# Patient Record
Sex: Male | Born: 1960 | Race: White | Hispanic: No | Marital: Married | Smoking: Never smoker
Health system: Southern US, Community
[De-identification: ages and names within clinical notes are randomized; demographics above are authoritative.]

## PROBLEM LIST (undated history)

## (undated) DIAGNOSIS — N1330 Unspecified hydronephrosis: Secondary | ICD-10-CM

## (undated) DIAGNOSIS — R002 Palpitations: Secondary | ICD-10-CM

## (undated) DIAGNOSIS — B379 Candidiasis, unspecified: Secondary | ICD-10-CM

## (undated) DIAGNOSIS — K219 Gastro-esophageal reflux disease without esophagitis: Secondary | ICD-10-CM

## (undated) DIAGNOSIS — R0600 Dyspnea, unspecified: Secondary | ICD-10-CM

## (undated) DIAGNOSIS — E349 Endocrine disorder, unspecified: Secondary | ICD-10-CM

## (undated) DIAGNOSIS — T7840XA Allergy, unspecified, initial encounter: Secondary | ICD-10-CM

## (undated) DIAGNOSIS — I10 Essential (primary) hypertension: Secondary | ICD-10-CM

## (undated) DIAGNOSIS — N200 Calculus of kidney: Secondary | ICD-10-CM

## (undated) DIAGNOSIS — E669 Obesity, unspecified: Secondary | ICD-10-CM

## (undated) DIAGNOSIS — G4730 Sleep apnea, unspecified: Secondary | ICD-10-CM

## (undated) DIAGNOSIS — I517 Cardiomegaly: Secondary | ICD-10-CM

## (undated) DIAGNOSIS — N529 Male erectile dysfunction, unspecified: Secondary | ICD-10-CM

## (undated) DIAGNOSIS — E785 Hyperlipidemia, unspecified: Secondary | ICD-10-CM

## (undated) DIAGNOSIS — N400 Enlarged prostate without lower urinary tract symptoms: Secondary | ICD-10-CM

## (undated) DIAGNOSIS — A6000 Herpesviral infection of urogenital system, unspecified: Secondary | ICD-10-CM

## (undated) DIAGNOSIS — Z9989 Dependence on other enabling machines and devices: Secondary | ICD-10-CM

## (undated) HISTORY — DX: Allergy, unspecified, initial encounter: T78.40XA

## (undated) HISTORY — DX: Essential (primary) hypertension: I10

## (undated) HISTORY — DX: Obesity, unspecified: E66.9

## (undated) HISTORY — DX: Sleep apnea, unspecified: G47.30

## (undated) HISTORY — DX: Male erectile dysfunction, unspecified: N52.9

## (undated) HISTORY — DX: Cardiomegaly: I51.7

## (undated) HISTORY — DX: Hyperlipidemia, unspecified: E78.5

## (undated) HISTORY — DX: Dyspnea, unspecified: R06.00

## (undated) HISTORY — DX: Benign prostatic hyperplasia without lower urinary tract symptoms: N40.0

## (undated) HISTORY — DX: Calculus of kidney: N20.0

## (undated) HISTORY — DX: Unspecified hydronephrosis: N13.30

## (undated) HISTORY — DX: Candidiasis, unspecified: B37.9

## (undated) HISTORY — DX: Dependence on other enabling machines and devices: Z99.89

## (undated) HISTORY — PX: CIRCUMCISION: SUR203

## (undated) HISTORY — PX: NASAL SEPTUM SURGERY: SHX37

## (undated) HISTORY — DX: Herpesviral infection of urogenital system, unspecified: A60.00

## (undated) HISTORY — DX: Palpitations: R00.2

## (undated) HISTORY — DX: Gastro-esophageal reflux disease without esophagitis: K21.9

## (undated) HISTORY — DX: Endocrine disorder, unspecified: E34.9

---

## 1999-02-01 ENCOUNTER — Ambulatory Visit (HOSPITAL_BASED_OUTPATIENT_CLINIC_OR_DEPARTMENT_OTHER): Admission: RE | Admit: 1999-02-01 | Discharge: 1999-02-01 | Payer: Self-pay | Admitting: Otolaryngology

## 2004-05-10 ENCOUNTER — Ambulatory Visit: Payer: Self-pay | Admitting: Internal Medicine

## 2004-06-18 ENCOUNTER — Ambulatory Visit: Payer: Self-pay | Admitting: Internal Medicine

## 2004-07-16 ENCOUNTER — Ambulatory Visit: Payer: Self-pay | Admitting: Internal Medicine

## 2004-11-04 ENCOUNTER — Ambulatory Visit: Payer: Self-pay | Admitting: Internal Medicine

## 2005-01-28 ENCOUNTER — Ambulatory Visit: Payer: Self-pay | Admitting: Internal Medicine

## 2005-02-04 ENCOUNTER — Ambulatory Visit: Payer: Self-pay | Admitting: Internal Medicine

## 2005-07-03 ENCOUNTER — Ambulatory Visit: Payer: Self-pay | Admitting: Internal Medicine

## 2005-08-25 ENCOUNTER — Ambulatory Visit: Payer: Self-pay | Admitting: Internal Medicine

## 2005-09-09 ENCOUNTER — Ambulatory Visit: Payer: Self-pay | Admitting: Internal Medicine

## 2005-09-16 ENCOUNTER — Ambulatory Visit: Payer: Self-pay | Admitting: Internal Medicine

## 2005-10-01 ENCOUNTER — Ambulatory Visit: Payer: Self-pay | Admitting: Gastroenterology

## 2005-10-13 ENCOUNTER — Ambulatory Visit: Payer: Self-pay | Admitting: Gastroenterology

## 2005-11-21 ENCOUNTER — Encounter: Admission: RE | Admit: 2005-11-21 | Discharge: 2005-12-22 | Payer: Self-pay | Admitting: Internal Medicine

## 2006-02-11 ENCOUNTER — Ambulatory Visit: Payer: Self-pay | Admitting: Internal Medicine

## 2006-02-11 LAB — CONVERTED CEMR LAB
Chol/HDL Ratio, serum: 7.5
Cholesterol: 217 mg/dL (ref 0–200)
HDL: 29.1 mg/dL — ABNORMAL LOW (ref >39.0)
LDL DIRECT: 147.3 mg/dL
Triglyceride fasting, serum: 165 mg/dL — ABNORMAL HIGH (ref 0–149)
VLDL: 33 mg/dL (ref 0–40)

## 2006-02-18 ENCOUNTER — Ambulatory Visit: Payer: Self-pay | Admitting: Internal Medicine

## 2006-04-08 ENCOUNTER — Ambulatory Visit: Payer: Self-pay | Admitting: Internal Medicine

## 2006-04-15 ENCOUNTER — Ambulatory Visit: Payer: Self-pay | Admitting: Internal Medicine

## 2006-04-15 LAB — CONVERTED CEMR LAB
ALT: 52 units/L — ABNORMAL HIGH (ref 0–40)
AST: 31 units/L (ref 0–37)
Albumin: 4.1 g/dL (ref 3.5–5.2)
Alkaline Phosphatase: 65 units/L (ref 39–117)
Bilirubin, Direct: 0.2 mg/dL (ref 0.0–0.3)
Chol/HDL Ratio, serum: 8
Cholesterol: 256 mg/dL (ref 0–200)
HDL: 32.2 mg/dL — ABNORMAL LOW (ref >39.0)
LDL DIRECT: 218.4 mg/dL
Total Bilirubin: 0.8 mg/dL (ref 0.3–1.2)
Total Protein: 7.5 g/dL (ref 6.0–8.3)
Triglyceride fasting, serum: 126 mg/dL (ref 0–149)
VLDL: 25 mg/dL (ref 0–40)

## 2006-04-23 ENCOUNTER — Ambulatory Visit: Payer: Self-pay | Admitting: Internal Medicine

## 2006-04-28 LAB — HM COLONOSCOPY

## 2006-09-17 ENCOUNTER — Ambulatory Visit: Payer: Self-pay | Admitting: Internal Medicine

## 2006-09-17 LAB — CONVERTED CEMR LAB
ALT: 47 units/L — ABNORMAL HIGH (ref 0–40)
AST: 28 units/L (ref 0–37)
Albumin: 4 g/dL (ref 3.5–5.2)
Alkaline Phosphatase: 94 units/L (ref 39–117)
Bilirubin, Direct: 0.2 mg/dL (ref 0.0–0.3)
Cholesterol: 234 mg/dL (ref 0–200)
Direct LDL: 151 mg/dL
HDL: 30.4 mg/dL — ABNORMAL LOW (ref >39.0)
Total Bilirubin: 0.7 mg/dL (ref 0.3–1.2)
Total CHOL/HDL Ratio: 7.7
Total Protein: 7.4 g/dL (ref 6.0–8.3)
Triglycerides: 293 mg/dL (ref 0–149)
VLDL: 59 mg/dL — ABNORMAL HIGH (ref 0–40)

## 2006-09-23 DIAGNOSIS — N400 Enlarged prostate without lower urinary tract symptoms: Secondary | ICD-10-CM | POA: Insufficient documentation

## 2006-09-23 DIAGNOSIS — E785 Hyperlipidemia, unspecified: Secondary | ICD-10-CM | POA: Insufficient documentation

## 2006-10-19 ENCOUNTER — Ambulatory Visit: Payer: Self-pay | Admitting: Internal Medicine

## 2007-03-10 ENCOUNTER — Ambulatory Visit: Payer: Self-pay | Admitting: Internal Medicine

## 2007-03-10 LAB — CONVERTED CEMR LAB
ALT: 46 units/L (ref 0–53)
AST: 32 units/L (ref 0–37)
Albumin: 4.2 g/dL (ref 3.5–5.2)
Alkaline Phosphatase: 83 units/L (ref 39–117)
Bilirubin, Direct: 0.2 mg/dL (ref 0.0–0.3)
Cholesterol: 242 mg/dL (ref 0–200)
Direct LDL: 184.4 mg/dL
HDL: 29.5 mg/dL — ABNORMAL LOW (ref >39.0)
Total Bilirubin: 1.1 mg/dL (ref 0.3–1.2)
Total CHOL/HDL Ratio: 8.2
Total Protein: 7.7 g/dL (ref 6.0–8.3)
Triglycerides: 184 mg/dL — ABNORMAL HIGH (ref 0–149)
VLDL: 37 mg/dL (ref 0–40)

## 2007-03-17 ENCOUNTER — Ambulatory Visit: Payer: Self-pay | Admitting: Internal Medicine

## 2007-03-17 LAB — CONVERTED CEMR LAB
Cholesterol, target level: 200 mg/dL
HDL goal, serum: 40 mg/dL
LDL Goal: 130 mg/dL

## 2007-04-14 ENCOUNTER — Ambulatory Visit: Payer: Self-pay | Admitting: Internal Medicine

## 2007-04-14 DIAGNOSIS — J069 Acute upper respiratory infection, unspecified: Secondary | ICD-10-CM | POA: Insufficient documentation

## 2007-06-18 ENCOUNTER — Ambulatory Visit: Payer: Self-pay | Admitting: Internal Medicine

## 2007-06-18 LAB — CONVERTED CEMR LAB
ALT: 47 units/L (ref 0–53)
AST: 28 units/L (ref 0–37)
Albumin: 4.1 g/dL (ref 3.5–5.2)
Alkaline Phosphatase: 86 units/L (ref 39–117)
Bilirubin, Direct: 0.2 mg/dL (ref 0.0–0.3)
Cholesterol: 133 mg/dL (ref 0–200)
HDL: 27.1 mg/dL — ABNORMAL LOW (ref >39.0)
LDL Cholesterol: 84 mg/dL (ref 0–99)
Total Bilirubin: 0.9 mg/dL (ref 0.3–1.2)
Total CHOL/HDL Ratio: 4.9
Total Protein: 7.3 g/dL (ref 6.0–8.3)
Triglycerides: 111 mg/dL (ref 0–149)
VLDL: 22 mg/dL (ref 0–40)

## 2007-06-25 ENCOUNTER — Ambulatory Visit: Payer: Self-pay | Admitting: Internal Medicine

## 2007-12-31 ENCOUNTER — Ambulatory Visit: Payer: Self-pay | Admitting: Internal Medicine

## 2007-12-31 LAB — CONVERTED CEMR LAB
ALT: 30 units/L (ref 0–53)
AST: 23 units/L (ref 0–37)
Albumin: 4.2 g/dL (ref 3.5–5.2)
Alkaline Phosphatase: 81 units/L (ref 39–117)
BUN: 14 mg/dL (ref 6–23)
Basophils Absolute: 0.1 10*3/uL (ref 0.0–0.1)
Basophils Relative: 0.9 % (ref 0.0–3.0)
Bilirubin Urine: NEGATIVE
Bilirubin, Direct: 0.1 mg/dL (ref 0.0–0.3)
Blood in Urine, dipstick: NEGATIVE
CO2: 30 meq/L (ref 19–32)
Calcium: 9.3 mg/dL (ref 8.4–10.5)
Chloride: 111 meq/L (ref 96–112)
Cholesterol: 123 mg/dL (ref 0–200)
Creatinine, Ser: 1.2 mg/dL (ref 0.4–1.5)
Eosinophils Absolute: 0.1 10*3/uL (ref 0.0–0.7)
Eosinophils Relative: 2.3 % (ref 0.0–5.0)
GFR calc Af Amer: 83 mL/min
GFR calc non Af Amer: 69 mL/min
Glucose, Bld: 90 mg/dL (ref 70–99)
Glucose, Urine, Semiquant: NEGATIVE
HCT: 43.9 % (ref 39.0–52.0)
HDL: 25.6 mg/dL — ABNORMAL LOW (ref >39.0)
Hemoglobin: 15.5 g/dL (ref 13.0–17.0)
Ketones, urine, test strip: NEGATIVE
LDL Cholesterol: 61 mg/dL (ref 0–99)
Lymphocytes Relative: 24.8 % (ref 12.0–46.0)
MCHC: 35.2 g/dL (ref 30.0–36.0)
MCV: 86.7 fL (ref 78.0–100.0)
Monocytes Absolute: 0.5 10*3/uL (ref 0.1–1.0)
Monocytes Relative: 7.9 % (ref 3.0–12.0)
Neutro Abs: 4 10*3/uL (ref 1.4–7.7)
Neutrophils Relative %: 64.1 % (ref 43.0–77.0)
Nitrite: NEGATIVE
PSA: 0.27 ng/mL (ref 0.10–4.00)
Platelets: 215 10*3/uL (ref 150–400)
Potassium: 4.3 meq/L (ref 3.5–5.1)
Protein, U semiquant: NEGATIVE
RBC: 5.06 M/uL (ref 4.22–5.81)
RDW: 12.3 % (ref 11.5–14.6)
Sodium: 145 meq/L (ref 135–145)
Specific Gravity, Urine: 1.02
TSH: 2.2 microintl units/mL (ref 0.35–5.50)
Total Bilirubin: 0.9 mg/dL (ref 0.3–1.2)
Total CHOL/HDL Ratio: 4.8
Total Protein: 7.6 g/dL (ref 6.0–8.3)
Triglycerides: 180 mg/dL — ABNORMAL HIGH (ref 0–149)
Urobilinogen, UA: 1
VLDL: 36 mg/dL (ref 0–40)
WBC Urine, dipstick: NEGATIVE
WBC: 6.2 10*3/uL (ref 4.5–10.5)
pH: 7

## 2008-01-10 ENCOUNTER — Ambulatory Visit: Payer: Self-pay | Admitting: Internal Medicine

## 2008-01-10 DIAGNOSIS — F528 Other sexual dysfunction not due to a substance or known physiological condition: Secondary | ICD-10-CM | POA: Insufficient documentation

## 2008-01-28 ENCOUNTER — Ambulatory Visit: Payer: Self-pay | Admitting: Internal Medicine

## 2008-01-28 DIAGNOSIS — E291 Testicular hypofunction: Secondary | ICD-10-CM | POA: Insufficient documentation

## 2008-02-01 ENCOUNTER — Ambulatory Visit: Payer: Self-pay | Admitting: Internal Medicine

## 2008-02-15 ENCOUNTER — Ambulatory Visit: Payer: Self-pay | Admitting: Internal Medicine

## 2008-02-29 ENCOUNTER — Ambulatory Visit: Payer: Self-pay | Admitting: Internal Medicine

## 2008-06-19 ENCOUNTER — Ambulatory Visit: Payer: Self-pay | Admitting: Internal Medicine

## 2008-06-19 LAB — CONVERTED CEMR LAB
ALT: 48 units/L (ref 0–53)
AST: 30 units/L (ref 0–37)
Albumin: 4.1 g/dL (ref 3.5–5.2)
Alkaline Phosphatase: 74 units/L (ref 39–117)
Bilirubin, Direct: 0.1 mg/dL (ref 0.0–0.3)
Cholesterol: 153 mg/dL (ref 0–200)
HDL: 27.3 mg/dL — ABNORMAL LOW (ref >39.0)
LDL Cholesterol: 93 mg/dL (ref 0–99)
Testosterone: 425.26 ng/dL (ref 350.00–890)
Total Bilirubin: 0.9 mg/dL (ref 0.3–1.2)
Total CHOL/HDL Ratio: 5.6
Total Protein: 7 g/dL (ref 6.0–8.3)
Triglycerides: 163 mg/dL — ABNORMAL HIGH (ref 0–149)
VLDL: 33 mg/dL (ref 0–40)

## 2008-06-27 ENCOUNTER — Ambulatory Visit: Payer: Self-pay | Admitting: Internal Medicine

## 2008-06-27 DIAGNOSIS — J309 Allergic rhinitis, unspecified: Secondary | ICD-10-CM | POA: Insufficient documentation

## 2008-08-29 ENCOUNTER — Ambulatory Visit: Payer: Self-pay | Admitting: Internal Medicine

## 2008-08-29 DIAGNOSIS — G4730 Sleep apnea, unspecified: Secondary | ICD-10-CM

## 2008-08-29 DIAGNOSIS — G4710 Hypersomnia, unspecified: Secondary | ICD-10-CM | POA: Insufficient documentation

## 2008-10-02 ENCOUNTER — Encounter: Payer: Self-pay | Admitting: Internal Medicine

## 2008-11-08 ENCOUNTER — Ambulatory Visit (HOSPITAL_BASED_OUTPATIENT_CLINIC_OR_DEPARTMENT_OTHER): Admission: RE | Admit: 2008-11-08 | Discharge: 2008-11-08 | Payer: Self-pay | Admitting: Internal Medicine

## 2008-11-08 ENCOUNTER — Encounter: Payer: Self-pay | Admitting: Internal Medicine

## 2008-11-19 ENCOUNTER — Ambulatory Visit: Payer: Self-pay | Admitting: Pulmonary Disease

## 2008-11-27 ENCOUNTER — Ambulatory Visit: Payer: Self-pay | Admitting: Internal Medicine

## 2008-11-27 DIAGNOSIS — A6000 Herpesviral infection of urogenital system, unspecified: Secondary | ICD-10-CM | POA: Insufficient documentation

## 2008-12-06 ENCOUNTER — Ambulatory Visit: Payer: Self-pay | Admitting: Internal Medicine

## 2008-12-06 LAB — CONVERTED CEMR LAB: Herpes Simplex Vrs I&II-IgM Ab (EIA): 1.66 — ABNORMAL HIGH

## 2008-12-12 ENCOUNTER — Telehealth: Payer: Self-pay | Admitting: Internal Medicine

## 2009-03-15 ENCOUNTER — Ambulatory Visit: Payer: Self-pay | Admitting: Internal Medicine

## 2009-05-25 ENCOUNTER — Ambulatory Visit: Payer: Self-pay | Admitting: Internal Medicine

## 2009-05-25 DIAGNOSIS — J0100 Acute maxillary sinusitis, unspecified: Secondary | ICD-10-CM | POA: Insufficient documentation

## 2009-07-06 ENCOUNTER — Ambulatory Visit: Payer: Self-pay | Admitting: Internal Medicine

## 2009-07-06 DIAGNOSIS — N410 Acute prostatitis: Secondary | ICD-10-CM | POA: Insufficient documentation

## 2009-10-12 ENCOUNTER — Ambulatory Visit: Payer: Self-pay | Admitting: Internal Medicine

## 2009-10-12 DIAGNOSIS — B3749 Other urogenital candidiasis: Secondary | ICD-10-CM | POA: Insufficient documentation

## 2009-11-01 ENCOUNTER — Encounter: Payer: Self-pay | Admitting: Internal Medicine

## 2009-11-28 ENCOUNTER — Telehealth: Payer: Self-pay | Admitting: Internal Medicine

## 2009-11-28 ENCOUNTER — Ambulatory Visit: Payer: Self-pay | Admitting: Internal Medicine

## 2010-01-03 ENCOUNTER — Ambulatory Visit: Payer: Self-pay | Admitting: Internal Medicine

## 2010-01-03 LAB — CONVERTED CEMR LAB
ALT: 39 units/L (ref 0–53)
AST: 27 units/L (ref 0–37)
Albumin: 4.2 g/dL (ref 3.5–5.2)
Alkaline Phosphatase: 72 units/L (ref 39–117)
BUN: 13 mg/dL (ref 6–23)
Basophils Absolute: 0 10*3/uL (ref 0.0–0.1)
Basophils Relative: 0.4 % (ref 0.0–3.0)
Bilirubin Urine: NEGATIVE
Bilirubin, Direct: 0.2 mg/dL (ref 0.0–0.3)
Blood in Urine, dipstick: NEGATIVE
CO2: 28 meq/L (ref 19–32)
Calcium: 9.2 mg/dL (ref 8.4–10.5)
Chloride: 104 meq/L (ref 96–112)
Cholesterol: 170 mg/dL (ref 0–200)
Creatinine, Ser: 1.1 mg/dL (ref 0.4–1.5)
Eosinophils Absolute: 0.2 10*3/uL (ref 0.0–0.7)
Eosinophils Relative: 2.7 % (ref 0.0–5.0)
GFR calc non Af Amer: 79.38 mL/min (ref >60)
Glucose, Bld: 83 mg/dL (ref 70–99)
Glucose, Urine, Semiquant: NEGATIVE
HCT: 48.9 % (ref 39.0–52.0)
HDL: 32 mg/dL — ABNORMAL LOW (ref >39.00)
Hemoglobin: 16.5 g/dL (ref 13.0–17.0)
Ketones, urine, test strip: NEGATIVE
LDL Cholesterol: 101 mg/dL — ABNORMAL HIGH (ref 0–99)
Lymphocytes Relative: 21.4 % (ref 12.0–46.0)
Lymphs Abs: 1.7 10*3/uL (ref 0.7–4.0)
MCHC: 33.7 g/dL (ref 30.0–36.0)
MCV: 90.1 fL (ref 78.0–100.0)
Monocytes Absolute: 0.6 10*3/uL (ref 0.1–1.0)
Monocytes Relative: 7.5 % (ref 3.0–12.0)
Neutro Abs: 5.5 10*3/uL (ref 1.4–7.7)
Neutrophils Relative %: 68 % (ref 43.0–77.0)
Nitrite: NEGATIVE
PSA: 0.35 ng/mL (ref 0.10–4.00)
Platelets: 217 10*3/uL (ref 150.0–400.0)
Potassium: 4.7 meq/L (ref 3.5–5.1)
Protein, U semiquant: NEGATIVE
RBC: 5.43 M/uL (ref 4.22–5.81)
RDW: 14.1 % (ref 11.5–14.6)
Sodium: 142 meq/L (ref 135–145)
Specific Gravity, Urine: 1.025
TSH: 2.75 microintl units/mL (ref 0.35–5.50)
Total Bilirubin: 0.9 mg/dL (ref 0.3–1.2)
Total CHOL/HDL Ratio: 5
Total Protein: 7.2 g/dL (ref 6.0–8.3)
Triglycerides: 185 mg/dL — ABNORMAL HIGH (ref 0.0–149.0)
Urobilinogen, UA: 0.2
VLDL: 37 mg/dL (ref 0.0–40.0)
WBC Urine, dipstick: NEGATIVE
WBC: 8 10*3/uL (ref 4.5–10.5)
pH: 6

## 2010-01-11 ENCOUNTER — Ambulatory Visit: Payer: Self-pay | Admitting: Internal Medicine

## 2010-01-11 DIAGNOSIS — R0989 Other specified symptoms and signs involving the circulatory and respiratory systems: Secondary | ICD-10-CM | POA: Insufficient documentation

## 2010-01-11 DIAGNOSIS — R0609 Other forms of dyspnea: Secondary | ICD-10-CM | POA: Insufficient documentation

## 2010-01-11 LAB — HM COLONOSCOPY

## 2010-01-11 IMAGING — CR DG CHEST 2V
2 series · 2 of 2 positions shown · non-contrast
Comparison: None.

CLINICAL DATA: Dyspnea on exertion.  Nonsmoker

CHEST - 2 VIEW

[view not recorded (1 of 2)]
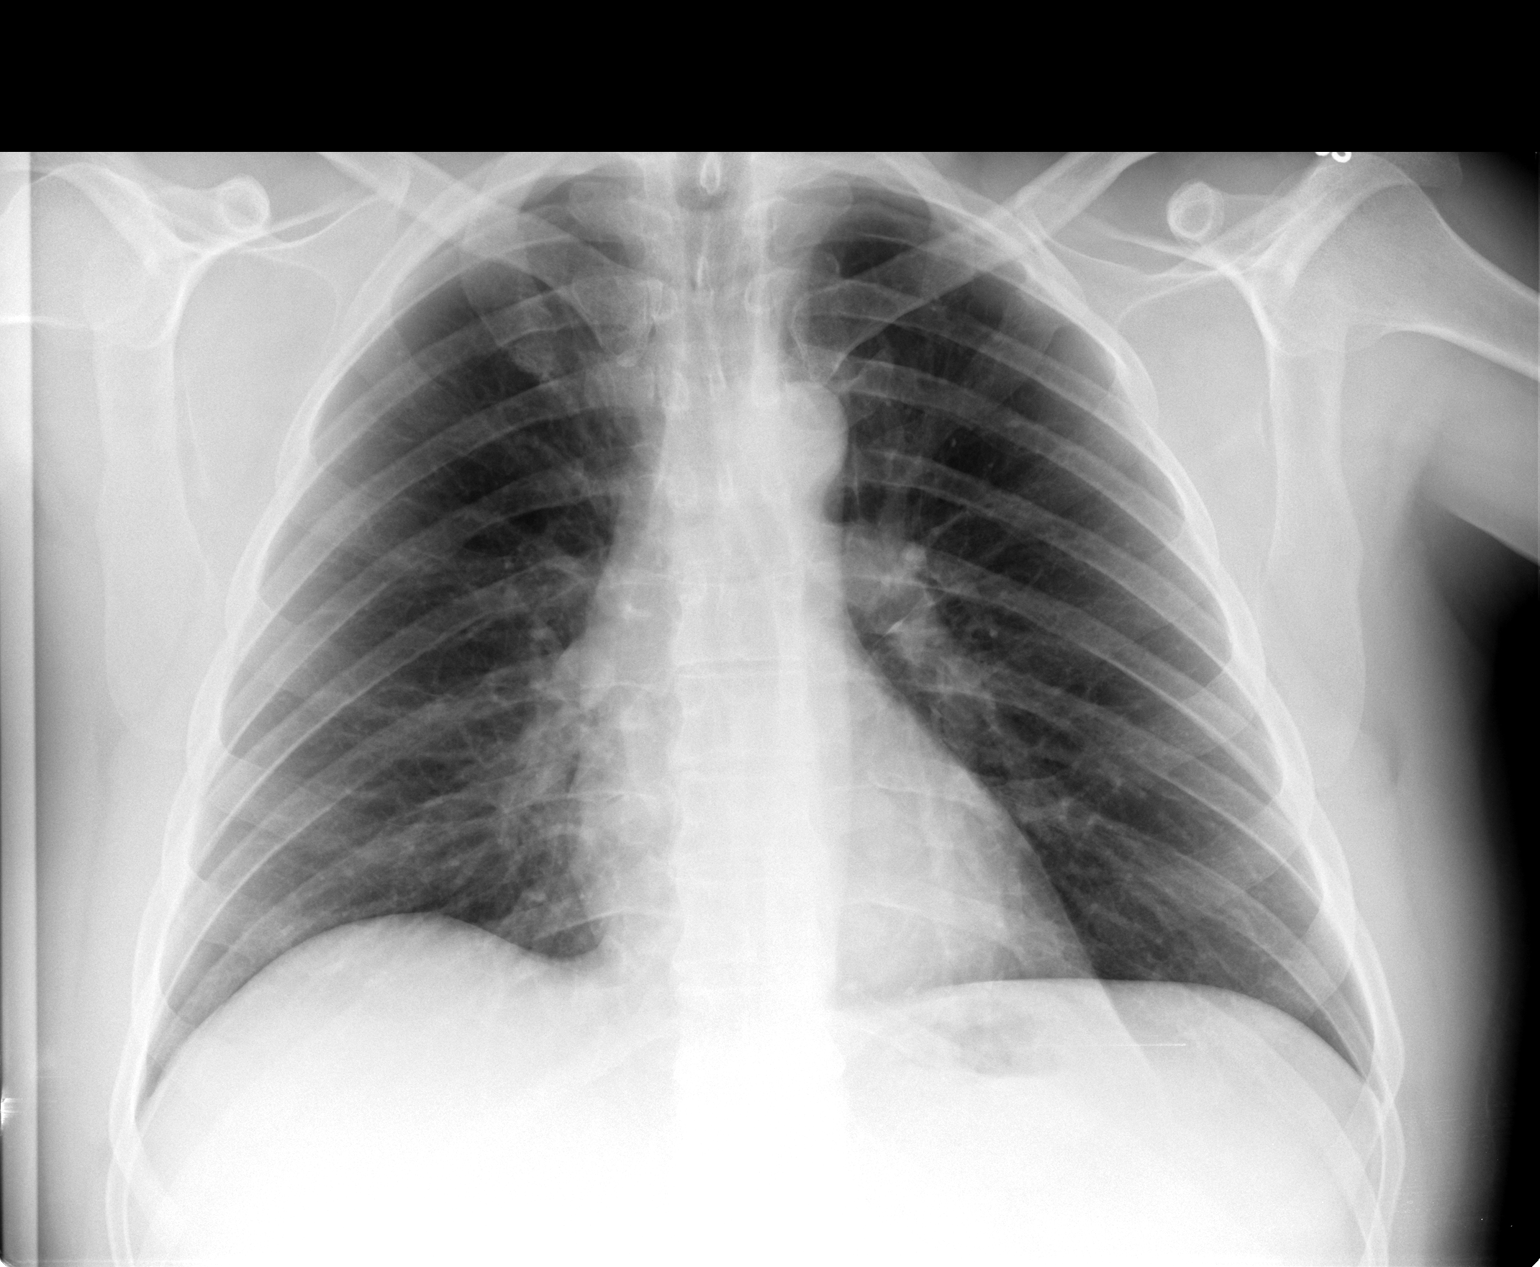

[view not recorded (2 of 2)]
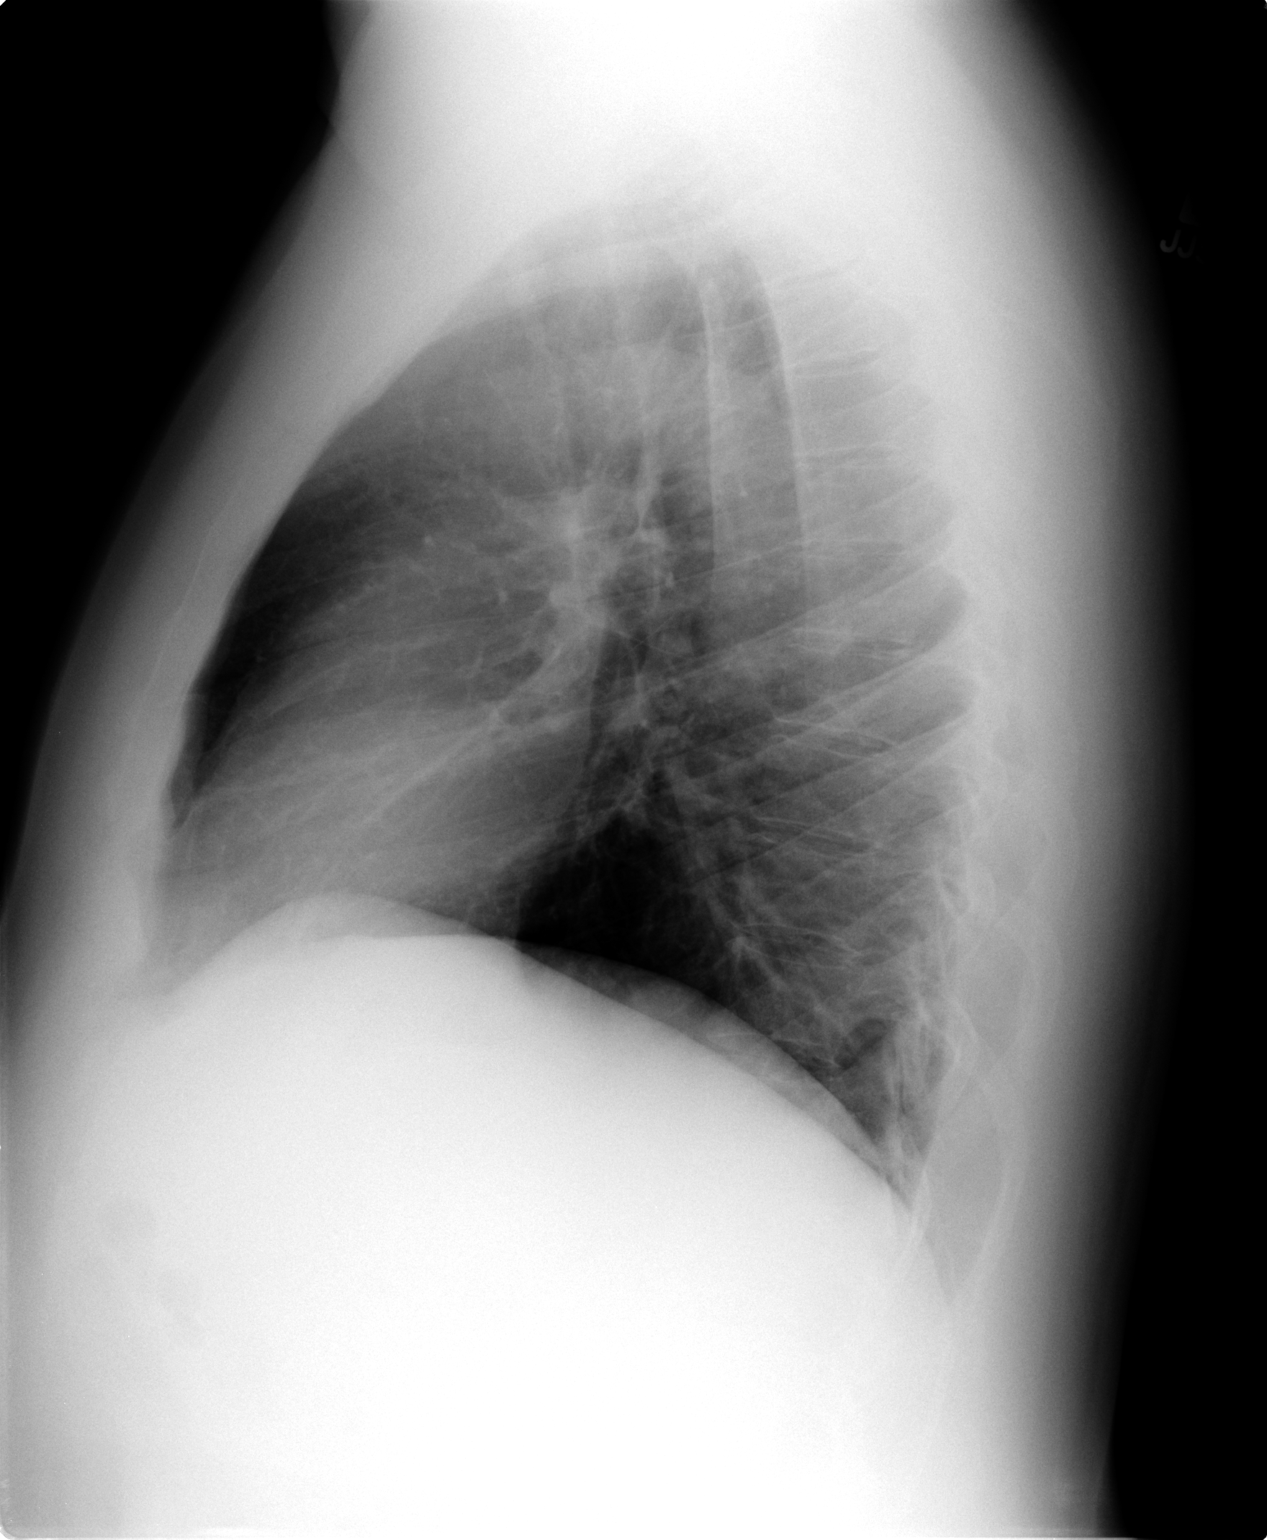

[2 of 2 positions shown; findings below may reference images not displayed]

FINDINGS: Heart and mediastinal contours are within normal limits.
The lung fields appear clear with no focal infiltrate or signs of
congestive failure.  No pleural fluid or significant peribronchial
cuffing is noted.

Bony structures appear intact.
IMPRESSION: No acute or focal cardiopulmonary abnormality noted.

## 2010-02-05 ENCOUNTER — Ambulatory Visit: Payer: Self-pay | Admitting: Cardiovascular Disease

## 2010-02-05 ENCOUNTER — Ambulatory Visit: Payer: Self-pay

## 2010-02-18 ENCOUNTER — Encounter: Payer: Self-pay | Admitting: Internal Medicine

## 2010-02-18 ENCOUNTER — Ambulatory Visit: Payer: Self-pay

## 2010-02-18 ENCOUNTER — Ambulatory Visit: Payer: Self-pay | Admitting: Cardiology

## 2010-02-18 ENCOUNTER — Ambulatory Visit (HOSPITAL_COMMUNITY): Admission: RE | Admit: 2010-02-18 | Discharge: 2010-02-18 | Payer: Self-pay | Admitting: Internal Medicine

## 2010-02-25 ENCOUNTER — Telehealth: Payer: Self-pay | Admitting: Internal Medicine

## 2010-03-13 ENCOUNTER — Ambulatory Visit: Payer: Self-pay | Admitting: Internal Medicine

## 2010-03-13 DIAGNOSIS — I517 Cardiomegaly: Secondary | ICD-10-CM | POA: Insufficient documentation

## 2010-03-13 DIAGNOSIS — G4733 Obstructive sleep apnea (adult) (pediatric): Secondary | ICD-10-CM | POA: Insufficient documentation

## 2010-03-27 ENCOUNTER — Encounter: Payer: Self-pay | Admitting: Internal Medicine

## 2010-04-10 ENCOUNTER — Ambulatory Visit: Payer: Self-pay | Admitting: Internal Medicine

## 2010-04-23 DIAGNOSIS — I10 Essential (primary) hypertension: Secondary | ICD-10-CM | POA: Insufficient documentation

## 2010-05-26 LAB — CONVERTED CEMR LAB: Testosterone: 271.76 ng/dL — ABNORMAL LOW (ref 350–890)

## 2010-05-30 NOTE — Progress Notes (Signed)
Summary: Prostate probs  Phone Note Call from Patient   Caller: Patient Call For: Stacie Glaze MD Summary of Call: Pt. wants to be seen for a prostate infecion.......same symptoms as usual. [id number] Initial call taken by: Lynann Beaver CMA,  November 28, 2009 8:19 AM  Follow-up for Phone Call        today at 130 Follow-up by: Stacie Glaze MD,  November 28, 2009 9:49 AM  Additional Follow-up for Phone Call Additional follow up Details #1::        pt informed Additional Follow-up by: Willy Eddy, LPN,  November 28, 2009 10:16 AM

## 2010-05-30 NOTE — Assessment & Plan Note (Signed)
Summary: personal/mhf   Vital Signs:  Patient profile:   51 year old male Height:      71 inches Weight:      206 pounds BMI:     28.84 Temp:     98.3 degrees F oral Pulse rate:   76 / minute Resp:     14 per minute BP sitting:   130 / 80  (left arm)  Vitals Entered By: Willy Eddy, LPN (November 27, 2008 11:03 AM)  CC:  c/o genital herpes flair up- not had in 10 + years.  History of Present Illness:  50 year old patient who has a history of recurrent genital herpes but has been quiescent for the past 12 years.  For the past week or two, he has had a couple of very mild outbreaks that have resolved spontaneously and quickly.  He has used acyclovir cream in the past.  Denies any fever or constitutional symptoms.  He has a history of ED and testosterone deficiency, which have been stable  Allergies: 1)  ! Pcn 2)  ! Crestor  Physical Exam  General:  Well-developed,well-nourished,in no acute distress; alert,appropriate and cooperative throughout examination Genitalia:  two small, shallow ulcers with some mild surrounding erythema about the penile shaft   Impression & Recommendations:  Problem # 1:  GENITAL HERPES (ICD-054.10) will give a prescription for acyclovir to fill if these outbreaks become more severe, frequent or longer lasting  Problem # 2:  TESTOSTERONE DEFICIENCY (ICD-257.2)  Complete Medication List: 1)  Finasteride 5 Mg Tabs (Finasteride) .... One by mouth daily 2)  Prilosec 20 Mg Cpdr (Omeprazole) .... One by mouth daily 3)  Vytorin 10-20 Mg Tabs (Ezetimibe-simvastatin) .... Take 1 tablet by mouth each day 4)  Testosterone Enanthate 200 Mg/ml Oil (Testosterone enanthate) .... 1/2 cc im every two week 5)  Bd Luer-lok Syringe 20g X 1" 3 Ml Misc (Syringe/needle (disp)) .... 1/2 ml of testosterone every 2 weeks 6)  Astepro 0.15 % Soln (Azelastine hcl) .... Two spray q nare q hs 7)  Nasonex 50 Mcg/act Susp (Mometasone furoate) .... Two spray q nare q hs 8)   Acyclovir 400 Mg Tabs (Acyclovir) .... One tablet 4 times daily for 3 days  Patient Instructions: 1)  use acyclovir as directed if needed Prescriptions: ACYCLOVIR 400 MG TABS (ACYCLOVIR) one tablet 4 times daily for 3 days  #24 x 2   Entered and Authorized by:   Gordy Savers  MD   Signed by:   Gordy Savers  MD on 11/27/2008   Method used:   Print then Give to Patient   RxID:   [id number]

## 2010-05-30 NOTE — Assessment & Plan Note (Signed)
Summary: testosterone inj/bonnye/mhf  Medications Added BD LUER-LOK SYRINGE 20G X 1" 3 ML MISC (SYRINGE/NEEDLE (DISP)) 1/2 ml of testosterone every 2 weeks       Nurse Visit    Prior Medications: FINASTERIDE 5 MG TABS (FINASTERIDE) one by mouth daily PRILOSEC 20 MG  CPDR (OMEPRAZOLE) one by mouth daily VYTORIN 10-20 MG TABS (EZETIMIBE-SIMVASTATIN) Take 1 tablet by mouth each day TESTOSTERONE ENANTHATE 200 MG/ML OIL (TESTOSTERONE ENANTHATE) 1/2 cc IM every two week Current Allergies: ! PCN ! CRESTOR    Medication Administration  Injection # 1:    Medication: tesosterone    Diagnosis: TESTOSTERONE DEFICIENCY (ICD-257.2)    Route: IM    Site: LUOQ gluteus    Exp Date: 01/26/2009    Lot #: [id number]    Mfr: paddock    Comments: pt brought own medication    Patient tolerated injection without complications    Given by: Willy Eddy, LPN (February 29, 2008 12:16 PM)  Orders Added: 1)  Admin of patients own med IM/SQ [[id number]]    Prescriptions: BD LUER-LOK SYRINGE 20G X 1" 3 ML MISC (SYRINGE/NEEDLE (DISP)) 1/2 ml of testosterone every 2 weeks  #20 x 1   Entered by:   Willy Eddy, LPN   Authorized by:   Stacie Glaze MD   Signed by:   Willy Eddy, LPN on 81/19/1478   Method used:   [facility] to        [facility] [phone]* (retail)       [address] [place], [place]  [address]       Ph: [phone] or [phone]       Fax: [phone]   RxID:   [id number]  ]

## 2010-05-30 NOTE — Assessment & Plan Note (Signed)
Summary: possible prostatitis/bmw   Vital Signs:  Patient profile:   50 year old male Height:      71 inches Weight:      206 pounds BMI:     28.84 Temp:     97.6 degrees F oral Pulse rate:   70 / minute BP sitting:   110 / 80  (left arm) Cuff size:   regular  Vitals Entered By: Kathrynn Speed CMA (November 28, 2009 1:27 PM) CC: possible prostatis, x 4 days, src Is Patient Diabetic? No   Primary Care Provider:  Stacie Glaze MD  CC:  possible prostatis, x 4 days, and src.  History of Present Illness: the pt has had recurent symptoms of prostatitis today he awoke with buring pain and back pain last flair was march he has not had fever or night sweats and has rectal pain last antibiotics were cipro and were efective had candidal rasj and the diflucan caused adominal cramps   Preventive Screening-Counseling & Management  Alcohol-Tobacco     Smoking Status: never  Problems Prior to Update: 1)  Candidiasis, Urogenital  (ICD-112.2) 2)  Prostatitis, Acute  (ICD-601.0) 3)  Acute Maxillary Sinusitis  (ICD-461.0) 4)  Genital Herpes  (ICD-054.10) 5)  Hypersomnia, Associated With Sleep Apnea  (ICD-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 One By Mouth Daily 2)  Prilosec 20 Mg  Cpdr (Omeprazole) .... One By Mouth Daily 3)  Vytorin 10-20 Mg Tabs (Ezetimibe-Simvastatin) .... Take 1 Tablet By Mouth Each Day 4)  Testosterone Enanthate 200 Mg/ml Oil (Testosterone Enanthate) .... 1/2 Cc Im Every Two Week 5)  Bd Luer-Lok Syringe 20g X 1" 3 Ml Misc (Syringe/needle (Disp)) .... 1/2 Ml of Testosterone Every 2 Weeks 6)  Valacyclovir  Hcl 500 Mg Tabs (Valacyclovir Hcl) .... One By Mouth Daily 7)  Clotrimazole-Betamethasone 1-0.05 % Crea (Clotrimazole-Betamethasone) .... Apply To Site Two Times A Day 8)  Fluconazole 100 Mg Tabs (Fluconazole) .... One By Mouth Daily For 10  Current Medications (verified): 1)  Finasteride 5 Mg Tabs (Finasteride) .... One By Mouth Daily 2)  Prilosec 20 Mg  Cpdr (Omeprazole) .... One By Mouth Daily 3)  Vytorin 10-20 Mg Tabs (Ezetimibe-Simvastatin) .... Take 1 Tablet By Mouth Each Day 4)  Testosterone Enanthate 200 Mg/ml Oil (Testosterone Enanthate) .... 1/2 Cc Im Every Two Week 5)  Bd Luer-Lok Syringe 20g X 1" 3 Ml Misc (Syringe/needle (Disp)) .... 1/2 Ml of Testosterone Every 2 Weeks 6)  Valacyclovir Hcl 500 Mg Tabs (Valacyclovir Hcl) .... One By Mouth Daily 7)  Ciprofloxacin Hcl 500 Mg Tabs (Ciprofloxacin Hcl) .... One By Mouth Two Times A Day  Allergies (verified): 1)  ! Pcn 2)  ! Crestor  Past History:  Family History: Last updated: 06/25/2007 Family History of Alcoholism/Addiction Family History Hypertension  Social History: Last updated: 03/17/2007 Married Never Smoked Alcohol use-no Drug use-no  Risk Factors: Smoking Status: never (11/28/2009)  Past medical, surgical, family and social histories (including risk factors) reviewed, and no changes noted (except as noted below).  Past Medical History: Reviewed history from 06/27/2008 and no changes required. Hyperlipidemia Benign prostatic hypertrophy Allergic rhinitis  Past Surgical History: Reviewed history  from 09/23/2006 and no changes required. Vasectomy  Family History: Reviewed history from 06/25/2007 and no changes required. Family History of Alcoholism/Addiction Family History Hypertension  Social History: Reviewed history from 03/17/2007 and no changes required. Married Never Smoked Alcohol use-no Drug use-no  Review of Systems  The patient denies anorexia, fever, weight loss, weight gain, vision  loss, decreased hearing, hoarseness, chest pain, syncope, dyspnea on exertion, peripheral edema, prolonged cough, headaches, hemoptysis, abdominal pain, melena, hematochezia, severe indigestion/heartburn, hematuria, incontinence, genital sores, muscle weakness, suspicious skin lesions, transient blindness, difficulty walking, depression, unusual weight change, abnormal bleeding, enlarged lymph nodes, angioedema, breast masses, and testicular masses.         dysuria  Physical Exam  General:  Well-developed,well-nourished,in no acute distress; alert,appropriate and cooperative throughout examination Head:  normocephalic and atraumatic.   Eyes:  pupils equal and pupils round.   Ears:  R ear normal and L ear normal.   Nose:  mucosal erythema, mucosal edema, and airflow obstruction.   Neck:  supple and full ROM.   Lungs:  normal respiratory effort and no wheezes.  increased uper bronchial sounds Heart:  normal rate and no murmur.   Abdomen:  soft, non-tender, and normal bowel sounds.   Rectal:  normal sphincter tone and internal hemorrhoid(s).   Prostate:  tender and boggy.     Impression & Recommendations:  Problem # 1:  PROSTATITIS, ACUTE (ICD-601.0) Assessment Deteriorated acute an chronic prostatitis counsilling about condition mild BPH and the frequesncy has been 2 time a year consider referral to urology ifworsens put for now will hold the vytorin until finishing the cipro I have spent greater that 30 min face to face evaluating this patient  Problem # 2:  BENIGN PROSTATIC HYPERTROPHY (ICD-600.00) Assessment: Improved  protate has reduced in size and requency of prostate pain has reduced His updated medication list for this problem includes:    Avodart 0.5 Mg Caps (Dutasteride) ..... One by mouth daily  PSA: 0.27 (12/31/2007)     Complete Medication List: 1)  Avodart 0.5 Mg Caps (Dutasteride) .... One by mouth daily 2)  Prilosec 20 Mg Cpdr (Omeprazole) .... One by mouth  daily 3)  Vytorin 10-20 Mg Tabs (Ezetimibe-simvastatin) .... Take 1 tablet by mouth each day 4)  Testosterone Enanthate 200 Mg/ml Oil (Testosterone enanthate) .... 1/2 cc im every two week 5)  Bd Luer-lok Syringe 20g X 1" 3 Ml Misc (Syringe/needle (disp)) .... 1/2 ml of testosterone every 2 weeks 6)  Valacyclovir Hcl 500 Mg Tabs (Valacyclovir hcl) .... One by mouth daily 7)  Ciprofloxacin Hcl 500 Mg Tabs (Ciprofloxacin hcl) .... One by mouth two times a day  Patient Instructions: 1)  note the change to avodart 2)  Take your antibiotic as prescribed until ALL of it is gone, but stop if you develop a rash or swelling and contact our office as soon as possible. Prescriptions: AVODART 0.5 MG CAPS (DUTASTERIDE) one by mouth daily  #30 x 11   Entered and Authorized by:   Stacie Glaze MD   Signed by:   Stacie Glaze MD on 11/28/2009   Method used:   [facility] to        [facility] [phone]* (retail)       [address] [place], [place]  [address]       Ph: [phone] or [phone]       Fax: [phone]   RxID:   [id number] CIPROFLOXACIN HCL  500 MG TABS (CIPROFLOXACIN HCL) one by mouth two times a day  #42 x 0   Entered and Authorized by:   Stacie Glaze MD   Signed by:   Stacie Glaze MD on 11/28/2009   Method used:   [facility]ronically to        [facility] [phone]* (retail)       [address] [place], [place]  [address]       Ph: [phone] or [phone]       Fax: [phone]   RxID:   [id number]

## 2010-05-30 NOTE — Assessment & Plan Note (Signed)
Summary: 2 month rov/njr   Vital Signs:  Patient profile:   50 year old male Height:      71 inches Weight:      206 pounds BMI:     28.84 Temp:     98.2 degrees F oral Pulse rate:   76 / minute Resp:     14 per minute BP sitting:   120 / 80  (left arm)  Vitals Entered By: Willy Eddy, LPN (Aug 30, 6438 8:25 AM)  CC:  roa.  History of Present Illness: allergy follow up with the astepro and the nasonex the sleep and snoring are better ran out of the nasonex last weeks does not have a air purifier in the bed room no hx of a formal sleep study ( wife note that allergies are worse)   Problems Prior to Update: 1)  Allergic Rhinitis  (ICD-477.9) 2)  Testosterone Deficiency  (ICD-257.2) 3)  Erectile Dysfunction  (ICD-302.72) 4)  Preventive Health Care  (ICD-V70.0) 5)  Family History of Alcoholism/addiction  (ICD-V61.41) 6)  Uri  (ICD-465.9) 7)  Benign Prostatic Hypertrophy  (ICD-600.00) 8)  Hyperlipidemia  (ICD-272.4)  Medications Prior to Update: 1)  Finasteride 5 Mg Tabs (Finasteride) .... One By Mouth Daily 2)  Prilosec 20 Mg  Cpdr (Omeprazole) .... One By Mouth Daily 3)  Vytorin 10-20 Mg Tabs (Ezetimibe-Simvastatin) .... Take 1 Tablet By Mouth Each Day 4)  Testosterone Enanthate 200 Mg/ml Oil (Testosterone Enanthate) .... 1/2 Cc Im Every Two Week 5)  Bd Luer-Lok Syringe 20g X 1" 3 Ml Misc (Syringe/needle (Disp)) .... 1/2 Ml of Testosterone Every 2 Weeks 6)  Astepro 0.15 % Soln (Azelastine Hcl) .... Two Spray Q Nare Q Hs 7)  Nasonex 50 Mcg/act Susp (Mometasone Furoate) .... Two Spray Q Nare Q Hs  Current Medications (verified): 1)  Finasteride 5 Mg Tabs (Finasteride) .... One By Mouth Daily 2)  Prilosec 20 Mg  Cpdr (Omeprazole) .... One By Mouth Daily 3)  Vytorin 10-20 Mg Tabs (Ezetimibe-Simvastatin) .... Take 1 Tablet By Mouth Each Day 4)  Testosterone Enanthate 200 Mg/ml Oil (Testosterone Enanthate) .... 1/2 Cc Im Every Two Week 5)  Bd Luer-Lok Syringe 20g X 1"  3 Ml Misc (Syringe/needle (Disp)) .... 1/2 Ml of Testosterone Every 2 Weeks 6)  Astepro 0.15 % Soln (Azelastine Hcl) .... Two Spray Q Nare Q Hs 7)  Nasonex 50 Mcg/act Susp (Mometasone Furoate) .... Two Spray Q Nare Q Hs  Allergies (verified): 1)  ! Pcn 2)  ! Crestor  Past History:  Family History:    Family History of Alcoholism/Addiction    Family History Hypertension     (06/25/2007)  Social History:    Married    Never Smoked    Alcohol use-no    Drug use-no     (03/17/2007)  Risk Factors:    Alcohol Use: N/A    >5 drinks/d w/in last 3 months: N/A    Caffeine Use: N/A    Diet: N/A    Exercise: N/A  Risk Factors:    Smoking Status: never (03/17/2007)    Packs/Day: N/A    Cigars/wk: N/A    Pipe Use/wk: N/A    Cans of tobacco/wk: N/A    Passive Smoke Exposure: N/A  Past medical, surgical, family and social histories (including risk factors) reviewed, and no changes noted (except as noted below).  Past Medical History:    Reviewed history from 06/27/2008 and no changes required:    Hyperlipidemia  Benign prostatic hypertrophy    Allergic rhinitis  Past Surgical History:    Reviewed history from 09/23/2006 and no changes required:    Vasectomy  Family History:    Reviewed history from 06/25/2007 and no changes required:       Family History of Alcoholism/Addiction       Family History Hypertension  Social History:    Reviewed history from 03/17/2007 and no changes required:       Married       Never Smoked       Alcohol use-no       Drug use-no  Review of Systems       The patient complains of hoarseness.  The patient denies anorexia, fever, weight loss, weight gain, vision loss, decreased hearing, chest pain, syncope, dyspnea on exertion, peripheral edema, prolonged cough, headaches, hemoptysis, abdominal pain, melena, hematochezia, severe indigestion/heartburn, hematuria, incontinence, genital sores, muscle weakness, suspicious skin lesions, transient  blindness, difficulty walking, depression, unusual weight change, abnormal bleeding, enlarged lymph nodes, angioedema, breast masses, and testicular masses.    Physical Exam  General:  alert, well-developed, well-nourished, and well-hydrated.   Head:  normocephalic and atraumatic.   Eyes:  vision grossly intact, pupils equal, pupils round, and pupils reactive to light.  Sclera and conjunctiva clear. Nose:  mucosal erythema, mucosal edema, and airflow obstruction.   Mouth:  Mild oropharyngeal injection, no exudates, petechiae or other lesions.   Neck:  No deformities, masses, or tenderness noted. Lungs:  Normal respiratory effort, chest expands symmetrically. Lungs are clear to auscultation, no crackles or wheezes. Heart:  normal rate, regular rhythm, no murmur, and no gallop.   Abdomen:  Bowel sounds positive,abdomen soft and non-tender without masses, organomegaly or hernias noted. Prostate:  no nodules, no asymmetry, and 2+ enlarged.     Impression & Recommendations:  Problem # 1:  ALLERGIC RHINITIS (ICD-477.9)  His updated medication list for this problem includes:    Astepro 0.15 % Soln (Azelastine hcl) .Marland Kitchen..Marland Kitchen Two spray q nare q hs    Nasonex 50 Mcg/act Susp (Mometasone furoate) .Marland Kitchen..Marland Kitchen Two spray q nare q hs  Discussed use of allergy medications and environmental measures.  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   Problem # 3:  HYPERLIPIDEMIA (ICD-272.4)  His updated medication list for this problem includes:    Vytorin 10-20 Mg Tabs (Ezetimibe-simvastatin) .Marland Kitchen... Take 1 tablet by mouth each day  Labs Reviewed: SGOT: 30 (06/19/2008)   SGPT: 48 (06/19/2008)  Lipid Goals: Chol Goal: 200 (03/17/2007)   HDL Goal: 40 (03/17/2007)   LDL Goal: 130 (03/17/2007)   TG Goal: 150 (03/17/2007)  Prior 10  Yr Risk Heart Disease: 4 % (06/25/2007)   HDL:27.3 (06/19/2008), 25.6 (12/31/2007)  LDL:93 (06/19/2008), 61 (12/31/2007)  Chol:153 (06/19/2008), 123 (12/31/2007)  Trig:163 (06/19/2008), 180 (12/31/2007)  Complete Medication List: 1)  Finasteride 5 Mg Tabs (Finasteride) .... One by mouth daily 2)  Prilosec 20 Mg Cpdr (Omeprazole) .... One by mouth daily 3)  Vytorin 10-20 Mg Tabs (Ezetimibe-simvastatin) .... Take 1 tablet by mouth each day 4)  Testosterone Enanthate 200 Mg/ml Oil (Testosterone enanthate) .... 1/2 cc im every two week 5)  Bd Luer-lok Syringe 20g X 1" 3 Ml Misc (Syringe/needle (disp)) .... 1/2 ml of testosterone every 2 weeks 6)  Astepro 0.15 % Soln (Azelastine hcl) .... Two  spray q nare q hs 7)  Nasonex 50 Mcg/act Susp (Mometasone furoate) .... Two spray q nare q hs  Other Orders: Sleep Disorder Referral (Sleep Disorder)  Patient Instructions: 1)  Please schedule a follow-up appointment in 6 weeks. 2)  dash diet 3)  [email or web address] Prescriptions: VYTORIN 10-20 MG TABS (EZETIMIBE-SIMVASTATIN) Take 1 tablet by mouth each day  #30 x 11   Entered and Authorized by:   Stacie Glaze MD   Signed by:   Stacie Glaze MD on 08/29/2008   Method used:   [facility] to        [facility] [phone]* (retail)       [address] [place], [place]  [address]       Ph: [phone] or [phone]       Fax: [phone]   RxID:   [id number]

## 2010-05-30 NOTE — Assessment & Plan Note (Signed)
Summary: cpx/njr rsc per bmp/njr   Vital Signs:  Patient Profile:   50 Years Old Male Weight:      201 pounds Temp:     98.5 degrees F oral Pulse rate:   76 / minute Resp:     14 per minute BP sitting:   120 / 80  (left arm)  Vitals Entered By: Willy Eddy, LPN (January 10, 2008 11:01 AM)                 Chief Complaint:  cpx-dt in 2004-colon 2007.  History of Present Illness: CPX    Current Allergies: ! PCN ! CRESTOR  Past Medical History:    Reviewed history from 09/23/2006 and no changes required:       Hyperlipidemia       Benign prostatic hypertrophy   Family History:    Reviewed history from 06/25/2007 and no changes required:       Family History of Alcoholism/Addiction       Family History Hypertension  Social History:    Reviewed history from 03/17/2007 and no changes required:       Married       Never Smoked       Alcohol use-no       Drug use-no   Risk Factors: Tobacco use:  never Drug use:  no Alcohol use:  no   Review of Systems  The patient denies anorexia, fever, weight loss, weight gain, vision loss, decreased hearing, hoarseness, chest pain, syncope, dyspnea on exertion, peripheral edema, prolonged cough, headaches, hemoptysis, abdominal pain, melena, hematochezia, severe indigestion/heartburn, hematuria, incontinence, genital sores, muscle weakness, suspicious skin lesions, transient blindness, difficulty walking, depression, unusual weight change, abnormal bleeding, enlarged lymph nodes, angioedema, breast masses, and testicular masses.     Physical Exam  General:     alert, well-developed, well-nourished, and well-hydrated.   Eyes:     vision grossly intact, pupils equal, pupils round, and pupils reactive to light.  Sclera and conjunctiva clear. Ears:     R ear normal and L ear normal.   Nose:     No discharge, erythema, edema Mouth:     Mild oropharyngeal injection, no exudates, petechiae or other lesions.   Neck:     No deformities, masses, or tenderness noted. Lungs:     Normal respiratory effort, chest expands symmetrically. Lungs are clear to auscultation, no crackles or wheezes. Heart:     normal rate, regular rhythm, no murmur, and no gallop.   Abdomen:     Bowel sounds positive,abdomen soft and non-tender without masses, organomegaly or hernias noted. Rectal:     No external abnormalities noted. Normal sphincter tone. No rectal masses or tenderness. Genitalia:     Testes bilaterally descended without nodularity, tenderness or masses. No scrotal masses or lesions. No penis lesions or urethral discharge. Prostate:     no nodules, no asymmetry, and 2+ enlarged.   Msk:     No deformity or scoliosis noted of thoracic or lumbar spine.   Pulses:     R and L carotid,radial,femoral,dorsalis pedis and posterior tibial pulses are full and equal bilaterally Extremities:     No clubbing, cyanosis, edema, or deformity noted with normal full range of motion of all joints.   Neurologic:     No cranial nerve deficits noted. Station and gait are normal. Plantar reflexes are down-going bilaterally. DTRs are symmetrical throughout. Sensory, motor and coordinative functions appear intact. Skin:     Intact without suspicious  lesions or rashes Cervical Nodes:     No lymphadenopathy noted Axillary Nodes:     No palpable lymphadenopathy Inguinal Nodes:     No significant adenopathy Psych:     Cognition and judgment appear intact. Alert and cooperative with normal attention span and concentration. No apparent delusions, illusions, hallucinations    Impression & Recommendations:  Problem # 1:  BENIGN PROSTATIC HYPERTROPHY (ICD-600.00)  Problem # 2:  HYPERLIPIDEMIA (ICD-272.4)  His updated medication list for this problem includes:    Vytorin 10-20 Mg Tabs (Ezetimibe-simvastatin) .Marland Kitchen... Take 1 tablet by mouth each day  Labs Reviewed: Chol: 123 (12/31/2007)   HDL: 25.6 (12/31/2007)   LDL: 61 (12/31/2007)    TG: 180 (12/31/2007) SGOT: 23 (12/31/2007)   SGPT: 30 (12/31/2007)  Lipid Goals: Chol Goal: 200 (03/17/2007)   HDL Goal: 40 (03/17/2007)   LDL Goal: 130 (03/17/2007)   TG Goal: 150 (03/17/2007)  Prior 10 Yr Risk Heart Disease: 4 % (06/25/2007)   Problem # 3:  HYPERLIPIDEMIA (ICD-272.4)  His updated medication list for this problem includes:    Vytorin 10-20 Mg Tabs (Ezetimibe-simvastatin) .Marland Kitchen... Take 1 tablet by mouth each day  Labs Reviewed: Chol: 123 (12/31/2007)   HDL: 25.6 (12/31/2007)   LDL: 61 (12/31/2007)   TG: 180 (12/31/2007) SGOT: 23 (12/31/2007)   SGPT: 30 (12/31/2007)  Lipid Goals: Chol Goal: 200 (03/17/2007)   HDL Goal: 40 (03/17/2007)   LDL Goal: 130 (03/17/2007)   TG Goal: 150 (03/17/2007)  Prior 10 Yr Risk Heart Disease: 4 % (06/25/2007)   Problem # 4:  PREVENTIVE HEALTH CARE (ICD-V70.0)  Reviewed preventive care protocols, scheduled due services, and updated immunizations.   Complete Medication List: 1)  Finasteride 5 Mg Tabs (Finasteride) .... One by mouth daily 2)  Prilosec 20 Mg Cpdr (Omeprazole) .... One by mouth daily 3)  Vytorin 10-20 Mg Tabs (Ezetimibe-simvastatin) .... Take 1 tablet by mouth each day   Patient Instructions: 1)  Please schedule a follow-up appointment in 6 months.   Prescriptions: VYTORIN 10-20 MG TABS (EZETIMIBE-SIMVASTATIN) Take 1 tablet by mouth each day  #30 x 10   Entered and Authorized by:   Stacie Glaze MD   Signed by:   Stacie Glaze MD on 01/10/2008   Method used:   Print then Give to Patient   RxID:   [id number] FINASTERIDE 5 MG TABS (FINASTERIDE) one by mouth daily  #30 x 11   Entered and Authorized by:   Stacie Glaze MD   Signed by:   Stacie Glaze MD on 01/10/2008   Method used:   Print then Give to Patient   RxID:   [id number]  ]  Appended Document: Orders Update     Clinical Lists Changes  Problems: Added new problem of ERECTILE DYSFUNCTION (ICD-302.72) Orders: Added new Service  order of Venipuncture (84696) - Signed Added new Test order of TLB-Testosterone, Total (84403-TESTO) - Signed

## 2010-05-30 NOTE — Progress Notes (Signed)
Summary: test results  Phone Note Call from Patient   Summary of Call: [phone]  Please call regarding test results. Initial call taken by: Lynann Beaver CMA,  December 12, 2008 8:15 AM  Follow-up for Phone Call        positive-pt informed- Follow-up by: Willy Eddy, LPN,  December 12, 2008 8:41 AM

## 2010-05-30 NOTE — Assessment & Plan Note (Signed)
Summary: CONGESTION,COUGH,BODYACHES // RS   Vital Signs:  Patient profile:   50 year old male Height:      71 inches Weight:      208 pounds BMI:     29.11 Temp:     98.6 degrees F oral Pulse rate:   76 / minute Resp:     14 per minute BP sitting:   130 / 80  (left arm)  Vitals Entered By: Willy Eddy, LPN (May 25, 2009 3:46 PM) CC: rectal bleedinc/o cough and congestion for 1 week-afebrile, URI symptoms   CC:  rectal bleedinc/o cough and congestion for 1 week-afebrile and URI symptoms.  History of Present Illness:  URI Symptoms      This is a 50 year old man who presents with URI symptoms.  In adition to the acute respiratory complaints he has noted hemorrhoidal bleeding with a hx of hemorrhoids.  The patient reports purulent nasal discharge and productive cough, but denies nasal congestion, clear nasal discharge, sore throat, dry cough, earache, and sick contacts.  The patient denies fever, low-grade fever (<100.5 degrees), fever of 100.5-103 degrees, fever of 103.1-104 degrees, fever to >104 degrees, stiff neck, dyspnea, wheezing, rash, vomiting, diarrhea, use of an antipyretic, and response to antipyretic.  The patient also reports headache.  The patient denies itchy watery eyes, itchy throat, sneezing, seasonal symptoms, response to antihistamine, muscle aches, and severe fatigue.  The patient denies the following risk factors for Strep sinusitis: unilateral facial pain, unilateral nasal discharge, poor response to decongestant, double sickening, tooth pain, Strep exposure, tender adenopathy, and absence of cough.    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) 5)  Allergic Rhinitis  (ICD-477.9) 6)  Testosterone Deficiency   (ICD-257.2) 7)  Erectile Dysfunction  (ICD-302.72) 8)  Preventive Health Care  (ICD-V70.0) 9)  Family History of Alcoholism/addiction  (ICD-V61.41) 10)  Uri  (ICD-465.9) 11)  Benign Prostatic Hypertrophy  (ICD-600.00) 12)  Hyperlipidemia  (ICD-272.4)  Current Problems (verified): 1)  Genital Herpes  (ICD-054.10) 2)  Hypersomnia, 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 One By Mouth Daily 2)  Prilosec 20 Mg  Cpdr (Omeprazole) .... One By Mouth Daily 3)  Vytorin 10-20 Mg Tabs (Ezetimibe-Simvastatin) .... Take 1 Tablet By Mouth Each Day 4)  Testosterone Enanthate 200 Mg/ml Oil (Testosterone Enanthate) .... 1/2 Cc Im Every Two Week 5)  Bd Luer-Lok Syringe 20g X 1" 3 Ml Misc (Syringe/needle (Disp)) .... 1/2 Ml of Testosterone Every 2 Weeks 6)  Astepro 0.15 % Soln (Azelastine Hcl) .... Two Spray Q Nare Q Hs 7)  Nasonex 50 Mcg/act Susp (Mometasone Furoate) .... Two Spray Q Nare Q Hs 8)  Valacyclovir Hcl 500 Mg Tabs (Valacyclovir Hcl) .... One By Mouth Daily  Current Medications (verified): 1)  Finasteride 5 Mg Tabs (Finasteride) .... One By Mouth Daily 2)  Prilosec 20 Mg  Cpdr (Omeprazole) .... One By Mouth Daily 3)  Vytorin 10-20 Mg Tabs (Ezetimibe-Simvastatin) .... Take 1 Tablet By Mouth Each Day 4)  Testosterone Enanthate 200 Mg/ml Oil (Testosterone Enanthate) .... 1/2 Cc Im Every Two Week 5)  Bd Luer-Lok Syringe 20g X 1" 3 Ml Misc (Syringe/needle (Disp)) .... 1/2 Ml of Testosterone Every 2 Weeks 6)  Valacyclovir Hcl 500 Mg Tabs (Valacyclovir Hcl) .... One By Mouth Daily 7)  Smz-Tmp Ds 800-160 Mg Tabs  (Sulfamethoxazole-Trimethoprim) .... One By Mouth Two Times A Day 8)  Phenylephrine-Bromphen-Dm 01-30-19 Mg/56ml Liqd (Phenylephrine-Bromphen-Dm) .... or Bromfed Dm Two Tsp By Mouth Q 6 Hours Prn  Allergies (verified): 1)  ! Pcn 2)  ! Crestor  Past History:  Family History: Last updated: 06/25/2007 Family History of Alcoholism/Addiction Family History Hypertension  Social History: Last updated: 03/17/2007 Married Never Smoked Alcohol use-no Drug use-no  Risk Factors: Smoking Status: never (05/25/2009)  Past medical, surgical, family and social histories (including risk factors) reviewed, and no changes noted (except as noted below).  Past Medical History: Reviewed history from 06/27/2008 and no changes required. Hyperlipidemia Benign prostatic hypertrophy Allergic rhinitis  Past Surgical History: Reviewed history from 09/23/2006 and no changes required. Vasectomy  Family History: Reviewed history from 06/25/2007 and no changes required. Family History of Alcoholism/Addiction Family History Hypertension  Social History: Reviewed history from 03/17/2007 and no changes required. Married Never Smoked Alcohol use-no Drug use-no  Review of Systems       The patient complains of hoarseness, prolonged cough, and headaches.  The patient denies anorexia, fever, weight loss, weight gain, vision loss, decreased hearing, chest pain, syncope, dyspnea on exertion, hemoptysis, abdominal pain, melena, hematochezia, severe indigestion/heartburn, hematuria, incontinence, genital sores, muscle weakness, suspicious skin lesions, transient blindness, difficulty walking, depression, unusual weight change, abnormal bleeding, enlarged lymph nodes, angioedema, breast masses, and testicular masses.    Physical Exam  General:  Well-developed,well-nourished,in no acute distress; alert,appropriate and cooperative throughout examination Head:  normocephalic and atraumatic.   Ears:  R ear  normal and L ear normal.   Nose:  mucosal erythema, mucosal edema, and airflow obstruction.   Mouth:  posterior lymphoid hypertrophy and postnasal drip.   Lungs:  normal respiratory effort and no wheezes.  increased uper bronchial sounds Heart:  normal rate and no murmur.   Abdomen:  Bowel sounds positive,abdomen soft and non-tender without masses, organomegaly or hernias noted. Rectal:  external hemorrhoid(s).     Impression & Recommendations:  Problem # 1:  ACUTE BRONCHITIS (ICD-466.0) Assessment New  Take antibiotics and other medications as directed. Encouraged to push clear liquids, get enough rest, and take acetaminophen as needed. To be seen in 5-7 days if no improvement, sooner if worse.  His updated medication list for this problem includes:    Smz-tmp Ds 800-160 Mg Tabs (Sulfamethoxazole-trimethoprim) ..... One by mouth two times a day    Phenylephrine-bromphen-dm 01-30-19 Mg/53ml Liqd (Phenylephrine-bromphen-dm) ..... Or bromfed dm two tsp by mouth q 6 hours prn  Problem # 2:  ACUTE MAXILLARY SINUSITIS (ICD-461.0) Assessment: New  The following medications were removed from the medication list:    Astepro 0.15 % Soln (Azelastine hcl) .Marland Kitchen..Marland Kitchen Two spray q nare q hs    Nasonex 50 Mcg/act Susp (Mometasone furoate) .Marland Kitchen..Marland Kitchen Two spray q nare q hs His updated medication list for this problem includes:    Smz-tmp Ds 800-160 Mg Tabs (Sulfamethoxazole-trimethoprim) ..... One by mouth two times a day    Phenylephrine-bromphen-dm 01-30-19 Mg/81ml Liqd (Phenylephrine-bromphen-dm) ..... Or bromfed dm two tsp by mouth q 6 hours prn  Instructed on treatment. Call if symptoms persist or worsen.   Complete Medication List: 1)  Finasteride 5 Mg Tabs (Finasteride) .... One by mouth daily 2)  Prilosec 20 Mg Cpdr (Omeprazole) .... One by mouth daily 3)  Vytorin 10-20 Mg Tabs (Ezetimibe-simvastatin) .... Take 1 tablet by mouth each day 4)  Testosterone Enanthate 200 Mg/ml Oil (Testosterone enanthate)  .... 1/2 cc im every two week 5)  Bd Luer-lok Syringe 20g X 1" 3 Ml Misc (Syringe/needle (disp)) .... 1/2 ml of testosterone every 2 weeks 6)  Valacyclovir Hcl 500 Mg Tabs (Valacyclovir hcl) .... One by mouth daily 7)  Smz-tmp Ds 800-160 Mg Tabs (Sulfamethoxazole-trimethoprim) .... One by mouth two times a day 8)  Phenylephrine-bromphen-dm 01-30-19 Mg/72ml Liqd (Phenylephrine-bromphen-dm) .... Or bromfed dm two tsp by mouth q 6 hours prn  Patient Instructions: 1)  Please schedule a follow-up appointment as needed. 2)  Take your antibiotic as prescribed until ALL of it is gone, but stop if you develop a rash or swelling and contact our office as soon as possible. 3)  anusol for henmorrhiods Prescriptions: PHENYLEPHRINE-BROMPHEN-DM 01-30-19 MG/5ML LIQD (PHENYLEPHRINE-BROMPHEN-DM) or bromfed DM two tsp by mouth q 6 hours prn  #6oz x 1   Entered and Authorized by:   Stacie Glaze MD   Signed by:   Stacie Glaze MD on 05/25/2009   Method used:   Electronically to        [facility] [phone]* (retail)       [address] [place], [place]  [address]       Ph: [phone] or [phone]       Fax: [phone]   RxID:   [id number] SMZ-TMP DS 800-160 MG TABS (SULFAMETHOXAZOLE-TRIMETHOPRIM) one by mouth two times a day  #20 x 0   Entered and Authorized by:   Stacie Glaze MD   Signed by:   Stacie Glaze MD on 05/25/2009   Method used:   [facility] to        [facility] [phone]* (retail)       [address] [place], [place]  [address]       Ph: [phone] or [phone]       Fax: [phone]   RxID:   [id number]

## 2010-05-30 NOTE — Assessment & Plan Note (Signed)
Summary: rash---ok per bonnye//ccm   Vital Signs:  Patient profile:   50 year old male Height:      71 inches Weight:      206 pounds BMI:     28.84 Temp:     98.2 degrees F oral Pulse rate:   72 / minute Resp:     14 per minute BP sitting:   120 / 70  (left arm)  Vitals Entered By: Willy Eddy, LPN (October 12, 2009 9:42 AM) CC: rectal bleedinc/o groin rash with itching   CC:  rectal bleedinc/o groin rash with itching.  History of Present Illness: RED PURITIC RASH WITH RAISED SAT. LESIONS NO dm MODERATED OBESITY hot weather initial self treatment with hydrocotizone possible exposure ot vaginal candidiasis  Preventive Screening-Counseling & Management  Alcohol-Tobacco     Smoking Status: never  Problems Prior to Update: 1)  Prostatitis, Acute  (ICD-601.0) 2)  Acute Maxillary Sinusitis  (ICD-461.0) 3)  Genital Herpes  (ICD-054.10) 4)  Hypersomnia, Associated With Sleep Apnea  (ICD-780.53) 5)  Allergic Rhinitis  (ICD-477.9) 6)  Testosterone Deficiency  (ICD-257.2) 7)  Erectile Dysfunction  (ICD-302.72) 8)  Preventive Health Care  (ICD-V70.0) 9)  Family History of Alcoholism/addiction  (ICD-V61.41) 10)  Uri  (ICD-465.9) 11)  Benign Prostatic Hypertrophy  (ICD-600.00) 12)  Hyperlipidemia  (ICD-272.4)  Current Problems (verified): 1)  Prostatitis, Acute  (ICD-601.0) 2)  Acute Maxillary Sinusitis  (ICD-461.0) 3)  Genital Herpes  (ICD-054.10) 4)  Hypersomnia, Associated With Sleep Apnea  (ICD-780.53) 5)  Allergic Rhinitis  (ICD-477.9) 6)  Testosterone Deficiency  (ICD-257.2) 7)  Erectile Dysfunction  (ICD-302.72) 8)  Preventive Health Care  (ICD-V70.0) 9)  Family History of Alcoholism/addiction  (ICD-V61.41) 10)  Uri  (ICD-465.9) 11)  Benign Prostatic Hypertrophy  (ICD-600.00) 12)  Hyperlipidemia  (ICD-272.4)  Medications Prior to Update: 1)  Finasteride 5 Mg Tabs (Finasteride) .... One By Mouth Daily 2)  Prilosec 20 Mg  Cpdr (Omeprazole) .... One By Mouth  Daily 3)  Vytorin 10-20 Mg Tabs (Ezetimibe-Simvastatin) .... Take 1 Tablet By Mouth Each Day 4)  Testosterone Enanthate 200 Mg/ml Oil (Testosterone Enanthate) .... 1/2 Cc Im Every Two Week 5)  Bd Luer-Lok Syringe 20g X 1" 3 Ml Misc (Syringe/needle (Disp)) .... 1/2 Ml of Testosterone Every 2 Weeks 6)  Valacyclovir Hcl 500 Mg Tabs (Valacyclovir Hcl) .... One By Mouth Daily  Current Medications (verified): 1)  Finasteride 5 Mg Tabs (Finasteride) .... One By Mouth Daily 2)  Prilosec 20 Mg  Cpdr (Omeprazole) .... One By Mouth Daily 3)  Vytorin 10-20 Mg Tabs (Ezetimibe-Simvastatin) .... Take 1 Tablet By Mouth Each Day 4)  Testosterone Enanthate 200 Mg/ml Oil (Testosterone Enanthate) .... 1/2 Cc Im Every Two Week 5)  Bd Luer-Lok Syringe 20g X 1" 3 Ml Misc (Syringe/needle (Disp)) .... 1/2 Ml of Testosterone Every 2 Weeks 6)  Valacyclovir Hcl 500 Mg Tabs (Valacyclovir Hcl) .... One By Mouth Daily 7)  Clotrimazole-Betamethasone 1-0.05 % Crea (Clotrimazole-Betamethasone) .... Apply To Site Two Times A Day 8)  Fluconazole 100 Mg Tabs (Fluconazole) .... One By Mouth Daily For 10  Allergies (verified): 1)  ! Pcn 2)  ! Crestor  Past History:  Family History: Last updated: 06/25/2007 Family History of Alcoholism/Addiction Family History Hypertension  Social History: Last updated: 03/17/2007 Married Never Smoked Alcohol use-no Drug use-no  Risk Factors: Smoking Status: never (10/12/2009)  Past medical, surgical, family and social histories (including risk factors) reviewed, and no changes noted (except as noted below).  Past  Medical History: Reviewed history from 06/27/2008 and no changes required. Hyperlipidemia Benign prostatic hypertrophy Allergic rhinitis  Past Surgical History: Reviewed history from 09/23/2006 and no changes required. Vasectomy  Family History: Reviewed history from 06/25/2007 and no changes required. Family History of Alcoholism/Addiction Family History  Hypertension  Social History: Reviewed history from 03/17/2007 and no changes required. Married Never Smoked Alcohol use-no Drug use-no  Review of Systems  The patient denies anorexia, fever, weight loss, weight gain, vision loss, decreased hearing, hoarseness, chest pain, syncope, dyspnea on exertion, peripheral edema, prolonged cough, headaches, hemoptysis, abdominal pain, melena, hematochezia, severe indigestion/heartburn, hematuria, incontinence, genital sores, muscle weakness, suspicious skin lesions, transient blindness, difficulty walking, depression, unusual weight change, abnormal bleeding, enlarged lymph nodes, angioedema, and breast masses.    Physical Exam  General:  Well-developed,well-nourished,in no acute distress; alert,appropriate and cooperative throughout examination Head:  normocephalic and atraumatic.   Eyes:  pupils equal and pupils round.   Ears:  R ear normal and L ear normal.   Nose:  mucosal erythema, mucosal edema, and airflow obstruction.   Mouth:  posterior lymphoid hypertrophy and postnasal drip.   Neck:  supple and full ROM.   Lungs:  normal respiratory effort and no wheezes.  increased uper bronchial sounds Heart:  normal rate and no murmur.   Abdomen:  Bowel sounds positive,abdomen soft and non-tender without masses, organomegaly or hernias noted. Msk:  No deformity or scoliosis noted of thoracic or lumbar spine.   Extremities:  No clubbing, cyanosis, edema, or deformity noted with normal full range of motion of all joints.   Neurologic:  No cranial nerve deficits noted. Station and gait are normal. Plantar reflexes are down-going bilaterally. DTRs are symmetrical throughout. Sensory, motor and coordinative functions appear intact.   Impression & Recommendations:  Problem # 1:  CANDIDIASIS, UROGENITAL (ICD-112.2) cream and pills due to persistnatc and the fact THATt he has used topical steroids SEE RX AND COUNSILLING I have spent greater that 30 min  face to face evaluating this patient  Problem # 2:  BENIGN PROSTATIC HYPERTROPHY (ICD-600.00)  NO RISK OF INCONTINANCE NOTED His updated medication list for this problem includes:    Finasteride 5 Mg Tabs (Finasteride) ..... One by mouth daily  PSA: 0.27 (12/31/2007)     Complete Medication List: 1)  Finasteride 5 Mg Tabs (Finasteride) .... One by mouth daily 2)  Prilosec 20 Mg Cpdr (Omeprazole) .... One by mouth daily 3)  Vytorin 10-20 Mg Tabs (Ezetimibe-simvastatin) .... Take 1 tablet by mouth each day 4)  Testosterone Enanthate 200 Mg/ml Oil (Testosterone enanthate) .... 1/2 cc im every two week 5)  Bd Luer-lok Syringe 20g X 1" 3 Ml Misc (Syringe/needle (disp)) .... 1/2 ml of testosterone every 2 weeks 6)  Valacyclovir Hcl 500 Mg Tabs (Valacyclovir hcl) .... One by mouth daily 7)  Clotrimazole-betamethasone 1-0.05 % Crea (Clotrimazole-betamethasone) .... Apply to site two times a day 8)  Fluconazole 100 Mg Tabs (Fluconazole) .... One by mouth daily for 10 Prescriptions: FLUCONAZOLE 100 MG TABS (FLUCONAZOLE) one by mouth daily for 10  #10 x 0   Entered and Authorized by:   Stacie Glaze MD   Signed by:   Stacie Glaze MD on 10/12/2009   Method used:   [facility]ron[facility] to        [facility] [phone]* (retail)       [address] [place], [place]  [address]  Ph: [phone] or [phone]       Fax: [phone]   RxID:   [id number] CLOTRIMAZOLE-BETAMETHASONE 1-0.05 % CREA (CLOTRIMAZOLE-BETAMETHASONE) apply to site two times a day  #60 x 0   Entered and Authorized by:   Stacie Glaze MD   Signed by:   Stacie Glaze MD on 10/12/2009   Method used:   Electronically to        [facility] [phone]* (retail)       [address] [place], [place]  [address]       Ph: [phone] or [phone]       Fax: [phone]   RxID:   [id number]

## 2010-05-30 NOTE — Assessment & Plan Note (Signed)
Summary: cpx/njr/pt rescd from bump//ccm   Vital Signs:  Patient profile:   50 year old male Height:      71 inches Weight:      206 pounds BMI:     28.84 Temp:     98.2 degrees F oral Pulse rate:   72 / minute Pulse rhythm:   regular Resp:     14 per minute BP sitting:   116 / 76  (left arm)  Vitals Entered By: Willy Eddy, LPN (January 11, 2010 9:37 AM) CC: cpx, Lipid Management Is Patient Diabetic? No   Primary Care Provider:  Stacie Glaze MD  CC:  cpx and Lipid Management.  History of Present Illness: The pt was asked about all immunizations, health maint. services that are appropriate to their age and was given guidance on diet exercize  and weight management  pt has concerns about fatigue and lack of indurance last 6-9 months get winded recongnized weight issue awakens "fatigued" on weekends sleeps to 9-10 oclock  no day time somnolence noted has felt bad enough to "cry" work satisfaction "good" never smoked and sleep study was normal  Lipid Management History:      Positive NCEP/ATP III risk factors include male age 61 years old or older and HDL cholesterol less than 40.  Negative NCEP/ATP III risk factors include non-diabetic, non-tobacco-user status, non-hypertensive, no ASHD (atherosclerotic heart disease), no prior stroke/TIA, no peripheral vascular disease, and no history of aortic aneurysm.     Preventive Screening-Counseling & Management  Alcohol-Tobacco     Smoking Status: never     Tobacco Counseling: not indicated; no tobacco use  Problems Prior to Update: 1)  Candidiasis, Urogenital  (ICD-112.2) 2)  Prostatitis, Acute  (ICD-601.0) 3)  Acute Maxillary Sinusitis  (ICD-461.0) 4)  Genital Herpes  (ICD-054.10) 5)  Hypersomnia, Associated With Sleep Apnea  (ICD-780.53) 6)  Allergic Rhinitis  (ICD-477.9) 7)  Testosterone Deficiency  (ICD-257.2) 8)  Erectile Dysfunction  (ICD-302.72) 9)  Preventive Health Care  (ICD-V70.0) 10)  Family  History of Alcoholism/addiction  (ICD-V61.41) 11)  Uri  (ICD-465.9) 12)  Benign Prostatic Hypertrophy  (ICD-600.00) 13)  Hyperlipidemia  (ICD-272.4)  Current Problems (verified): 1)  Candidiasis, Urogenital  (ICD-112.2) 2)  Prostatitis, Acute  (ICD-601.0) 3)  Acute Maxillary Sinusitis  (ICD-461.0) 4)  Genital Herpes  (ICD-054.10) 5)  Hypersomnia, Associated With Sleep Apnea  (ICD-780.53) 6)  Allergic Rhinitis  (ICD-477.9) 7)  Testosterone Deficiency  (ICD-257.2) 8)  Erectile Dysfunction  (ICD-302.72) 9)  Preventive Health Care  (ICD-V70.0) 10)  Family History of Alcoholism/addiction  (ICD-V61.41) 11)  Uri  (ICD-465.9) 12)  Benign Prostatic Hypertrophy  (ICD-600.00) 13)  Hyperlipidemia  (ICD-272.4)  Medications Prior to Update: 1)  Avodart 0.5 Mg Caps (Dutasteride) .... One By Mouth Daily 2)  Prilosec 20 Mg  Cpdr (Omeprazole) .... One By Mouth Daily 3)  Vytorin 10-20 Mg Tabs (Ezetimibe-Simvastatin) .... Take 1 Tablet By Mouth Each Day 4)  Testosterone Enanthate 200 Mg/ml Oil (Testosterone Enanthate) .... 1/2 Cc Im Every Two Week 5)  Bd Luer-Lok Syringe 20g X 1" 3 Ml Misc (Syringe/needle (Disp)) .... 1/2 Ml of Testosterone Every 2 Weeks 6)  Valacyclovir Hcl 500 Mg Tabs (Valacyclovir Hcl) .... One By Mouth Daily 7)  Ciprofloxacin Hcl 500 Mg Tabs (Ciprofloxacin Hcl) .... One By Mouth Two Times A Day  Current Medications (verified): 1)  Avodart 0.5 Mg Caps (Dutasteride) .... One By Mouth Daily 2)  Prilosec 20 Mg  Cpdr (Omeprazole) .... One By  Mouth Daily 3)  Vytorin 10-20 Mg Tabs (Ezetimibe-Simvastatin) .... Take 1 Tablet By Mouth Each Day 4)  Testosterone Enanthate 200 Mg/ml Oil (Testosterone Enanthate) .... 1/2 Cc Im Every Two Week 5)  Bd Luer-Lok Syringe 20g X 1" 3 Ml Misc (Syringe/needle (Disp)) .... 1/2 Ml of Testosterone Every 2 Weeks 6)  Valacyclovir Hcl 500 Mg Tabs (Valacyclovir Hcl) .... One By Mouth Daily  Allergies (verified): 1)  ! Pcn 2)  ! Crestor  Past  History:  Family History: Last updated: 06/25/2007 Family History of Alcoholism/Addiction Family History Hypertension  Social History: Last updated: 03/17/2007 Married Never Smoked Alcohol use-no Drug use-no  Risk Factors: Smoking Status: never (01/11/2010)  Past medical, surgical, family and social histories (including risk factors) reviewed, and no changes noted (except as noted below).  Past Medical History: Reviewed history from 06/27/2008 and no changes required. Hyperlipidemia Benign prostatic hypertrophy Allergic rhinitis  Past Surgical History: Reviewed history from 09/23/2006 and no changes required. Vasectomy  Family History: Reviewed history from 06/25/2007 and no changes required. Family History of Alcoholism/Addiction Family History Hypertension  Social History: Reviewed history from 03/17/2007 and no changes required. Married Never Smoked Alcohol use-no Drug use-no  Review of Systems  The patient denies anorexia, fever, weight loss, weight gain, vision loss, decreased hearing, hoarseness, chest pain, syncope, dyspnea on exertion, peripheral edema, prolonged cough, headaches, hemoptysis, abdominal pain, melena, hematochezia, severe indigestion/heartburn, hematuria, incontinence, genital sores, muscle weakness, suspicious skin lesions, transient blindness, difficulty walking, depression, unusual weight change, abnormal bleeding, enlarged lymph nodes, angioedema, and breast masses.    Physical Exam  General:  alert and underweight appearing.   Head:  normocephalic and atraumatic.   Eyes:  pupils equal and pupils round.   Ears:  R ear normal and L ear normal.   Nose:  mucosal erythema, mucosal edema, and airflow obstruction.   Mouth:  posterior lymphoid hypertrophy and postnasal drip.   Neck:  supple and full ROM.   Lungs:  normal respiratory effort and no wheezes.  increased uper bronchial sounds Heart:  normal rate and no murmur.   Abdomen:  soft,  non-tender, and normal bowel sounds.   Rectal:  normal sphincter tone and internal hemorrhoid(s).   Prostate:  tender and boggy.   Msk:  No deformity or scoliosis noted of thoracic or lumbar spine.   Extremities:  No clubbing, cyanosis, edema, or deformity noted with normal full range of motion of all joints.   Neurologic:  No cranial nerve deficits noted. Station and gait are normal. Plantar reflexes are down-going bilaterally. DTRs are symmetrical throughout. Sensory, motor and coordinative functions appear intact.   Impression & Recommendations:  Problem # 1:  PREVENTIVE HEALTH CARE (ICD-V70.0)  Colonoscopy: Diverticulosis (10/13/2005) Td Booster: Historical (12/30/2002)   Flu Vax: Fluvax 3+ (03/15/2009)   Chol: 170 (01/03/2010)   HDL: 32.00 (01/03/2010)   LDL: 101 (01/03/2010)   TG: 185.0 (01/03/2010) TSH: 2.75 (01/03/2010)   PSA: 0.35 (01/03/2010) Next Colonoscopy due:: 10/2012 (01/11/2010)  Discussed using sunscreen, use of alcohol, drug use, self testicular exam, routine dental care, routine eye care, routine physical exam, seat belts, multiple vitamins, osteoporosis prevention, adequate calcium intake in diet, and recommendations for immunizations.  Discussed exercise and checking cholesterol.  Discussed gun safety, safe sex, and contraception. Also recommend checking PSA.  Problem # 2:  DYSPNEA ON EXERTION (ICD-786.09) Assessment: New  progressive dyspnea with exercize over 6 months with family hx of CAD and hx of hyperlipidemia and centrapedal obesity referral forstress cardiolyte CXR recommend  weigth loss progressive conditoning ( walking) Recommended fluid and salt restriction.   Orders: Cardiology Referral (Cardiology) T-2 View CXR (71020TC)  Problem # 3:  HYPERLIPIDEMIA (ICD-272.4)  dysmetabolic criterior met His updated medication list for this problem includes:    Vytorin 10-20 Mg Tabs (Ezetimibe-simvastatin) .Marland Kitchen... Take 1 tablet by mouth each day  Labs  Reviewed: SGOT: 27 (01/03/2010)   SGPT: 39 (01/03/2010)  Lipid Goals: Chol Goal: 200 (03/17/2007)   HDL Goal: 40 (03/17/2007)   LDL Goal: 130 (03/17/2007)   TG Goal: 150 (03/17/2007)  10 Yr Risk Heart Disease: 9 % Prior 10 Yr Risk Heart Disease: 4 % (06/25/2007)   HDL:32.00 (01/03/2010), 27.3 (06/19/2008)  LDL:101 (01/03/2010), 93 (16/01/9603)  Chol:170 (01/03/2010), 153 (06/19/2008)  Trig:185.0 (01/03/2010), 163 (06/19/2008)  Orders: Cardiology Referral (Cardiology)  Complete Medication List: 1)  Avodart 0.5 Mg Caps (Dutasteride) .... One by mouth daily 2)  Prilosec 20 Mg Cpdr (Omeprazole) .... One by mouth daily 3)  Vytorin 10-20 Mg Tabs (Ezetimibe-simvastatin) .... Take 1 tablet by mouth each day 4)  Testosterone Enanthate 200 Mg/ml Oil (Testosterone enanthate) .... 1/2 cc im every two week 5)  Bd Luer-lok Syringe 20g X 1" 3 Ml Misc (Syringe/needle (disp)) .... 1/2 ml of testosterone every 2 weeks 6)  Valacyclovir Hcl 500 Mg Tabs (Valacyclovir hcl) .... One by mouth daily  Lipid Assessment/Plan:      Based on NCEP/ATP III, the patient's risk factor category is "2 or more risk factors and a calculated 10 year CAD risk of < 20%".  The patient's lipid goals are as follows: Total cholesterol goal is 200; LDL cholesterol goal is 130; HDL cholesterol goal is 40; Triglyceride goal is 150.  His LDL cholesterol goal has not been met.  Secondary causes for hyperlipidemia have been ruled out.  He has been counseled on adjunctive measures for lowering his cholesterol and has been provided with dietary instructions.        The patient's abdominal girth is 40 inches.  His BMI is calculated to be 28.84.  The patient has triglyceride level over 150 and an HDL less than 10 (male), but he does not meet the criteria for dysmetabolic syndrome.    Patient Instructions: 1)  Please schedule a follow-up appointment in 3 months. 2)  It is important that you exercise regularly at least 30 minutes 3 times a week.  If you develop chest pain, have severe difficulty breathing, or feel very tired , stop exercising immediately and seek medical attention. 3)  You need to lose weight. Consider a lower calorie diet and regular exercise.    Immunization History:  Tetanus/Td Immunization History:    Tetanus/Td:  historical (12/30/2002)    Preventive Care Screening  Colonoscopy:    Date:  10/13/2005    Next Due:  10/2012    Results:  Diverticulosis   Last Tetanus Booster:    Date:  12/30/2002    Results:  Historical

## 2010-05-30 NOTE — Assessment & Plan Note (Signed)
Summary: roa/db/pt rescd/ccm   Vital Signs:  Patient Profile:   50 Years Old Male Weight:      194 pounds Temp:     98.3 degrees F oral Pulse rate:   78 / minute Pulse rhythm:   regular Resp:     12 per minute BP sitting:   124 / 80  Vitals Entered By: Lynann Beaver CMA (March 17, 2007 8:04 AM)                 Chief Complaint:  rov.  History of Present Illness: LIID follow up DM risks, low HDL BPH hx  Lipid Management History:      Positive NCEP/ATP III risk factors include male age 88 years old or older and HDL cholesterol less than 40.  Negative NCEP/ATP III risk factors include non-diabetic, non-tobacco-user status, non-hypertensive, no ASHD (atherosclerotic heart disease), no prior stroke/TIA, no peripheral vascular disease, and no history of aortic aneurysm.     Current Allergies (reviewed today): ! PCN ! CRESTOR Updated/Current Medications (including changes made in today's visit):  FINASTERIDE 5 MG TABS (FINASTERIDE) one by mouth daily PRILOSEC 20 MG  CPDR (OMEPRAZOLE) one by mouth daily VYTORIN 10-20 MG TABS (EZETIMIBE-SIMVASTATIN) Take 1 tablet by mouth each day   Past Medical History:    Reviewed history from 09/23/2006 and no changes required:       Hyperlipidemia       Benign prostatic hypertrophy  Past Surgical History:    Reviewed history from 09/23/2006 and no changes required:       Vasectomy   Family History:    Reviewed history and no changes required:  Social History:    Reviewed history and no changes required:       Married       Never Smoked       Alcohol use-no       Drug use-no   Risk Factors:  Tobacco use:  never Drug use:  no Alcohol use:  no   Review of Systems  The patient denies vision loss, decreased hearing, hoarseness, chest pain, dyspnea on exhertion, peripheral edema, prolonged cough, and abdominal pain.     Physical Exam  General:     Well-developed,well-nourished,in no acute distress; alert,appropriate  and cooperative throughout examination Head:     male-pattern balding.   Eyes:     No corneal or conjunctival inflammation noted. EOMI. Perrla. Funduscopic exam benign, without hemorrhages, exudates or papilledema. Vision grossly normal. Nose:     External nasal examination shows no deformity or inflammation. Nasal mucosa are pink and moist without lesions or exudates. Mouth:     Oral mucosa and oropharynx without lesions or exudates.  Teeth in good repair. Neck:     No deformities, masses, or tenderness noted. Lungs:     Normal respiratory effort, chest expands symmetrically. Lungs are clear to auscultation, no crackles or wheezes. Heart:     Normal rate and regular rhythm. S1 and S2 normal without gallop, murmur, click, rub or other extra sounds. Abdomen:     Bowel sounds positive,abdomen soft and non-tender without masses, organomegaly or hernias noted.    Impression & Recommendations:  Problem # 1:  HYPERLIPIDEMIA (ICD-272.4)  The following medications were removed from the medication list:    Zetia 10 Mg Tabs (Ezetimibe) ..... One by mouth daily  His updated medication list for this problem includes:    Vytorin 10-20 Mg Tabs (Ezetimibe-simvastatin) .Marland Kitchen... Take 1 tablet by mouth each day   Problem #  2:  BENIGN PROSTATIC HYPERTROPHY (ICD-600.00) protates is stable with proscar  Complete Medication List: 1)  Finasteride 5 Mg Tabs (Finasteride) .... One by mouth daily 2)  Prilosec 20 Mg Cpdr (Omeprazole) .... One by mouth daily 3)  Vytorin 10-20 Mg Tabs (Ezetimibe-simvastatin) .... Take 1 tablet by mouth each day  Lipid Assessment/Plan:      Based on NCEP/ATP III, the patient's risk factor category is "2 or more risk factors and a calculated 10 year CAD risk of < 20%".  From this information, the patient's calculated lipid goals are as follows: Total cholesterol goal is 200; LDL cholesterol goal is 130; HDL cholesterol goal is 40; Triglyceride goal is 150.  His LDL cholesterol  goal has not been met.  Secondary causes for hyperlipidemia have been ruled out.  He has been counseled on adjunctive measures for lowering his cholesterol and has been provided with dietary instructions.     Patient Instructions: 1)  Please schedule a follow-up appointment in 10 weeks. 2)  Hepatic Panel prior to visit, ICD-9:  995.20 3)  Lipid Panel prior to visit, ICD-9:272.4    Prescriptions: VYTORIN 10-20 MG TABS (EZETIMIBE-SIMVASTATIN) Take 1 tablet by mouth each day  #30 x 10   Entered and Authorized by:   Stacie Glaze MD   Signed by:   Stacie Glaze MD on 03/17/2007   Method used:   Print then Give to Patient   RxID:   [id number] FINASTERIDE 5 MG TABS (FINASTERIDE) one by mouth daily  #30 x 11   Entered and Authorized by:   Stacie Glaze MD   Signed by:   Stacie Glaze MD on 03/17/2007   Method used:   Print then Give to Patient   RxID:   [id number]  ]

## 2010-05-30 NOTE — Assessment & Plan Note (Signed)
Summary: W10A/FUP/RCD   Vital Signs:  Patient Profile:   50 Years Old Male Weight:      200 pounds Temp:     98.5 degrees F oral Pulse rate:   72 / minute Resp:     12 per minute BP sitting:   120 / 82  (left arm)  Vitals Entered By: Willy Eddy, LPN (June 25, 2007 10:43 AM)                 Chief Complaint:  ROA LBS.  History of Present Illness: Current Problems:  URI (ICD-465.9)   mild symptoms BENIGN PROSTATIC HYPERTROPHY (ICD-600.00)   chronic prostatism HYPERLIPIDEMIA (ICD-272.4)   monitering cholesterol      Lipid Management History:      Positive NCEP/ATP III risk factors include male age 30 years old or older and HDL cholesterol less than 40.  Negative NCEP/ATP III risk factors include non-diabetic, non-tobacco-user status, non-hypertensive, no ASHD (atherosclerotic heart disease), no prior stroke/TIA, no peripheral vascular disease, and no history of aortic aneurysm.       Current Allergies: ! PCN ! CRESTOR  Past Medical History:    Reviewed history from 09/23/2006 and no changes required:       Hyperlipidemia       Benign prostatic hypertrophy  Past Surgical History:    Reviewed history from 09/23/2006 and no changes required:       Vasectomy   Family History:    Reviewed history and no changes required:       Family History of Alcoholism/Addiction       Family History Hypertension  Social History:    Reviewed history from 03/17/2007 and no changes required:       Married       Never Smoked       Alcohol use-no       Drug use-no    Review of Systems  The patient denies anorexia, fever, weight loss, weight gain, vision loss, decreased hearing, hoarseness, chest pain, syncope, dyspnea on exhertion, peripheral edema, prolonged cough, hemoptysis, abdominal pain, melena, hematochezia, severe indigestion/heartburn, hematuria, incontinence, and genital sores.     Physical Exam  General:     alert, well-developed, well-nourished, and  well-hydrated.   Head:     normocephalic and atraumatic.   Eyes:     vision grossly intact, pupils equal, pupils round, and pupils reactive to light.  Sclera and conjunctiva clear. Ears:     R ear normal and L ear normal.   Nose:     No discharge, erythema, edema Mouth:     Mild oropharyngeal injection, no exudates, petechiae or other lesions.   Neck:     No deformities, masses, or tenderness noted. Lungs:     Normal respiratory effort, chest expands symmetrically. Lungs are clear to auscultation, no crackles or wheezes. Heart:     normal rate, regular rhythm, no murmur, and no gallop.   Abdomen:     Bowel sounds positive,abdomen soft and non-tender without masses, organomegaly or hernias noted. Pulses:     R and L carotid,radial,femoral,dorsalis pedis and posterior tibial pulses are full and equal bilaterally Extremities:     No clubbing, cyanosis, edema, or deformity noted with normal full range of motion of all joints.   Neurologic:     alert & oriented X3, cranial nerves II-XII intact, and strength normal in all extremities.      Impression & Recommendations:  Problem # 1:  HYPERLIPIDEMIA (ICD-272.4)  His updated medication  list for this problem includes:    Vytorin 10-20 Mg Tabs (Ezetimibe-simvastatin) .Marland Kitchen... Take 1 tablet by mouth each day  Labs Reviewed: Chol: 133 (06/18/2007)   HDL: 27.1 (06/18/2007)   LDL: 84 (06/18/2007)   TG: 111 (06/18/2007) SGOT: 28 (06/18/2007)   SGPT: 47 (06/18/2007)  Lipid Goals: Chol Goal: 200 (03/17/2007)   HDL Goal: 40 (03/17/2007)   LDL Goal: 130 (03/17/2007)   TG Goal: 150 (03/17/2007)  Prior 10 Yr Risk Heart Disease: Not enough information (03/17/2007)   Problem # 2:  BENIGN PROSTATIC HYPERTROPHY (ICD-600.00)  Problem # 3:  URI (ICD-465.9) Assessment: Unchanged Instructed on symptomatic treatment. Call if symptoms persist or worsen.   Problem # 4:  FAMILY HISTORY OF ALCOHOLISM/ADDICTION (ICD-V61.41) Assessment:  Unchanged counsilling  Complete Medication List: 1)  Finasteride 5 Mg Tabs (Finasteride) .... One by mouth daily 2)  Prilosec 20 Mg Cpdr (Omeprazole) .... One by mouth daily 3)  Vytorin 10-20 Mg Tabs (Ezetimibe-simvastatin) .... Take 1 tablet by mouth each day  Lipid Assessment/Plan:      Based on NCEP/ATP III, the patient's risk factor category is "2 or more risk factors and a calculated 10 year CAD risk of < 20%".  From this information, the patient's calculated lipid goals are as follows: Total cholesterol goal is 200; LDL cholesterol goal is 130; HDL cholesterol goal is 40; Triglyceride goal is 150.  His LDL cholesterol goal has not been met.  Secondary causes for hyperlipidemia have been ruled out.  He has been counseled on adjunctive measures for lowering his cholesterol and has been provided with dietary instructions.     Patient Instructions: 1)  Please schedule a follow-up appointment in 6 months.   CPX welness 2)  BMP prior to visit, ICD-9: 3)  Hepatic Panel prior to visit, ICD-9: 4)  Lipid Panel prior to visit, ICD-9: 5)  TSH prior to visit, ICD-9:  v70.0 6)  CBC w/ Diff prior to visit, ICD-9: 7)  Urine-dip prior to visit, ICD-9: 8)  PSA prior to visit, ICD-9:    ]

## 2010-05-30 NOTE — Assessment & Plan Note (Signed)
Summary: discuss Testosterone injections/dm   Vital Signs:  Patient Profile:   50 Years Old Male Temp:     98.5 degrees F oral Pulse rate:   76 / minute Resp:     14 per minute BP sitting:   126 / 78  (left arm)  Vitals Entered By: Willy Eddy, LPN (January 28, 2008 4:07 PM)                 Chief Complaint:  discuss tesosterone level and tx.  History of Present Illness: shots vz creams/oinment weight loss and energy and drive and ED as well as drive  Follow-Up Visit      This is a 50 year old man who presents for Follow-up visit.  The patient denies chest pain, palpitations, dizziness, syncope, low blood sugar symptoms, high blood sugar symptoms, edema, SOB, DOE, PND, and orthopnea.  Since the last visit the patient notes no new problems or concerns.  The patient reports monitoring BP.  When questioned about possible medication side effects, the patient notes none.      Prior Medication List:  FINASTERIDE 5 MG TABS (FINASTERIDE) one by mouth daily PRILOSEC 20 MG  CPDR (OMEPRAZOLE) one by mouth daily VYTORIN 10-20 MG TABS (EZETIMIBE-SIMVASTATIN) Take 1 tablet by mouth each day   Current Allergies (reviewed today): ! PCN ! CRESTOR  Past Medical History:    Reviewed history from 09/23/2006 and no changes required:       Hyperlipidemia       Benign prostatic hypertrophy  Past Surgical History:    Reviewed history from 09/23/2006 and no changes required:       Vasectomy   Family History:    Reviewed history from 06/25/2007 and no changes required:       Family History of Alcoholism/Addiction       Family History Hypertension  Social History:    Reviewed history from 03/17/2007 and no changes required:       Married       Never Smoked       Alcohol use-no       Drug use-no   Risk Factors: Tobacco use:  never Drug use:  no Alcohol use:  no   Review of Systems  The patient denies anorexia, fever, weight loss, weight gain, vision loss, decreased  hearing, hoarseness, chest pain, syncope, dyspnea on exertion, peripheral edema, prolonged cough, headaches, hemoptysis, abdominal pain, melena, hematochezia, severe indigestion/heartburn, hematuria, incontinence, genital sores, muscle weakness, suspicious skin lesions, transient blindness, difficulty walking, depression, unusual weight change, abnormal bleeding, enlarged lymph nodes, angioedema, breast masses, and testicular masses.     Physical Exam  General:     alert, well-developed, well-nourished, and well-hydrated.   Head:     normocephalic and atraumatic.   Eyes:     vision grossly intact, pupils equal, pupils round, and pupils reactive to light.  Sclera and conjunctiva clear. Ears:     R ear normal and L ear normal.   Nose:     No discharge, erythema, edema Mouth:     Mild oropharyngeal injection, no exudates, petechiae or other lesions.   Neck:     No deformities, masses, or tenderness noted. Lungs:     Normal respiratory effort, chest expands symmetrically. Lungs are clear to auscultation, no crackles or wheezes. Heart:     normal rate, regular rhythm, no murmur, and no gallop.      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 Take 1 tablet by mouth each day  Labs Reviewed: Chol: 123 (12/31/2007)   HDL: 25.6 (12/31/2007)   LDL: 61 (12/31/2007)   TG: 180 (12/31/2007) SGOT: 23 (12/31/2007)   SGPT: 30 (12/31/2007)  Lipid Goals: Chol Goal: 200 (03/17/2007)   HDL Goal: 40 (03/17/2007)   LDL Goal: 130 (03/17/2007)   TG Goal: 150 (03/17/2007)  Prior 10 Yr Risk Heart Disease: 4 % (06/25/2007)   Problem # 3:  BENIGN PROSTATIC  HYPERTROPHY (ICD-600.00) this pt has experience inlarged prostat with chronic prostadynia... will moniter closely on testosterone replacement  Complete Medication List: 1)  Finasteride 5 Mg Tabs (Finasteride) .... One by mouth daily 2)  Prilosec 20 Mg Cpdr (Omeprazole) .... One by mouth daily 3)  Vytorin 10-20 Mg Tabs (Ezetimibe-simvastatin) .... Take 1 tablet by mouth each day 4)  Testosterone Enanthate 200 Mg/ml Oil (Testosterone enanthate) .... 1/2 cc im every two week 5)  Bd Luer-lok Syringe 20g X 1" 3 Ml Misc (Syringe/needle (disp)) .... 1/2 ml of testosterone every 2 weeks   Patient Instructions: 1)  Please schedule a follow-up appointment in 4 months. 2)  testosterone level ( 607.84)   Prescriptions: TESTOSTERONE ENANTHATE 200 MG/ML OIL (TESTOSTERONE ENANTHATE) 1/2 cc IM every two week  #10cc x 3   Entered and Authorized by:   Stacie Glaze MD   Signed by:   Stacie Glaze MD on 01/28/2008   Method used:   Print then Give to Patient   RxID:   [id number]  ]

## 2010-05-30 NOTE — Assessment & Plan Note (Signed)
Summary: testoserone inj/bonnye/mhf   Nurse Visit    Prior Medications: FINASTERIDE 5 MG TABS (FINASTERIDE) one by mouth daily PRILOSEC 20 MG  CPDR (OMEPRAZOLE) one by mouth daily VYTORIN 10-20 MG TABS (EZETIMIBE-SIMVASTATIN) Take 1 tablet by mouth each day TESTOSTERONE ENANTHATE 200 MG/ML OIL (TESTOSTERONE ENANTHATE) 1/2 cc IM every two week Current Allergies: ! PCN ! CRESTOR    Medication Administration  Injection # 1:    Medication: tesosterone    Diagnosis: TESTOSTERONE DEFICIENCY (ICD-257.2)    Route: IM    Site: RUOQ gluteus    Exp Date: 12/28/2009    Lot #: [id number]    Comments: pt receiver 1/2 ml--med is 200 mg /2 ml    Given by: Willy Eddy, LPN (February 01, 2008 12:18 PM)  Orders Added: 1)  Admin of patients own med IM/SQ [email or web address]    ]

## 2010-05-30 NOTE — Assessment & Plan Note (Signed)
Summary: 6 month follow up/mhf   Vital Signs:  Patient Profile:   50 Years Old Male Weight:      210 pounds Temp:     98.4 degrees F oral Pulse rate:   76 / minute Resp:     14 per minute BP sitting:   122 / 70  (left arm)  Vitals Entered By: Willy Eddy, LPN (June 27, 4032 8:09 AM)                 Chief Complaint:  roa labs.  History of Present Illness: androgen replacement for ED , testosterone levels  increased allergies and congestion at night  Lipid Management History:      Positive NCEP/ATP III risk factors include male age 72 years old or older and HDL cholesterol less than 40.  Negative NCEP/ATP III risk factors include non-diabetic, non-tobacco-user status, non-hypertensive, no ASHD (atherosclerotic heart disease), no prior stroke/TIA, no peripheral vascular disease, and no history of aortic aneurysm.        Prior Medication List:  FINASTERIDE 5 MG TABS (FINASTERIDE) one by mouth daily PRILOSEC 20 MG  CPDR (OMEPRAZOLE) one by mouth daily VYTORIN 10-20 MG TABS (EZETIMIBE-SIMVASTATIN) Take 1 tablet by mouth each day TESTOSTERONE ENANTHATE 200 MG/ML OIL (TESTOSTERONE ENANTHATE) 1/2 cc IM every two week BD LUER-LOK SYRINGE 20G X 1" 3 ML MISC (SYRINGE/NEEDLE (DISP)) 1/2 ml of testosterone every 2 weeks   Current Allergies (reviewed today): ! PCN ! CRESTOR  Past Medical History:    Reviewed history from 09/23/2006 and no changes required:       Hyperlipidemia       Benign prostatic hypertrophy       Allergic rhinitis  Past Surgical History:    Reviewed history from 09/23/2006 and no changes required:       Vasectomy   Family History:    Reviewed history from 06/25/2007 and no changes required:       Family History of Alcoholism/Addiction       Family History Hypertension  Social History:    Reviewed history from 03/17/2007 and no changes required:       Married       Never Smoked       Alcohol use-no       Drug use-no    Review of  Systems       The patient complains of hoarseness.  The patient denies anorexia, fever, weight loss, weight gain, vision loss, decreased hearing, chest pain, syncope, dyspnea on exertion, peripheral edema, prolonged cough, headaches, hemoptysis, abdominal pain, melena, hematochezia, severe indigestion/heartburn, hematuria, incontinence, genital sores, muscle weakness, suspicious skin lesions, transient blindness, difficulty walking, depression, unusual weight change, abnormal bleeding, enlarged lymph nodes, angioedema, breast masses, and testicular masses.     Physical Exam  General:     alert, well-developed, well-nourished, and well-hydrated.   Head:     normocephalic and atraumatic.   Eyes:     vision grossly intact, pupils equal, pupils round, and pupils reactive to light.  Sclera and conjunctiva clear. Ears:     R ear normal and L ear normal.   Nose:     mucosal erythema, mucosal edema, and airflow obstruction.   Mouth:     Mild oropharyngeal injection, no exudates, petechiae or other lesions.   Neck:     No deformities, masses, or tenderness noted. Lungs:     Normal respiratory effort, chest expands symmetrically. Lungs are clear to auscultation, no crackles or wheezes. Heart:  normal rate, regular rhythm, no murmur, and no gallop.   Abdomen:     Bowel sounds positive,abdomen soft and non-tender without masses, organomegaly or hernias noted. Msk:     No deformity or scoliosis noted of thoracic or lumbar spine.      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 Take 1 tablet by mouth each day  Labs Reviewed: Chol: 123  (12/31/2007)   HDL: 25.6 (12/31/2007)   LDL: 61 (12/31/2007)   TG: 180 (12/31/2007) SGOT: 23 (12/31/2007)   SGPT: 30 (12/31/2007)  Lipid Goals: Chol Goal: 200 (03/17/2007)   HDL Goal: 40 (03/17/2007)   LDL Goal: 130 (03/17/2007)   TG Goal: 150 (03/17/2007)  Prior 10 Yr Risk Heart Disease: 4 % (06/25/2007)   Problem # 4:  ALLERGIC RHINITIS (ICD-477.9) Discussed use of allergy medications and environmental measures.  His updated medication list for this problem includes:    Astepro 0.15 % Soln (Azelastine hcl) .Marland Kitchen..Marland Kitchen Two spray q nare q hs    Nasonex 50 Mcg/act Susp (Mometasone furoate) .Marland Kitchen..Marland Kitchen Two spray q nare q hs   Complete Medication List: 1)  Finasteride 5 Mg Tabs (Finasteride) .... One by mouth daily 2)  Prilosec 20 Mg Cpdr (Omeprazole) .... One by mouth daily 3)  Vytorin 10-20 Mg Tabs (Ezetimibe-simvastatin) .... Take 1 tablet by mouth each day 4)  Testosterone Enanthate 200 Mg/ml Oil (Testosterone enanthate) .... 1/2 cc im every two week 5)  Bd Luer-lok Syringe 20g X 1" 3 Ml Misc (Syringe/needle (disp)) .... 1/2 ml of testosterone every 2 weeks 6)  Astepro 0.15 % Soln (Azelastine hcl) .... Two spray q nare q hs 7)  Nasonex 50 Mcg/act Susp (Mometasone furoate) .... Two spray q nare q hs  Lipid Assessment/Plan:      Based on NCEP/ATP III, the patient's risk factor category is "2 or more risk factors and a calculated 10 year CAD risk of < 20%".  From this information, the patient's calculated lipid goals are as follows: Total cholesterol goal is 200; LDL cholesterol goal is 130; HDL cholesterol goal is 40; Triglyceride goal is 150.  His LDL cholesterol goal has not been met.  Secondary causes for hyperlipidemia have been ruled out.  He has been counseled on adjunctive measures for lowering his cholesterol and has been provided with dietary instructions.     Patient Instructions: 1)  Please schedule a follow-up appointment in 2 months.   Prescriptions: ASTEPRO 0.15 % SOLN (AZELASTINE  HCL) two spray q nare q HS  #1 vial x 11   Entered and Authorized by:   Stacie Glaze MD   Signed by:   Stacie Glaze MD on 06/27/2008   Method used:   Electronically to        [facility] [phone]* (retail)       [address] [place], [place]  [address]       Ph: [phone] or [phone]       Fax: [phone]   RxID:   [id number]

## 2010-05-30 NOTE — Letter (Signed)
Summary: Cancellation Notice/[facility]   Cancellation Notice/[facility]   Imported By: Maryln Gottron 10/05/2008 14:23:22  _____________________________________________________________________  External Attachment:    Type:   Image     Comment:   External Document

## 2010-05-30 NOTE — Assessment & Plan Note (Signed)
Summary: 6 wk rov/njr/PT RESCD//CCM   Vital Signs:  Patient profile:   50 year old male Height:      71 inches Weight:      206 pounds BMI:     28.84 Temp:     98.4 degrees F oral Pulse rate:   76 / minute Resp:     14 per minute BP sitting:   134 / 80  (left arm)  Vitals Entered By: Willy Eddy, LPN (December 06, 2008 10:12 AM)  CC:  rov sleep study.  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 One By Mouth Daily 2)  Prilosec 20 Mg  Cpdr (Omeprazole) .... One By Mouth Daily 3)  Vytorin 10-20 Mg Tabs (Ezetimibe-Simvastatin) .... Take 1 Tablet By Mouth Each Day 4)  Testosterone Enanthate 200 Mg/ml Oil (Testosterone Enanthate) .... 1/2 Cc Im Every Two Week 5)  Bd Luer-Lok Syringe 20g X 1" 3 Ml Misc (Syringe/needle (Disp)) .... 1/2 Ml of Testosterone Every 2 Weeks 6)  Astepro 0.15 % Soln (Azelastine Hcl) .... Two Spray Q Nare Q Hs 7)  Nasonex 50 Mcg/act Susp (Mometasone Furoate) .... Two Spray Q Nare Q Hs 8)  Acyclovir 400 Mg Tabs (Acyclovir) .... One Tablet 4 Times Daily For 3 Days  Current Medications (verified): 1)  Finasteride 5 Mg Tabs (Finasteride) .... One By Mouth Daily 2)  Prilosec 20 Mg  Cpdr (Omeprazole) .... One By Mouth Daily 3)  Vytorin 10-20 Mg Tabs (Ezetimibe-Simvastatin) .... Take 1 Tablet By Mouth Each Day 4)  Testosterone Enanthate 200  Mg/ml Oil (Testosterone Enanthate) .... 1/2 Cc Im Every Two Week 5)  Bd Luer-Lok Syringe 20g X 1" 3 Ml Misc (Syringe/needle (Disp)) .... 1/2 Ml of Testosterone Every 2 Weeks 6)  Astepro 0.15 % Soln (Azelastine Hcl) .... Two Spray Q Nare Q Hs 7)  Nasonex 50 Mcg/act Susp (Mometasone Furoate) .... Two Spray Q Nare Q Hs 8)  Valacyclovir Hcl 500 Mg Tabs (Valacyclovir Hcl) .... One By Mouth Daily  Allergies (verified): 1)  ! Pcn 2)  ! Crestor  Past History:  Family History: Last updated: 06/25/2007 Family History of Alcoholism/Addiction Family History Hypertension  Social History: Last updated: 03/17/2007 Married Never Smoked Alcohol use-no Drug use-no  Risk Factors: Smoking Status: never (03/17/2007)  Past medical, surgical, family and social histories (including risk factors) reviewed, and no changes noted (except as noted below).  Past Medical History: Reviewed history from 06/27/2008 and no changes required. Hyperlipidemia Benign prostatic hypertrophy Allergic rhinitis  Past Surgical History: Reviewed history from 09/23/2006 and no changes required. Vasectomy  Family History: Reviewed history from 06/25/2007 and no changes required. Family History of Alcoholism/Addiction Family History Hypertension  Social History: Reviewed history from 03/17/2007 and no changes required. Married Never Smoked Alcohol use-no Drug use-no  Review of Systems  The patient denies anorexia, fever, weight loss, weight gain, vision loss, decreased hearing, hoarseness,  chest pain, syncope, dyspnea on exertion, peripheral edema, prolonged cough, headaches, hemoptysis, abdominal pain, melena, hematochezia, severe indigestion/heartburn, hematuria, incontinence, genital sores, muscle weakness, suspicious skin lesions, transient blindness, difficulty walking, depression, unusual weight change, abnormal bleeding, enlarged lymph nodes, angioedema, and breast masses.    Physical Exam  General:   Well-developed,well-nourished,in no acute distress; alert,appropriate and cooperative throughout examination Head:  normocephalic and atraumatic.   Eyes:  pupils equal and pupils round.   Ears:  R ear normal and L ear normal.   Nose:  no external deformity and no nasal discharge.   Neck:  supple and full ROM.   Chest Wall:  No deformities, masses, tenderness or gynecomastia noted. Breasts:  no gynecomastia.   Lungs:  normal respiratory effort and no wheezes.   Heart:  normal rate and regular rhythm.   Genitalia:  circumcised.   Msk:  No deformity or scoliosis noted of thoracic or lumbar spine.   Extremities:  No clubbing, cyanosis, edema, or deformity noted with normal full range of motion of all joints.   Neurologic:  No cranial nerve deficits noted. Station and gait are normal. Plantar reflexes are down-going bilaterally. DTRs are symmetrical throughout. Sensory, motor and coordinative functions appear intact.   Impression & Recommendations:  Problem # 1:  GENITAL HERPES (ICD-054.10)  discussion of suppression with daily dose  Orders: Venipuncture ([address]) T- * Misc. Laboratory test [phone])  Problem # 2:  HYPERSOMNIA, ASSOCIATED WITH SLEEP APNEA (ICD-780.53) mask discussion but weight loss is the best therapy  Problem # 3:  ERECTILE DYSFUNCTION (ICD-302.72)  Discussed proper use of medications, as well as side effects.   Complete Medication List: 1)  Finasteride 5 Mg Tabs (Finasteride) .... One by mouth daily 2)  Prilosec 20 Mg Cpdr (Omeprazole) .... One by mouth daily 3)  Vytorin 10-20 Mg Tabs (Ezetimibe-simvastatin) .... Take 1 tablet by mouth each day 4)  Testosterone Enanthate 200 Mg/ml Oil (Testosterone enanthate) .... 1/2 cc im every two week 5)  Bd Luer-lok Syringe 20g X 1" 3 Ml Misc (Syringe/needle (disp)) .... 1/2 ml of testosterone every 2 weeks 6)  Astepro 0.15 % Soln (Azelastine hcl) .... Two spray q nare q hs 7)  Nasonex 50 Mcg/act Susp (Mometasone furoate) ....  Two spray q nare q hs 8)  Valacyclovir Hcl 500 Mg Tabs (Valacyclovir hcl) .... One by mouth daily  Patient Instructions: 1)  weight loss is the goal for the sleep disorder  with goal of 190 2)  as the short term goal 3)  recall the dash diet and use this for the weight loss 4)  Please schedule a follow-up appointment in 3 months. Prescriptions: VALACYCLOVIR HCL 500 MG TABS (VALACYCLOVIR HCL) one by mouth daily  #30 x 11   Entered and Authorized by:   Stacie Glaze MD   Signed by:   Stacie Glaze MD on 12/06/2008   Method used:   [facility] to        [facility] [phone]* (retail)       [address] [place], [place]  [address]       Ph: [phone] or [phone]       Fax: [phone]   RxID:   [id number]

## 2010-05-30 NOTE — Assessment & Plan Note (Signed)
Summary: TESTOSERONE INJ/BONNYE/MHF   Nurse Visit    Prior Medications: FINASTERIDE 5 MG TABS (FINASTERIDE) one by mouth daily PRILOSEC 20 MG  CPDR (OMEPRAZOLE) one by mouth daily VYTORIN 10-20 MG TABS (EZETIMIBE-SIMVASTATIN) Take 1 tablet by mouth each day TESTOSTERONE ENANTHATE 200 MG/ML OIL (TESTOSTERONE ENANTHATE) 1/2 cc IM every two week Current Allergies: ! PCN ! CRESTOR    Medication Administration  Injection # 1:    Medication: tesosterone-1/2 ml given    Diagnosis: TESTOSTERONE DEFICIENCY (ICD-257.2)    Route: IM    Site: LUOQ gluteus    Exp Date: 01/26/2009    Lot #: [id number]    Mfr: paddock    Comments: pt brought own medication 200-69ml/1/2 ml given    Patient tolerated injection without complications    Given by: Willy Eddy, LPN (February 15, 2008 12:28 PM)  Orders Added: 1)  Admin of patients own med IM/SQ [email or web address]    ]

## 2010-05-30 NOTE — Assessment & Plan Note (Signed)
Summary: pains assoc with enlarged prostate/cjr   Vital Signs:  Patient profile:   50 year old male Height:      71 inches Weight:      212 pounds BMI:     29.67 Temp:     98.2 degrees F oral Pulse rate:   72 / minute Resp:     14 per minute BP sitting:   124 / 80  (left arm)  Vitals Entered By: Willy Eddy, LPN (July 06, 2009 12:33 PM) CC: c/o painful episodes with his BPH-   CC:  c/o painful episodes with his BPH-.  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 episodic back and rectal pain after bowel movement recent shooting pain in rectum area with pout radiation to the testicles lasted several hoiurs and then lessened but did not go away no fever chills or night sweats  Preventive Screening-Counseling & Management  Alcohol-Tobacco     Smoking Status: never  Problems Prior to Update: 1)  Acute Maxillary Sinusitis  (ICD-461.0) 2)  Genital Herpes  (ICD-054.10) 3)  Hypersomnia, Associated With Sleep Apnea  (ICD-780.53) 4)  Allergic Rhinitis  (ICD-477.9) 5)  Testosterone Deficiency  (ICD-257.2) 6)  Erectile Dysfunction  (ICD-302.72) 7)  Preventive Health Care  (ICD-V70.0) 8)  Family History of Alcoholism/addiction  (ICD-V61.41) 9)  Uri  (ICD-465.9) 10)  Benign Prostatic Hypertrophy  (ICD-600.00) 11)  Hyperlipidemia  (ICD-272.4)  Current Problems (verified): 1)  Acute Maxillary Sinusitis  (ICD-461.0) 2)  Genital Herpes  (ICD-054.10) 3)  Hypersomnia, Associated With Sleep Apnea  (ICD-780.53) 4)  Allergic Rhinitis  (ICD-477.9) 5)  Testosterone Deficiency  (ICD-257.2) 6)  Erectile Dysfunction  (ICD-302.72) 7)  Preventive Health Care  (ICD-V70.0) 8)  Family History of Alcoholism/addiction  (ICD-V61.41) 9)  Uri  (ICD-465.9) 10)  Benign Prostatic Hypertrophy  (ICD-600.00) 11)  Hyperlipidemia  (ICD-272.4)  Medications Prior to Update: 1)  Finasteride 5 Mg Tabs (Finasteride) .... One By  Mouth Daily 2)  Prilosec 20 Mg  Cpdr (Omeprazole) .... One By Mouth Daily 3)  Vytorin 10-20 Mg Tabs (Ezetimibe-Simvastatin) .... Take 1 Tablet By Mouth Each Day 4)  Testosterone Enanthate 200 Mg/ml Oil (Testosterone Enanthate) .... 1/2 Cc Im Every Two Week 5)  Bd Luer-Lok Syringe 20g X 1" 3 Ml Misc (Syringe/needle (Disp)) .... 1/2 Ml of Testosterone Every 2 Weeks 6)  Valacyclovir Hcl 500 Mg Tabs (Valacyclovir Hcl) .... One By Mouth Daily 7)  Smz-Tmp Ds 800-160 Mg Tabs (Sulfamethoxazole-Trimethoprim) .... One By Mouth Two Times A Day 8)  Phenylephrine-Bromphen-Dm 01-30-19 Mg/37ml Liqd (Phenylephrine-Bromphen-Dm) .... or Bromfed Dm Two Tsp By Mouth Q 6 Hours Prn  Current Medications (verified): 1)  Finasteride 5 Mg Tabs (Finasteride) .... One By Mouth Daily 2)  Prilosec 20 Mg  Cpdr (Omeprazole) .... One By Mouth Daily 3)  Vytorin 10-20 Mg Tabs (Ezetimibe-Simvastatin) .... Take 1 Tablet By Mouth Each Day 4)  Testosterone Enanthate 200 Mg/ml Oil (Testosterone Enanthate) .... 1/2 Cc Im Every Two Week 5)  Bd Luer-Lok Syringe 20g X 1" 3 Ml Misc (Syringe/needle (Disp)) .... 1/2 Ml of Testosterone Every 2 Weeks 6)  Valacyclovir Hcl 500 Mg Tabs (Valacyclovir Hcl) .... One By Mouth Daily 7)  Ciprofloxacin Hcl 500 Mg Tabs (Ciprofloxacin Hcl) .... One By Mouth Bid For 21 Days  Allergies (verified): 1)  ! Pcn 2)  ! Crestor  Past History:  Family History: Last updated: 06/25/2007 Family History of Alcoholism/Addiction Family History Hypertension  Social History: Last updated: 03/17/2007 Married Never Smoked Alcohol use-no Drug use-no  Risk Factors: Smoking Status: never (07/06/2009)  Past medical, surgical, family and social histories (including risk factors) reviewed, and no changes noted (except as noted below).  Past Medical History: Reviewed history from 06/27/2008 and no changes required. Hyperlipidemia Benign prostatic hypertrophy Allergic rhinitis  Past Surgical  History: Reviewed history from 09/23/2006 and no changes required. Vasectomy  Family History: Reviewed history from 06/25/2007 and no changes required. Family History of Alcoholism/Addiction Family History Hypertension  Social History: Reviewed history from 03/17/2007 and no changes required. Married Never Smoked Alcohol use-no Drug use-no  Review of Systems  The patient denies anorexia, fever, weight loss, weight gain, vision loss, decreased hearing, hoarseness, chest pain, syncope, dyspnea on exertion, peripheral edema, prolonged cough, headaches, hemoptysis, abdominal pain, melena, hematochezia, severe indigestion/heartburn, hematuria, incontinence, genital sores, muscle weakness, suspicious skin lesions, transient blindness, difficulty walking, depression, unusual weight change, abnormal bleeding, enlarged lymph nodes, angioedema, and breast masses.    Physical Exam  General:  Well-developed,well-nourished,in no acute distress; alert,appropriate and cooperative throughout examination Head:  normocephalic and atraumatic.   Eyes:  pupils equal and pupils round.   Ears:  R ear normal and L ear normal.   Neck:  supple and full ROM.   Lungs:  normal respiratory effort and no wheezes.  increased uper bronchial sounds Heart:  normal rate and no murmur.   Abdomen:  Bowel sounds positive,abdomen soft and non-tender without masses, organomegaly or hernias noted. Prostate:  1+ enlarged and R asymmetrical enlargement.     Impression & Recommendations:  Problem # 1:  PROSTATITIS, ACUTE (ICD-601.0) acute inflamation and swelling pf right lobe  Problem # 2:  BENIGN PROSTATIC HYPERTROPHY (ICD-600.00) reduction in mass noted His updated medication list for this problem includes:    Finasteride 5 Mg Tabs (Finasteride) ..... One by mouth daily  Complete Medication List: 1)  Finasteride 5 Mg Tabs (Finasteride) .... One by mouth daily 2)  Prilosec 20 Mg Cpdr (Omeprazole) .... One by mouth  daily 3)  Vytorin 10-20 Mg Tabs (Ezetimibe-simvastatin) .... Take 1 tablet by mouth each day 4)  Testosterone Enanthate 200 Mg/ml Oil (Testosterone enanthate) .... 1/2 cc im every two week 5)  Bd Luer-lok Syringe 20g X 1" 3 Ml Misc (Syringe/needle (disp)) .... 1/2 ml of testosterone every 2 weeks 6)  Valacyclovir Hcl 500 Mg Tabs (Valacyclovir hcl) .... One by mouth daily 7)  Ciprofloxacin Hcl 500 Mg Tabs (Ciprofloxacin hcl) .... One by mouth bid for 21 days  Patient Instructions: 1)  Take your antibiotic as prescribed until ALL of it is gone, but stop if you develop a rash or swelling and contact our office as soon as possible. Prescriptions: CIPROFLOXACIN HCL 500 MG TABS (CIPROFLOXACIN HCL) one by mouth BID for 21 days  #42 x 0   Entered and Authorized by:   Stacie Glaze MD   Signed by:   Stacie Glaze MD on 07/06/2009   Method used:   [facility] to        [facility] [phone]* (retail)       [address] [place], [place]  [address]       Ph: [phone] or [phone]       Fax: [phone]   RxID:   [id number]

## 2010-05-30 NOTE — Progress Notes (Signed)
Summary: echo  Phone Note Call from Patient Call back at Work Phone [phone]   Caller: vm Reason for Call: Lab or Test Results Summary of Call: ECHO done Mon at [facility].   Initial call taken by: Rudy Jew, RN,  February 25, 2010 3:49 PM  Follow-up for Phone Call        essentially normal with mild "stiffness"  we often add a low dose medication  but needs OV ( no rush) to discuss side efects amlodipine 2.5 by mouth daily Follow-up by: Stacie Glaze MD,  February 25, 2010 4:55 PM  Additional Follow-up for Phone Call Additional follow up Details #1::        Pt will call for appt with Dr. Lovell Sheehan to discuss Echo results. Additional Follow-up by: Lynann Beaver CMA AAMA,  February 25, 2010 4:59 PM    Additional Follow-up for Phone Call Additional follow up Details #2::    called pt and gave him an appointment Follow-up by: Willy Eddy, LPN,  February 26, 2010 3:31 PM

## 2010-05-30 NOTE — Medication Information (Signed)
Summary: Androderm Approved  Androderm Approved   Imported By: Maryln Gottron 11/02/2009 14:58:01  _____________________________________________________________________  External Attachment:    Type:   Image     Comment:   External Document

## 2010-05-30 NOTE — Assessment & Plan Note (Signed)
Summary: discuss echo and medication/bmw   Vital Signs:  Patient profile:   50 year old male Height:      71 inches Weight:      206 pounds BMI:     28.84 Temp:     98.2 degrees F oral Pulse rate:   68 / minute Resp:     14 per minute BP sitting:   130 / 80  (left arm)  Vitals Entered By: Willy Eddy, LPN (March 13, 2010 1:24 PM) CC: discuss echo Is Patient Diabetic? No   Primary Care Provider:  Stacie Glaze MD  CC:  discuss echo.  History of Present Illness: Echo with mild LVH, sleep study 2010 with mild apnea and stress test with Hypertesive response all contribte to probable more advanced apnea syndrom Normal resting blood pressures but with hypertensive response on stress this suggests more significant dz I have spent greater that 45 min face to face evaluating this patient and over 1/2 of this time was in councilling Wife present case discussed with cardiology  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) 2)  Sleep Apnea, Obstructive, Moderate  (ICD-327.23) 3)  Dyspnea On Exertion  (ICD-786.09) 4)  Candidiasis, Urogenital  (ICD-112.2) 5)  Prostatitis, Acute  (ICD-601.0) 6)  Acute Maxillary Sinusitis  (ICD-461.0) 7)  Genital Herpes  (ICD-054.10) 8)  Hypersomnia, Associated With Sleep Apnea  (ICD-780.53) 9)  Allergic Rhinitis  (ICD-477.9) 10)  Testosterone Deficiency  (ICD-257.2) 11)  Erectile Dysfunction  (ICD-302.72) 12)  Preventive Health Care  (ICD-V70.0) 13)  Family History of Alcoholism/addiction  (ICD-V61.41) 14)  Uri  (ICD-465.9) 15)  Benign Prostatic Hypertrophy  (ICD-600.00) 16)  Hyperlipidemia  (ICD-272.4)  Current Problems (verified): 1)  Dyspnea On Exertion  (ICD-786.09) 2)  Candidiasis, Urogenital  (ICD-112.2) 3)  Prostatitis, Acute  (ICD-601.0) 4)  Acute  Maxillary Sinusitis  (ICD-461.0) 5)  Genital Herpes  (ICD-054.10) 6)  Hypersomnia, Associated With Sleep Apnea  (ICD-780.53) 7)  Allergic Rhinitis  (ICD-477.9) 8)  Testosterone Deficiency  (ICD-257.2) 9)  Erectile Dysfunction  (ICD-302.72) 10)  Preventive Health Care  (ICD-V70.0) 11)  Family History of Alcoholism/addiction  (ICD-V61.41) 12)  Uri  (ICD-465.9) 13)  Benign Prostatic Hypertrophy  (ICD-600.00) 14)  Hyperlipidemia  (ICD-272.4)  Medications Prior to Update: 1)  Avodart 0.5 Mg Caps (Dutasteride) .... One By Mouth Daily 2)  Prilosec 20 Mg  Cpdr (Omeprazole) .... One By Mouth Daily 3)  Vytorin 10-20 Mg Tabs (Ezetimibe-Simvastatin) .... Take 1 Tablet By Mouth Each Day 4)  Testosterone Enanthate 200 Mg/ml Oil (Testosterone Enanthate) .... 1/2 Cc Im Every Two Week 5)  Bd Luer-Lok Syringe 20g X 1" 3 Ml Misc (Syringe/needle (Disp)) .... 1/2 Ml of Testosterone Every 2 Weeks 6)  Valacyclovir Hcl 500 Mg Tabs (Valacyclovir Hcl) .... One By Mouth Daily  Current Medications (verified): 1)  Avodart 0.5 Mg Caps (Dutasteride) .... One By Mouth Daily 2)  Prilosec 20 Mg  Cpdr (Omeprazole) .... One By Mouth Daily 3)  Vytorin 10-20 Mg Tabs (Ezetimibe-Simvastatin) .... Take 1 Tablet By Mouth Each Day 4)  Testosterone Enanthate 200 Mg/ml Oil (Testosterone Enanthate) .... 1/2 Cc Im Every Two Week 5)  Bd Luer-Lok Syringe 20g X 1" 3 Ml Misc (Syringe/needle (Disp)) .... 1/2 Ml of Testosterone Every 2 Weeks 6)  Valacyclovir Hcl 500 Mg Tabs (Valacyclovir Hcl) .Marland KitchenMarland KitchenMarland Kitchen  One By Mouth Daily 7)  Benazepril Hcl 10 Mg Tabs (Benazepril Hcl) .... One By Mouth Daily  Allergies (verified): 1)  ! Pcn 2)  ! Crestor  Past History:  Family History: Last updated: 06/25/2007 Family History of Alcoholism/Addiction Family History Hypertension  Social History: Last updated: 03/17/2007 Married Never Smoked Alcohol use-no Drug use-no  Risk Factors: Smoking Status: never (03/13/2010)  Past medical, surgical,  family and social histories (including risk factors) reviewed, and no changes noted (except as noted below).  Past Medical History: Reviewed history from 06/27/2008 and no changes required. Hyperlipidemia Benign prostatic hypertrophy Allergic rhinitis  Past Surgical History: Reviewed history from 09/23/2006 and no changes required. Vasectomy  Family History: Reviewed history from 06/25/2007 and no changes required. Family History of Alcoholism/Addiction Family History Hypertension  Social History: Reviewed history from 03/17/2007 and no changes required. Married Never Smoked Alcohol use-no Drug use-no  Review of Systems  The patient denies anorexia, fever, weight loss, weight gain, vision loss, decreased hearing, hoarseness, chest pain, syncope, dyspnea on exertion, peripheral edema, prolonged cough, headaches, hemoptysis, abdominal pain, melena, hematochezia, severe indigestion/heartburn, hematuria, incontinence, genital sores, muscle weakness, suspicious skin lesions, transient blindness, difficulty walking, depression, unusual weight change, abnormal bleeding, enlarged lymph nodes, angioedema, breast masses, and testicular masses.    Physical Exam  General:  alert and underweight appearing.   Head:  normocephalic and atraumatic.   Eyes:  pupils equal and pupils round.   Ears:  R ear normal and L ear normal.   Nose:  mucosal erythema, mucosal edema, and airflow obstruction.   Neck:  supple and full ROM.   Lungs:  normal respiratory effort and no wheezes.  increased uper bronchial sounds Heart:  normal rate and no murmur.   Abdomen:  soft, non-tender, and normal bowel sounds.     Impression & Recommendations:  Problem # 1:  DYSPNEA ON EXERTION (ICD-786.09)  His updated medication list for this problem includes:    Benazepril Hcl 10 Mg Tabs (Benazepril hcl) ..... One by mouth daily  Problem # 2:  VENTRICULAR HYPERTROPHY, LEFT (ICD-429.3) echo results reviewed and plan  discussd with cardiology fro [facility] to reduce pressure and remodel heart follow up echo  Problem # 3:  SLEEP APNEA, OBSTRUCTIVE, MODERATE (ICD-327.23) cpap  could the sleep apnea be a contributing stressor for the lvh. Orders: Durable Medical Equipment (DME) Misc. Referral (Misc. Ref)  Problem # 4:  VENTRICULAR HYPERTROPHY, LEFT (ICD-429.3)  Complete Medication List: 1)  Avodart 0.5 Mg Caps (Dutasteride) .... One by mouth daily 2)  Prilosec 20 Mg Cpdr (Omeprazole) .... One by mouth daily 3)  Vytorin 10-20 Mg Tabs (Ezetimibe-simvastatin) .... Take 1 tablet by mouth each day 4)  Testosterone Enanthate 200 Mg/ml Oil (Testosterone enanthate) .... 1/2 cc im every two week 5)  Bd Luer-lok Syringe 20g X 1" 3 Ml Misc (Syringe/needle (disp)) .... 1/2 ml of testosterone every 2 weeks 6)  Valacyclovir Hcl 500 Mg Tabs (Valacyclovir hcl) .... One by mouth daily 7)  Benazepril Hcl 10 Mg Tabs (Benazepril hcl) .... One by mouth daily  Patient Instructions: 1)  keep appointment dec 17 Prescriptions: TESTOSTERONE ENANTHATE 200 MG/ML OIL (TESTOSTERONE ENANTHATE) 1/2 cc IM every two week  #10cc x 3   Entered by:   Willy Eddy, LPN   Authorized by:   Stacie Glaze MD   Signed by:   Willy Eddy, LPN on 03/47/4259   Method used:   Telephoned [facility]  [place] [phone][facility] (retail)       [address] [place], [place]  [address]       Ph: [phone] or [phone]       Fax: [phone]   RxID:   [id number] BENAZEPRIL HCL 10 MG TABS (BENAZEPRIL HCL) one by mouth daily  #30 x 11   Entered and Authorized by:   Stacie Glaze MD   Signed by:   Stacie Glaze MD on 03/13/2010   Method used:   [facility]ronically to        [facility] [phone]* (retail)       [address] [place], [place]  [address]       Ph: [phone] or [phone]       Fax: [phone]   RxID:   [id number]    Orders Added: 1)  Durable Medical Equipment [DME] 2)  Est. Patient Level IV  [62694] 3)  Misc. Referral [Misc. Ref]

## 2010-05-30 NOTE — Assessment & Plan Note (Signed)
Summary: fup on med/ pt rsc from bmp/cjr   Vital Signs:  Patient profile:   50 year old male Height:      71 inches Weight:      206 pounds BMI:     28.84 Temp:     98.2 degrees F oral Pulse rate:   68 / minute Resp:     14 per minute BP sitting:   116 / 80  (left arm)  Vitals Entered By: Willy Eddy, LPN (April 10, 2010 10:55 AM) CC: roa after starting benazepril-  pt states no side effects, Hypertension Management Is Patient Diabetic? No   Primary Care Provider:  Stacie Glaze MD  CC:  roa after starting benazepril-  pt states no side effects and Hypertension Management.  History of Present Illness: has been adjusting the CPap and has been getting used to the machine has been adjusting the temperature settings his wife reports no snoring.... the fatigue issues have improved tolerating the blood pressure medicatons discussion of the LVH and monitering the blood pressure has been in the 120 range wth exertions this seems to be controlling the exertional blood pressure rise seen in the stress test  Hypertension History:      He denies headache, chest pain, palpitations, dyspnea with exertion, orthopnea, PND, peripheral edema, visual symptoms, neurologic problems, syncope, and side effects from treatment.        Positive major cardiovascular risk factors include male age 79 years old or older, hyperlipidemia, and hypertension.  Negative major cardiovascular risk factors include no history of diabetes and non-tobacco-user status.        Further assessment for target organ damage reveals no history of ASHD, stroke/TIA, or peripheral vascular disease.     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) 2)  Sleep Apnea, Obstructive, Moderate  (ICD-327.23) 3)  Dyspnea On Exertion  (ICD-786.09) 4)   Candidiasis, Urogenital  (ICD-112.2) 5)  Prostatitis, Acute  (ICD-601.0) 6)  Acute Maxillary Sinusitis  (ICD-461.0) 7)  Genital Herpes  (ICD-054.10) 8)  Hypersomnia, Associated With Sleep Apnea  (ICD-780.53) 9)  Allergic Rhinitis  (ICD-477.9) 10)  Testosterone Deficiency  (ICD-257.2) 11)  Erectile Dysfunction  (ICD-302.72) 12)  Preventive Health Care  (ICD-V70.0) 13)  Family History of Alcoholism/addiction  (ICD-V61.41) 14)  Uri  (ICD-465.9) 15)  Benign Prostatic Hypertrophy  (ICD-600.00) 16)  Hyperlipidemia  (ICD-272.4)  Current Problems (verified): 1)  Ventricular Hypertrophy, 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) 2)  Sleep Apnea, Obstructive, Moderate  (ICD-327.23) 3)  Dyspnea On Exertion  (ICD-786.09) 4)  Candidiasis, Urogenital  (ICD-112.2) 5)  Prostatitis, Acute  (ICD-601.0) 6)  Acute Maxillary Sinusitis  (ICD-461.0) 7)  Genital Herpes  (ICD-054.10) 8)  Hypersomnia, Associated With Sleep Apnea  (ICD-780.53) 9)  Allergic Rhinitis  (ICD-477.9) 10)  Testosterone Deficiency  (ICD-257.2) 11)  Erectile Dysfunction  (ICD-302.72) 12)  Preventive Health Care  (ICD-V70.0) 13)  Family History of Alcoholism/addiction  (ICD-V61.41) 14)  Uri  (ICD-465.9) 15)  Benign Prostatic Hypertrophy  (ICD-600.00) 16)  Hyperlipidemia  (ICD-272.4)  Medications Prior to Update: 1)  Avodart 0.5 Mg Caps (Dutasteride) .... One By Mouth Daily 2)  Prilosec 20 Mg  Cpdr (Omeprazole) .... One By Mouth Daily 3)  Vytorin 10-20 Mg Tabs (Ezetimibe-Simvastatin) .... Take 1 Tablet By Mouth Each Day 4)  Testosterone Enanthate 200 Mg/ml Oil (Testosterone Enanthate) .Marland KitchenMarland KitchenMarland Kitchen  1/2 Cc Im Every Two Week 5)  Bd Luer-Lok Syringe 20g X 1" 3 Ml Misc (Syringe/needle (Disp)) .... 1/2 Ml of Testosterone Every 2 Weeks 6)  Valacyclovir Hcl 500 Mg Tabs (Valacyclovir Hcl) .... One By Mouth Daily 7)  Benazepril Hcl 10 Mg Tabs (Benazepril Hcl) .... One By Mouth Daily  Current Medications (verified): 1)  Avodart 0.5 Mg Caps (Dutasteride) .... One By Mouth  Daily 2)  Prilosec 20 Mg  Cpdr (Omeprazole) .... One By Mouth Daily 3)  Vytorin 10-20 Mg Tabs (Ezetimibe-Simvastatin) .... Take 1 Tablet By Mouth Each Day 4)  Testosterone Enanthate 200 Mg/ml Oil (Testosterone Enanthate) .... 1/2 Cc Im Every Two Week 5)  Bd Luer-Lok Syringe 20g X 1" 3 Ml Misc (Syringe/needle (Disp)) .... 1/2 Ml of Testosterone Every 2 Weeks 6)  Valacyclovir Hcl 500 Mg Tabs (Valacyclovir Hcl) .... One By Mouth Daily 7)  Benazepril Hcl 10 Mg Tabs (Benazepril Hcl) .... One By Mouth Daily  Allergies (verified): 1)  ! Pcn 2)  ! Crestor  Past History:  Family History: Last updated: 06/25/2007 Family History of Alcoholism/Addiction Family History Hypertension  Social History: Last updated: 03/17/2007 Married Never Smoked Alcohol use-no Drug use-no  Risk Factors: Smoking Status: never (04/10/2010)  Past medical, surgical, family and social histories (including risk factors) reviewed, and no changes noted (except as noted below).  Past Medical History: Reviewed history from 06/27/2008 and no changes required. Hyperlipidemia Benign prostatic hypertrophy Allergic rhinitis  Past Surgical History: Reviewed history from 09/23/2006 and no changes required. Vasectomy  Family History: Reviewed history from 06/25/2007 and no changes required. Family History of Alcoholism/Addiction Family History Hypertension  Social History: Reviewed history from 03/17/2007 and no changes required. Married Never Smoked Alcohol use-no Drug use-no  Review of Systems  The patient denies anorexia, fever, weight loss, weight gain, vision loss, decreased hearing, hoarseness, chest pain, syncope, dyspnea on exertion, peripheral edema, prolonged cough, headaches, hemoptysis, abdominal pain, melena, hematochezia, severe indigestion/heartburn, hematuria, incontinence, genital sores, muscle weakness, suspicious skin lesions, transient blindness, difficulty walking, depression, unusual  weight change, abnormal bleeding, enlarged lymph nodes, angioedema, breast masses, and testicular masses.    Physical Exam  General:  alert and underweight appearing.   Head:  normocephalic and atraumatic.   Eyes:  pupils equal and pupils round.   Ears:  R ear normal and L ear normal.   Nose:  mucosal erythema, mucosal edema, and airflow obstruction.   Mouth:  posterior lymphoid hypertrophy and postnasal drip.   Neck:  supple and full ROM.   Lungs:  normal respiratory effort and no wheezes.  increased uper bronchial sounds Heart:  normal rate and no murmur.   Abdomen:  soft, non-tender, and normal bowel sounds.     Impression & Recommendations:  Problem # 1:  VENTRICULAR HYPERTROPHY, LEFT (ICD-429.3) Assessment Improved echo in septemper for LVH monitering  ( early septemper) the ace is working well  Problem # 2:  SLEEP APNEA, OBSTRUCTIVE, MODERATE (ICD-327.23) adjusting to the cpap well  Problem # 3:  HYPERLIPIDEMIA (ICD-272.4) Assessment: Unchanged  His updated medication list for this problem includes:    Vytorin 10-20 Mg Tabs (Ezetimibe-simvastatin) .Marland Kitchen... Take 1 tablet by mouth each day  Labs Reviewed: SGOT: 27 (01/03/2010)   SGPT: 39 (01/03/2010)  Lipid Goals: Chol Goal: 200 (03/17/2007)   HDL Goal: 40 (03/17/2007)   LDL Goal: 130 (03/17/2007)   TG Goal: 150 (03/17/2007)  10 Yr Risk Heart Disease: 9 % Prior 10 Yr Risk Heart Disease: 14 % (02/05/2010)  HDL:32.00 (01/03/2010), 27.3 (06/19/2008)  LDL:101 (01/03/2010), 93 (62/95/2841)  Chol:170 (01/03/2010), 153 (06/19/2008)  Trig:185.0 (01/03/2010), 163 (06/19/2008)  Problem # 4:  ESSENTIAL HYPERTENSION (ICD-401.9) Assessment: Improved  His updated medication list for this problem includes:    Benazepril Hcl 10 Mg Tabs (Benazepril hcl) ..... One by mouth daily  BP today: 116/80 Prior BP: 130/80 (03/13/2010)  10 Yr Risk Heart Disease: 11 % Prior 10 Yr Risk Heart Disease: 14 % (02/05/2010)  Labs Reviewed: K+:  4.7 (01/03/2010) Creat: : 1.1 (01/03/2010)   Chol: 170 (01/03/2010)   HDL: 32.00 (01/03/2010)   LDL: 101 (01/03/2010)   TG: 185.0 (01/03/2010)  Complete Medication List: 1)  Avodart 0.5 Mg Caps (Dutasteride) .... One by mouth daily 2)  Prilosec 20 Mg Cpdr (Omeprazole) .... One by mouth daily 3)  Vytorin 10-20 Mg Tabs (Ezetimibe-simvastatin) .... Take 1 tablet by mouth each day 4)  Testosterone Enanthate 200 Mg/ml Oil (Testosterone enanthate) .... 1/2 cc im every two week 5)  Bd Luer-lok Syringe 20g X 1" 3 Ml Misc (Syringe/needle (disp)) .... 1/2 ml of testosterone every 2 weeks 6)  Valacyclovir Hcl 500 Mg Tabs (Valacyclovir hcl) .... One by mouth daily 7)  Benazepril Hcl 10 Mg Tabs (Benazepril hcl) .... One by mouth daily  Hypertension Assessment/Plan:      The patient's hypertensive risk group is category B: At least one risk factor (excluding diabetes) with no target organ damage.  His calculated 10 year risk of coronary heart disease is 11 %.  Today's blood pressure is 116/80.  His blood pressure goal is < 140/90.  Patient Instructions: 1)  Early september CPX and echo prior   Orders Added: 1)  Est. Patient Level IV [32440]

## 2010-09-10 NOTE — Procedures (Signed)
Joseph Drake, Joseph Drake             ACCOUNT NO.:  [id number]   MEDICAL RECORD NO.:  [id number]          PATIENT TYPE:  OUT   LOCATION:  SLEEP CENTER                 FACILITY[facility]  [facility]   PHYSICIAN:  Barbaraann Share, MD,FCCPDATE OF BIRTH:  Sep 06, 1959   DATE OF STUDY:  11/08/2008                            NOCTURNAL POLYSOMNOGRAM   REFERRING PHYSICIAN:  Stacie Glaze, MD   INDICATION FOR STUDY:  Hypersomnia with sleep apnea.   EPWORTH SLEEPINESS SCORE:  Five.   MEDICATIONS:   SLEEP ARCHITECTURE:  The patient had total sleep time of 255 minutes  with very little slow wave sleep and only 27 minutes of REM.  Sleep  onset latency was normal at 16 minutes, and REM onset was normal at 94  minutes.  Sleep efficiency was poor at 65%.   RESPIRATORY DATA:  The patient was found to have 7 apneas and 13  obstructive hypopneas, giving him an apnea-hypopnea index of only 5  events per hour.  He was also found to have 139 respiratory effort  related arousals, giving him a respiratory disturbance index of 37  events per hour.  Events were not positional and there was moderate-to-  loud snoring noted throughout.  The patient did not meet split-night  protocol secondary to the small numbers of obstructive events.   OXYGEN DATA:  There was very transient oxygen desaturation as low as 80%  with the patient's obstructive events.   CARDIAC DATA:  No clinically significant arrhythmias were seen.   MOVEMENT-PARASOMNIA:  The patient had no significant leg jerks or  abnormal behaviors noted.   IMPRESSIONS-RECOMMENDATIONS:  Small numbers of obstructive events which  do not meet the apnea-hypopnea index criteria for the obstructive sleep  apnea syndrome.  However, the patient did have very large numbers of  respiratory effort related arousals that did appear to disrupt sleep  giving him a respiratory disturbance index of 37 events per hour.  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  If it appears to be  affecting the patient's quality of life to a significant degree,  treatments can include a trial of weight loss alone, upper airway  surgery, dental appliance, and also continuous positive airway pressure.      Barbaraann Share, MD,FCCP  Diplomate, American Board of Sleep  Medicine  Electronically Signed     [facility]/MED[facility]  D:  11/19/2008 17:00:00  T:  11/20/2008 75:64:33  Job:  [id number]

## 2010-10-07 ENCOUNTER — Other Ambulatory Visit: Payer: Self-pay | Admitting: Internal Medicine

## 2010-12-02 ENCOUNTER — Ambulatory Visit (INDEPENDENT_AMBULATORY_CARE_PROVIDER_SITE_OTHER): Payer: BC Managed Care – PPO | Admitting: Internal Medicine

## 2010-12-02 ENCOUNTER — Encounter: Payer: Self-pay | Admitting: Internal Medicine

## 2010-12-02 VITALS — Temp 98.0°F | Ht 69.0 in | Wt 181.0 lb

## 2010-12-02 DIAGNOSIS — L237 Allergic contact dermatitis due to plants, except food: Secondary | ICD-10-CM

## 2010-12-02 DIAGNOSIS — L255 Unspecified contact dermatitis due to plants, except food: Secondary | ICD-10-CM

## 2010-12-02 MED ORDER — PREDNISONE 20 MG PO TABS: 20.0000 mg | ORAL_TABLET | Freq: Every day | ORAL | Status: AC

## 2010-12-09 ENCOUNTER — Other Ambulatory Visit: Payer: Self-pay | Admitting: Internal Medicine

## 2010-12-12 NOTE — Progress Notes (Signed)
  Subjective:    Patient ID: Joseph Drake, male    DOB: Apr 17, 1961, 50 y.o.   MRN: [id number]  HPI Patient presents for an acute visit after exposure to poison ivy and a rash outbreak on his face the rash is pruritic.   Review of Systems  Constitutional: Negative for fever and fatigue.  HENT: Negative for hearing loss, congestion, neck pain and postnasal drip.   Eyes: Negative for discharge, redness and visual disturbance.  Respiratory: Negative for cough, shortness of breath and wheezing.   Cardiovascular: Negative for leg swelling.  Gastrointestinal: Negative for abdominal pain, constipation and abdominal distention.  Genitourinary: Negative for urgency and frequency.  Musculoskeletal: Negative for joint swelling and arthralgias.  Skin: Positive for rash. Negative for color change.  Neurological: Negative for weakness and light-headedness.  Hematological: Negative for adenopathy.  Psychiatric/Behavioral: Negative for behavioral problems.       Objective:   Physical Exam  Constitutional: He appears well-developed and well-nourished.  HENT:  Head: Normocephalic and atraumatic.  Eyes: Conjunctivae are normal. Pupils are equal, round, and reactive to light.  Neck: Normal range of motion. Neck supple.  Cardiovascular: Normal rate and regular rhythm.   Pulmonary/Chest: Effort normal and breath sounds normal.  Abdominal: Soft. Bowel sounds are normal.  Skin: Rash noted.          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

## 2011-01-13 ENCOUNTER — Other Ambulatory Visit (INDEPENDENT_AMBULATORY_CARE_PROVIDER_SITE_OTHER): Payer: BC Managed Care – PPO

## 2011-01-13 DIAGNOSIS — Z0000 Encounter for general adult medical examination without abnormal findings: Secondary | ICD-10-CM

## 2011-01-13 LAB — POCT URINALYSIS DIPSTICK
Bilirubin, UA: NEGATIVE
Blood, UA: NEGATIVE
Glucose, UA: NEGATIVE
Ketones, UA: NEGATIVE
Leukocytes, UA: NEGATIVE
Nitrite, UA: NEGATIVE
Protein, UA: NEGATIVE
Spec Grav, UA: 1.025
Urobilinogen, UA: 0.2
pH, UA: 7

## 2011-01-13 LAB — CBC WITH DIFFERENTIAL/PLATELET
Basophils Absolute: 0 10*3/uL (ref 0.0–0.1)
Basophils Relative: 0.3 % (ref 0.0–3.0)
Eosinophils Absolute: 0.1 10*3/uL (ref 0.0–0.7)
Eosinophils Relative: 0.8 % (ref 0.0–5.0)
HCT: 44.5 % (ref 39.0–52.0)
Hemoglobin: 14.9 g/dL (ref 13.0–17.0)
Lymphocytes Relative: 18.9 % (ref 12.0–46.0)
Lymphs Abs: 1.2 10*3/uL (ref 0.7–4.0)
MCHC: 33.5 g/dL (ref 30.0–36.0)
MCV: 93 fl (ref 78.0–100.0)
Monocytes Absolute: 0.3 10*3/uL (ref 0.1–1.0)
Monocytes Relative: 4.6 % (ref 3.0–12.0)
Neutro Abs: 4.7 10*3/uL (ref 1.4–7.7)
Neutrophils Relative %: 75.4 % (ref 43.0–77.0)
Platelets: 200 10*3/uL (ref 150.0–400.0)
RBC: 4.79 Mil/uL (ref 4.22–5.81)
RDW: 13.9 % (ref 11.5–14.6)
WBC: 6.3 10*3/uL (ref 4.5–10.5)

## 2011-01-13 LAB — TSH: TSH: 1.58 u[IU]/mL (ref 0.35–5.50)

## 2011-01-13 LAB — LIPID PANEL
Cholesterol: 133 mg/dL (ref 0–200)
HDL: 41.1 mg/dL (ref >39.00)
LDL Cholesterol: 76 mg/dL (ref 0–99)
Total CHOL/HDL Ratio: 3
Triglycerides: 79 mg/dL (ref 0.0–149.0)
VLDL: 15.8 mg/dL (ref 0.0–40.0)

## 2011-01-13 LAB — BASIC METABOLIC PANEL
BUN: 18 mg/dL (ref 6–23)
CO2: 28 mEq/L (ref 19–32)
Calcium: 9.3 mg/dL (ref 8.4–10.5)
Chloride: 106 mEq/L (ref 96–112)
Creatinine, Ser: 1.2 mg/dL (ref 0.4–1.5)
GFR: 69.09 mL/min (ref >60.00)
Glucose, Bld: 88 mg/dL (ref 70–99)
Potassium: 5.5 mEq/L — ABNORMAL HIGH (ref 3.5–5.1)
Sodium: 141 mEq/L (ref 135–145)

## 2011-01-13 LAB — HEPATIC FUNCTION PANEL
ALT: 37 U/L (ref 0–53)
AST: 44 U/L — ABNORMAL HIGH (ref 0–37)
Albumin: 4.3 g/dL (ref 3.5–5.2)
Alkaline Phosphatase: 74 U/L (ref 39–117)
Bilirubin, Direct: 0.1 mg/dL (ref 0.0–0.3)
Total Bilirubin: 0.8 mg/dL (ref 0.3–1.2)
Total Protein: 6.9 g/dL (ref 6.0–8.3)

## 2011-01-13 LAB — PSA: PSA: 0.19 ng/mL (ref 0.10–4.00)

## 2011-01-20 ENCOUNTER — Ambulatory Visit (INDEPENDENT_AMBULATORY_CARE_PROVIDER_SITE_OTHER): Payer: BC Managed Care – PPO | Admitting: Internal Medicine

## 2011-01-20 ENCOUNTER — Encounter: Payer: Self-pay | Admitting: Internal Medicine

## 2011-01-20 VITALS — BP 120/76 | HR 68 | Temp 98.1°F | Resp 14 | Ht 71.0 in | Wt 176.0 lb

## 2011-01-20 DIAGNOSIS — T887XXA Unspecified adverse effect of drug or medicament, initial encounter: Secondary | ICD-10-CM

## 2011-01-20 DIAGNOSIS — Z23 Encounter for immunization: Secondary | ICD-10-CM

## 2011-01-20 DIAGNOSIS — E875 Hyperkalemia: Secondary | ICD-10-CM

## 2011-01-20 DIAGNOSIS — Z0000 Encounter for general adult medical examination without abnormal findings: Secondary | ICD-10-CM

## 2011-01-20 DIAGNOSIS — I10 Essential (primary) hypertension: Secondary | ICD-10-CM

## 2011-01-20 DIAGNOSIS — I951 Orthostatic hypotension: Secondary | ICD-10-CM

## 2011-01-20 LAB — HEPATIC FUNCTION PANEL
ALT: 39 U/L (ref 0–53)
AST: 29 U/L (ref 0–37)
Albumin: 4.3 g/dL (ref 3.5–5.2)
Alkaline Phosphatase: 78 U/L (ref 39–117)
Bilirubin, Direct: 0 mg/dL (ref 0.0–0.3)
Total Bilirubin: 0.6 mg/dL (ref 0.3–1.2)
Total Protein: 7.5 g/dL (ref 6.0–8.3)

## 2011-01-20 LAB — BASIC METABOLIC PANEL
BUN: 16 mg/dL (ref 6–23)
CO2: 30 mEq/L (ref 19–32)
Calcium: 9.1 mg/dL (ref 8.4–10.5)
Chloride: 104 mEq/L (ref 96–112)
Creatinine, Ser: 1.2 mg/dL (ref 0.4–1.5)
GFR: 71.17 mL/min (ref >60.00)
Glucose, Bld: 86 mg/dL (ref 70–99)
Potassium: 5.1 mEq/L (ref 3.5–5.1)
Sodium: 141 mEq/L (ref 135–145)

## 2011-01-20 MED ORDER — BENAZEPRIL HCL 10 MG PO TABS: 5.0000 mg | ORAL_TABLET | Freq: Every day | ORAL | Status: DC

## 2011-01-20 NOTE — Progress Notes (Signed)
Subjective:    Patient ID: Joseph Drake, male    DOB: Sep 26, 1960, 50 y.o.   MRN: [id number]  HPI  The patient presents for a complete physical examination.  He has a chief complaint today of some positional hypotension since he has lost weight he has been on blood pressure medicine for LVH seen on echocardiogram last year.  States she's been doing well he's been exercising all of his laboratory work is essentially normal with the exception of elevated potassium that was present in a fasting state with no water on benazepril.  He also has an elevated AST.  We will redraw these today in a nonfasting state and consider decreasing his benazepril.    Review of Systems  Constitutional: Negative for fever and fatigue.  HENT: Negative for hearing loss, congestion, neck pain and postnasal drip.   Eyes: Negative for discharge, redness and visual disturbance.  Respiratory: Negative for cough, shortness of breath and wheezing.   Cardiovascular: Negative for leg swelling.  Gastrointestinal: Negative for abdominal pain, constipation and abdominal distention.  Genitourinary: Negative for urgency and frequency.  Musculoskeletal: Negative for joint swelling and arthralgias.  Skin: Negative for color change and rash.  Neurological: Negative for weakness and light-headedness.  Hematological: Negative for adenopathy.  Psychiatric/Behavioral: Negative for behavioral problems.   Past Medical History  Diagnosis Date  . Hyperlipidemia   . Allergy   . BPH (benign prostatic hypertrophy)    Past Surgical History  Procedure Date  . Vasectomy     reports that he has never smoked. He has never used smokeless tobacco. He reports that he drinks alcohol. He reports that he does not use illicit drugs. family history includes Alcohol abuse in his mother; Hyperlipidemia in his father and mother; and Hypertension in his father and mother. Allergies  Allergen Reactions  . Penicillins   . Rosuvastatin       REACTION: myalgia       Objective:   Physical Exam  Nursing note and vitals reviewed. Constitutional: He is oriented to person, place, and time. He appears well-developed and well-nourished.  HENT:  Head: Normocephalic and atraumatic.  Eyes: Conjunctivae are normal. Pupils are equal, round, and reactive to light.  Neck: Normal range of motion. Neck supple.  Cardiovascular: Normal rate and regular rhythm.   Pulmonary/Chest: Effort normal and breath sounds normal.  Abdominal: Soft. Bowel sounds are normal.  Genitourinary: Rectum normal and prostate normal.  Musculoskeletal: Normal range of motion.  Neurological: He is alert and oriented to person, place, and time.  Skin: Skin is warm and dry.  Psychiatric: He has a normal mood and affect. His behavior is normal.          Assessment & Plan:  We will decrease his denies a prothrombin 20-10 cutting his dose in half will monitor potassium and liver functions today to see if this was caused by his fasting state.  If his potassium continues to remain elevated he will be deemed intolerant of an ACE inhibitor and we'll have to discontinue the ACE inhibitor.   Patient presents for yearly preventative medicine examination.   all immunizations and health maintenance protocols were reviewed with the patient and they are up to date with these protocols.   screening laboratory values were reviewed with the patient including screening of hyperlipidemia PSA renal function and hepatic function.   There medications past medical history social history problem list and allergies were reviewed in detail.   Goals were established with regard to weight loss  exercise diet in compliance with medications

## 2011-05-19 ENCOUNTER — Encounter: Payer: Self-pay | Admitting: Internal Medicine

## 2011-05-19 ENCOUNTER — Ambulatory Visit (INDEPENDENT_AMBULATORY_CARE_PROVIDER_SITE_OTHER): Payer: BC Managed Care – PPO | Admitting: Internal Medicine

## 2011-05-19 VITALS — BP 110/76 | HR 68 | Temp 98.3°F | Resp 14 | Ht 68.5 in | Wt 188.0 lb

## 2011-05-19 DIAGNOSIS — E291 Testicular hypofunction: Secondary | ICD-10-CM

## 2011-05-19 DIAGNOSIS — G4733 Obstructive sleep apnea (adult) (pediatric): Secondary | ICD-10-CM

## 2011-05-19 DIAGNOSIS — F411 Generalized anxiety disorder: Secondary | ICD-10-CM

## 2011-05-19 DIAGNOSIS — F419 Anxiety disorder, unspecified: Secondary | ICD-10-CM

## 2011-05-19 DIAGNOSIS — I10 Essential (primary) hypertension: Secondary | ICD-10-CM

## 2011-05-19 MED ORDER — CITALOPRAM HYDROBROMIDE 20 MG PO TABS: 20.0000 mg | ORAL_TABLET | Freq: Every day | ORAL | Status: DC

## 2011-05-19 NOTE — Patient Instructions (Signed)
Refer to Judithe Modest

## 2011-05-19 NOTE — Progress Notes (Signed)
Subjective:    Patient ID: Joseph Drake, male    DOB: [birth year], 51 y.o.   MRN: [id number]  HPI  Patient presents for followup of essential hypertension. Currently he is on benazepril 5  mg by mouth daily he had an echocardiogram in the past which showed some left ventricular hypertrophy without systolic dysfunction. We plan to repeat the echocardiogram at the two-year anniversary to see if the antihypertensive therapy has resulted in improvement in the left ventricular hypertrophy. The working diagnosis was labile hypertension since at rest hypertension has been generally normal.  The sleep apnea may have played a role in the LVH. Weight gain noted Increased anxiety   Review of Systems  Constitutional: Negative for fever and fatigue.  HENT: Negative for hearing loss, congestion, neck pain and postnasal drip.   Eyes: Negative for discharge, redness and visual disturbance.  Respiratory: Negative for cough, shortness of breath and wheezing.   Cardiovascular: Negative for leg swelling.  Gastrointestinal: Negative for abdominal pain, constipation and abdominal distention.  Genitourinary: Negative for urgency and frequency.  Musculoskeletal: Negative for joint swelling and arthralgias.  Skin: Negative for color change and rash.  Neurological: Negative for weakness and light-headedness.  Hematological: Negative for adenopathy.  Psychiatric/Behavioral: Negative for behavioral problems.       Past Medical History  Diagnosis Date  . Hyperlipidemia   . Allergy   . BPH (benign prostatic hypertrophy)     History   Social History  . Marital Status: Married    Spouse Name: N/A    Number of Children: N/A  . Years of Education: N/A   Occupational History  . Not on file.   Social History Main Topics  . Smoking status: Never Smoker   . Smokeless tobacco: Never Used  . Alcohol Use: Yes  . Drug Use: No  . Sexually Active: Yes   Other Topics Concern  . Not on file   Social  History Narrative  . No narrative on file    Past Surgical History  Procedure Date  . Vasectomy     Family History  Problem Relation Age of Onset  . Alcohol abuse Mother   . Hyperlipidemia Mother   . Hypertension Mother   . Hypertension Father   . Hyperlipidemia Father     Allergies  Allergen Reactions  . Penicillins   . Rosuvastatin     REACTION: myalgia    Current Outpatient Prescriptions on File Prior to Visit  Medication Sig Dispense Refill  . AVODART 0.5 MG capsule TAKE ONE CAPSULE EVERY DAY  30 capsule  10  . benazepril (LOTENSIN) 10 MG tablet Take 0.5 tablets (5 mg total) by mouth daily.      . valACYclovir (VALTREX) 500 MG tablet TAKE 1 TABLET EVERY DAY  30 tablet  10  . VYTORIN 10-20 MG per tablet TAKE 1 TABLET EVERY DAY  30 tablet  7    BP 110/76  Pulse 68  Temp 98.3 F (36.8 C)  Resp 14  Ht 5' 8.5" (1.74 m)  Wt 188 lb (85.276 kg)  BMI 28.17 kg/m2    Objective:   Physical Exam  Nursing note and vitals reviewed. Constitutional: He appears well-developed and well-nourished.  HENT:  Head: Normocephalic and atraumatic.  Eyes: Conjunctivae are normal. Pupils are equal, round, and reactive to light.  Neck: Normal range of motion. Neck supple.  Cardiovascular: Normal rate and regular rhythm.   Pulmonary/Chest: Effort normal and breath sounds normal.  Abdominal: Soft. Bowel sounds are normal.  Assessment & Plan:  The blood pressure is stable The weight gain noted was adressed. Lipids are stable Echo and CPX in NOV. Monitor ED Anxiety and depression  Refer to [facility]

## 2011-06-06 ENCOUNTER — Other Ambulatory Visit: Payer: Self-pay

## 2011-06-06 DIAGNOSIS — I10 Essential (primary) hypertension: Secondary | ICD-10-CM

## 2011-06-06 MED ORDER — BENAZEPRIL HCL 10 MG PO TABS: 5.0000 mg | ORAL_TABLET | Freq: Every day | ORAL | Status: DC

## 2011-06-13 ENCOUNTER — Encounter: Payer: Self-pay | Admitting: Family

## 2011-06-13 ENCOUNTER — Ambulatory Visit (INDEPENDENT_AMBULATORY_CARE_PROVIDER_SITE_OTHER): Payer: BC Managed Care – PPO | Admitting: Family

## 2011-06-13 VITALS — BP 120/80 | Temp 98.3°F | Wt 193.0 lb

## 2011-06-13 DIAGNOSIS — N400 Enlarged prostate without lower urinary tract symptoms: Secondary | ICD-10-CM

## 2011-06-13 DIAGNOSIS — J019 Acute sinusitis, unspecified: Secondary | ICD-10-CM

## 2011-06-13 MED ORDER — AZITHROMYCIN 250 MG PO TABS: ORAL_TABLET | ORAL | Status: AC

## 2011-06-13 NOTE — Progress Notes (Deleted)
  Subjective:    Patient ID: Joseph Drake, male    DOB: Dec 04, 1960, 51 y.o.   MRN: [id number]  HPI    Review of Systems     Objective:   Physical Exam        Assessment & Plan:

## 2011-06-13 NOTE — Progress Notes (Signed)
Subjective:    Patient ID: Joseph Drake, male    DOB: [birth year], 51 y.o.   MRN: [id number]  HPI Comments: C/o sinus drainage, sorethroat, and green-brown-bloody nasal drainage x 3-4 weeks. Denies headaches, fever, chills, or nausea/vomiting. Took OTC mucinex and neti pot without any relief.   Sinusitis Associated symptoms include congestion. Pertinent negatives include no chills, ear pain, neck pain, sinus pressure or sneezing.    Has a history of BPH and has been having a decrease in urinary flow for about 1 week. Denies burning with urination, blood in urine, low back pain, or abdominal pain.   Review of Systems  Constitutional: Positive for fatigue. Negative for fever, chills, activity change and appetite change.  HENT: Positive for congestion, rhinorrhea and postnasal drip. Negative for hearing loss, ear pain, nosebleeds, facial swelling, sneezing, neck pain, sinus pressure, tinnitus and ear discharge.   Eyes: Negative.   Respiratory: Negative.   Cardiovascular: Negative.   Psychiatric/Behavioral: Positive for agitation.   Past Medical History  Diagnosis Date  . Hyperlipidemia   . Allergy   . BPH (benign prostatic hypertrophy)     History   Social History  . Marital Status: Married    Spouse Name: N/A    Number of Children: N/A  . Years of Education: N/A   Occupational History  . Not on file.   Social History Main Topics  . Smoking status: Never Smoker   . Smokeless tobacco: Never Used  . Alcohol Use: Yes  . Drug Use: No  . Sexually Active: Yes   Other Topics Concern  . Not on file   Social History Narrative  . No narrative on file    Past Surgical History  Procedure Date  . Vasectomy     Family History  Problem Relation Age of Onset  . Alcohol abuse Mother   . Hyperlipidemia Mother   . Hypertension Mother   . Hypertension Father   . Hyperlipidemia Father     Allergies  Allergen Reactions  . Penicillins   . Rosuvastatin     REACTION:  myalgia    Current Outpatient Prescriptions on File Prior to Visit  Medication Sig Dispense Refill  . AVODART 0.5 MG capsule TAKE ONE CAPSULE EVERY DAY  30 capsule  10  . benazepril (LOTENSIN) 10 MG tablet Take 0.5 tablets (5 mg total) by mouth daily.  30 tablet  11  . citalopram (CELEXA) 20 MG tablet Take 1 tablet (20 mg total) by mouth daily.  30 tablet  3  . valACYclovir (VALTREX) 500 MG tablet TAKE 1 TABLET EVERY DAY  30 tablet  10  . VYTORIN 10-20 MG per tablet TAKE 1 TABLET EVERY DAY  30 tablet  7    BP 120/80  Temp(Src) 98.3 F (36.8 C) (Oral)  Wt 193 lb (87.544 kg)chart     Objective:   Physical Exam  Constitutional: He is oriented to person, place, and time. He appears well-developed and well-nourished. No distress.  HENT:  Right Ear: External ear normal.  Left Ear: External ear normal.  Nose: Nose normal.  Mouth/Throat: Oropharynx is clear and moist. No oropharyngeal exudate.  Eyes: Right eye exhibits no discharge. Left eye exhibits no discharge.  Cardiovascular: Normal rate, regular rhythm, normal heart sounds and intact distal pulses.  Exam reveals no gallop and no friction rub.   No murmur heard. Pulmonary/Chest: Effort normal and breath sounds normal. No respiratory distress. He has no wheezes. He has no rales. He exhibits no tenderness.  Neurological: He is alert and oriented to person, place, and time.  Skin: Skin is warm and dry. He is not diaphoretic.          Assessment & Plan:  Assessment: Sinusitis, BPH  Plan: azithromycin, increase po fluids, OTC zyrtec, teaching handout provided, encouraged to RTC if s/s do not resolve within one week or if s/s worsen.  Stop Mucinex DM, in efforts of improving urinary symptoms.

## 2011-06-13 NOTE — Patient Instructions (Signed)

## 2011-06-18 ENCOUNTER — Telehealth: Payer: Self-pay | Admitting: Internal Medicine

## 2011-06-18 NOTE — Telephone Encounter (Signed)
Referral called to jennifer young-- pt informed and pt will call back if no word by [facility]

## 2011-06-18 NOTE — Telephone Encounter (Signed)
Patient called stating he was supposed to be referred to Judithe Modest. Please assist and inform patient.

## 2011-06-25 ENCOUNTER — Ambulatory Visit: Payer: BC Managed Care – PPO | Admitting: Licensed Clinical Social Worker

## 2011-07-05 ENCOUNTER — Other Ambulatory Visit: Payer: Self-pay | Admitting: Internal Medicine

## 2011-07-15 ENCOUNTER — Ambulatory Visit: Payer: BC Managed Care – PPO | Admitting: Internal Medicine

## 2011-10-24 ENCOUNTER — Ambulatory Visit (INDEPENDENT_AMBULATORY_CARE_PROVIDER_SITE_OTHER): Payer: BC Managed Care – PPO | Admitting: Internal Medicine

## 2011-10-24 ENCOUNTER — Encounter: Payer: Self-pay | Admitting: Internal Medicine

## 2011-10-24 VITALS — BP 120/78 | HR 72 | Temp 98.6°F | Resp 16 | Ht 68.5 in | Wt 188.0 lb

## 2011-10-24 DIAGNOSIS — N139 Obstructive and reflux uropathy, unspecified: Secondary | ICD-10-CM

## 2011-10-24 DIAGNOSIS — N4281 Prostatodynia syndrome: Secondary | ICD-10-CM

## 2011-10-24 DIAGNOSIS — N401 Enlarged prostate with lower urinary tract symptoms: Secondary | ICD-10-CM

## 2011-10-24 DIAGNOSIS — N138 Other obstructive and reflux uropathy: Secondary | ICD-10-CM

## 2011-10-24 DIAGNOSIS — N4289 Other specified disorders of prostate: Secondary | ICD-10-CM

## 2011-10-24 DIAGNOSIS — N411 Chronic prostatitis: Secondary | ICD-10-CM

## 2011-10-24 DIAGNOSIS — G894 Chronic pain syndrome: Secondary | ICD-10-CM

## 2011-10-24 MED ORDER — FINASTERIDE 5 MG PO TABS: 5.0000 mg | ORAL_TABLET | Freq: Every day | ORAL | Status: DC

## 2011-10-24 MED ORDER — LEVOFLOXACIN 250 MG PO TABS: 250.0000 mg | ORAL_TABLET | Freq: Every day | ORAL | Status: AC

## 2011-10-24 NOTE — Patient Instructions (Signed)
The patient is instructed to continue all medications as prescribed. Schedule followup with check out clerk upon leaving the clinic  

## 2011-10-24 NOTE — Progress Notes (Signed)
Subjective:    Patient ID: Joseph Drake, male    DOB: 1961/03/07, 51 y.o.   MRN: [id number]  HPI Patient is a 51 year old male with a history of prostatodynia history of enlarged prostate due to BPH and the presentation today with intermittent severe pain in his prostate without dysuria fever concurrent back pain.  He has noticed some decreased flow but he does not believe that this has been significant.  He detects no fever or chills has noticed no discharge or has ejaculatory pain   Review of Systems  Constitutional: Negative for fever and fatigue.  HENT: Negative for hearing loss, congestion, neck pain and postnasal drip.   Eyes: Negative for discharge, redness and visual disturbance.  Respiratory: Negative for cough, shortness of breath and wheezing.   Cardiovascular: Negative for leg swelling.  Gastrointestinal: Negative for abdominal pain, constipation and abdominal distention.  Genitourinary: Positive for dysuria and scrotal swelling. Negative for urgency and frequency.  Musculoskeletal: Positive for back pain. Negative for joint swelling and arthralgias.  Skin: Negative for color change and rash.  Neurological: Negative for weakness and light-headedness.  Hematological: Negative for adenopathy.  Psychiatric/Behavioral: Negative for behavioral problems.   Past Medical History  Diagnosis Date  . Hyperlipidemia   . Allergy   . BPH (benign prostatic hypertrophy)     History   Social History  . Marital Status: Married    Spouse Name: N/A    Number of Children: N/A  . Years of Education: N/A   Occupational History  . Not on file.   Social History Main Topics  . Smoking status: Never Smoker   . Smokeless tobacco: Never Used  . Alcohol Use: Yes  . Drug Use: No  . Sexually Active: Yes   Other Topics Concern  . Not on file   Social History Narrative  . No narrative on file    Past Surgical History  Procedure Date  . Vasectomy     Family History    Problem Relation Age of Onset  . Alcohol abuse Mother   . Hyperlipidemia Mother   . Hypertension Mother   . Hypertension Father   . Hyperlipidemia Father     Allergies  Allergen Reactions  . Penicillins   . Rosuvastatin     REACTION: myalgia    Current Outpatient Prescriptions on File Prior to Visit  Medication Sig Dispense Refill  . AVODART 0.5 MG capsule TAKE ONE CAPSULE EVERY DAY  30 capsule  10  . benazepril (LOTENSIN) 10 MG tablet Take 0.5 tablets (5 mg total) by mouth daily.  30 tablet  11  . valACYclovir (VALTREX) 500 MG tablet TAKE 1 TABLET EVERY DAY  30 tablet  10  . VYTORIN 10-20 MG per tablet TAKE 1 TABLET EVERY DAY  30 tablet  7    BP 120/78  Pulse 72  Temp 98.6 F (37 C)  Resp 16  Ht 5' 8.5" (1.74 m)  Wt 188 lb (85.276 kg)  BMI 28.17 kg/m2       Objective:   Physical Exam  Nursing note and vitals reviewed. Constitutional: He appears well-developed and well-nourished.  HENT:  Head: Normocephalic and atraumatic.  Eyes: Conjunctivae are normal. Pupils are equal, round, and reactive to light.  Neck: Normal range of motion. Neck supple.  Cardiovascular: Normal rate and regular rhythm.   Pulmonary/Chest: Effort normal and breath sounds normal.  Abdominal: Soft. Bowel sounds are normal.  Genitourinary:       Prostate +2 enlarged and boggy especially  in the left lobe          Assessment & Plan:  Prostatodynia most probably secondary to acute prostate on chronic prostate infection with a history of chronic prostatodynia and BPH Change back to post scar from Avodart treat with Levaquin 250  For 21 days

## 2011-12-30 ENCOUNTER — Other Ambulatory Visit: Payer: Self-pay | Admitting: Internal Medicine

## 2012-04-07 ENCOUNTER — Other Ambulatory Visit: Payer: Self-pay | Admitting: Internal Medicine

## 2012-07-11 ENCOUNTER — Other Ambulatory Visit: Payer: Self-pay | Admitting: Internal Medicine

## 2012-09-16 ENCOUNTER — Telehealth: Payer: Self-pay | Admitting: Cardiovascular Disease

## 2012-09-16 NOTE — Telephone Encounter (Signed)
I spoke with the pt and he complained of a rapid fluttering sensation in his chest the past couple of months. The pt feels like his symptoms have increased in frequency and is now happening on a daily basis and sometimes multiple times a day. The pt denies CP and SOB with symptoms. The pt did call his PCP and they instructed him to contact our office for an appointment. The pt does notice that his palpitations are worse when he is tired.  This pt did see Dr Clifton James in 2011 for a GXT and then had an Echocardiogram. I have scheduled the pt to see Norma Fredrickson NP on 09/17/12.

## 2012-09-16 NOTE — Telephone Encounter (Signed)
New problem    C/O flutter in chest for couple of months now . No sob. More frequent .    Morning meds was taken.

## 2012-09-17 ENCOUNTER — Encounter: Payer: Self-pay | Admitting: Nurse Practitioner

## 2012-09-17 ENCOUNTER — Ambulatory Visit (INDEPENDENT_AMBULATORY_CARE_PROVIDER_SITE_OTHER): Payer: PRIVATE HEALTH INSURANCE | Admitting: Nurse Practitioner

## 2012-09-17 ENCOUNTER — Encounter (INDEPENDENT_AMBULATORY_CARE_PROVIDER_SITE_OTHER): Payer: PRIVATE HEALTH INSURANCE

## 2012-09-17 VITALS — BP 120/76 | HR 78 | Ht 69.0 in | Wt 197.0 lb

## 2012-09-17 DIAGNOSIS — R002 Palpitations: Secondary | ICD-10-CM

## 2012-09-17 DIAGNOSIS — R5381 Other malaise: Secondary | ICD-10-CM

## 2012-09-17 DIAGNOSIS — R5383 Other fatigue: Secondary | ICD-10-CM

## 2012-09-17 DIAGNOSIS — I10 Essential (primary) hypertension: Secondary | ICD-10-CM

## 2012-09-17 LAB — CBC WITH DIFFERENTIAL/PLATELET
Basophils Absolute: 0 10*3/uL (ref 0.0–0.1)
Basophils Relative: 0.5 % (ref 0.0–3.0)
Eosinophils Absolute: 0.2 10*3/uL (ref 0.0–0.7)
Eosinophils Relative: 2.7 % (ref 0.0–5.0)
HCT: 45.7 % (ref 39.0–52.0)
Hemoglobin: 15.3 g/dL (ref 13.0–17.0)
Lymphocytes Relative: 20.4 % (ref 12.0–46.0)
Lymphs Abs: 1.6 10*3/uL (ref 0.7–4.0)
MCHC: 33.5 g/dL (ref 30.0–36.0)
MCV: 90.4 fl (ref 78.0–100.0)
Monocytes Absolute: 0.6 10*3/uL (ref 0.1–1.0)
Monocytes Relative: 7.1 % (ref 3.0–12.0)
Neutro Abs: 5.5 10*3/uL (ref 1.4–7.7)
Neutrophils Relative %: 69.3 % (ref 43.0–77.0)
Platelets: 220 10*3/uL (ref 150.0–400.0)
RBC: 5.05 Mil/uL (ref 4.22–5.81)
RDW: 12.9 % (ref 11.5–14.6)
WBC: 7.9 10*3/uL (ref 4.5–10.5)

## 2012-09-17 LAB — BASIC METABOLIC PANEL
BUN: 15 mg/dL (ref 6–23)
CO2: 30 mEq/L (ref 19–32)
Calcium: 9.4 mg/dL (ref 8.4–10.5)
Chloride: 103 mEq/L (ref 96–112)
Creatinine, Ser: 1.3 mg/dL (ref 0.4–1.5)
GFR: 64.23 mL/min (ref >60.00)
Glucose, Bld: 90 mg/dL (ref 70–99)
Potassium: 4.4 mEq/L (ref 3.5–5.1)
Sodium: 139 mEq/L (ref 135–145)

## 2012-09-17 LAB — TSH: TSH: 1.12 u[IU]/mL (ref 0.35–5.50)

## 2012-09-17 NOTE — Progress Notes (Signed)
Joseph Drake Drake Date of Birth: 1960-11-09 Medical Record #[id number]  History of Present Illness: Joseph Drake Drake is seen today for a work in visit. Seen for Joseph Drake Drake. No cardiac history. Has had a GXT and echo back in 2011. His echo was basically normal with just mild LVH. Not seen here since. Other issues include BPH, HLD and HTN.   Called earlier this week to complain of palpitations. He describes a rapid fluttering sensation in his chest over the past couple of months - now increasing in frequency - happens daily. No chest pain or shortness of breath. He alerted his PCP who told him to call here and he was thus added to my schedule.   He comes in today. He is here alone. He describes "flutters" in the center of his chest. It has been going on for a couple of months. Getting worse and happens every day. No precipitating event and nothing he can do to make it stop. Not really dizzy but notes feeling "wierd" with the spells. Has more if he gets overly tired. No chest pain. Not short of breath. Trying to minimize his caffeine but has noted to improvement. Does not use alcohol. One spell happened with walking. No recent labs. He does not smoke. Parents are still alive.    Current Outpatient Prescriptions on File Prior to Visit  Medication Sig Dispense Refill  . benazepril (LOTENSIN) 10 MG tablet TAKE 1/2 TABLET DAILY  30 tablet  5  . finasteride (PROSCAR) 5 MG tablet Take 1 tablet (5 mg total) by mouth daily.  90 tablet  3  . valACYclovir (VALTREX) 500 MG tablet TAKE 1 TABLET EVERY DAY  30 tablet  10  . VYTORIN 10-20 MG per tablet TAKE 1 TABLET EVERY DAY  30 tablet  7   No current facility-administered medications on file prior to visit.    Allergies  Allergen Reactions  . Penicillins   . Rosuvastatin     REACTION: myalgia    Past Medical History  Diagnosis Date  . Hyperlipidemia   . Allergy   . BPH (benign prostatic hypertrophy)   . Palpitation     Past Surgical History    Procedure Laterality Date  . Vasectomy      History  Smoking status  . Never Smoker   Smokeless tobacco  . Never Used    History  Alcohol Use  . Yes    Family History  Problem Relation Age of Onset  . Alcohol abuse Mother   . Hyperlipidemia Mother   . Hypertension Mother   . Hypertension Father   . Hyperlipidemia Father     Review of Systems: The review of systems is per the HPI.  All other systems were reviewed and are negative.  Physical Exam: BP 120/76  Pulse 78  Ht 5\' 9"  (1.753 m)  Wt 197 lb (89.359 kg)  BMI 29.08 kg/m2 Patient is very pleasant and in no acute distress. Skin is warm and dry. Color is normal.  HEENT is unremarkable. Normocephalic/atraumatic. PERRL. Sclera are nonicteric. Neck is supple. No masses. No JVD. Lungs are clear. Cardiac exam shows a regular rate and rhythm. Abdomen is soft. Extremities are without edema. Gait and ROM are intact. No gross neurologic deficits noted.  LABORATORY DATA:  EKG today shows sinus rhythm with RSR'. No acute changes.   Lab Results  Component Value Date   WBC 6.3 01/13/2011   HGB 14.9 01/13/2011   HCT 44.5 01/13/2011   PLT 200.0 01/13/2011  GLUCOSE 86 01/20/2011   CHOL 133 01/13/2011   TRIG 79.0 01/13/2011   HDL 41.10 01/13/2011   LDLDIRECT 184.4 03/10/2007   LDLCALC 76 01/13/2011   ALT 39 01/20/2011   AST 29 01/20/2011   NA 141 01/20/2011   K 5.1 01/20/2011   CL 104 01/20/2011   CREATININE 1.2 01/20/2011   BUN 16 01/20/2011   CO2 30 01/20/2011   TSH 1.58 01/13/2011   PSA 0.19 01/13/2011   Echo Study Conclusions from 2011  - Left ventricle: The cavity size was normal. Wall thickness was increased in a pattern of mild LVH. Systolic function was normal. The estimated ejection fraction was in the range of 55% to 60%. Wall motion was normal; there were no regional wall motion abnormalities. Left ventricular diastolic function parameters were normal. - Mitral valve: Mild regurgitation.   Assessment / Plan:  1.  Palpitations - worrisome to the patient. Will check some baseline labs. This is happening every day. Will place 24 hour Holter.  I have asked him to minimize his caffeine use. I don't think we need to update the echo at this point. Further disposition to follow.  2. HTN - BP looks great.   3. HLD - on statin therapy. He is due to have his lipids checked in October.   Patient is agreeable to this plan and will call if any problems develop in the interim.   Rosalio Macadamia, RN, ANP-C [facility] [address] [place], [place]  [address]

## 2012-09-17 NOTE — Patient Instructions (Signed)
Minimize your caffeine intake  We are going to check labs today  We are going to place a heart monitor for the next 24 hours. Bring this back on Tuesday.   For now, stay on your current medicines  Call the [facility] Care office at [phone] if you have any questions, problems or concerns.

## 2012-09-24 ENCOUNTER — Telehealth: Payer: Self-pay | Admitting: *Deleted

## 2012-09-24 ENCOUNTER — Other Ambulatory Visit: Payer: Self-pay | Admitting: *Deleted

## 2012-09-24 ENCOUNTER — Telehealth: Payer: Self-pay | Admitting: Nurse Practitioner

## 2012-09-24 DIAGNOSIS — I4892 Unspecified atrial flutter: Secondary | ICD-10-CM

## 2012-09-24 MED ORDER — PROPRANOLOL HCL 10 MG PO TABS: 10.0000 mg | ORAL_TABLET | Freq: Four times a day (QID) | ORAL | Status: DC

## 2012-09-24 NOTE — Telephone Encounter (Signed)
New problem   Pt need to talk to you concerning his lab results. Please call pt.

## 2012-09-24 NOTE — Telephone Encounter (Signed)
Pt stated is it ok to exercise, Lawson Fiscal said as he feels, yes

## 2012-09-24 NOTE — Telephone Encounter (Signed)
S//w pt feels ok had some fluttering last nite would like to come in t/w Lawson Fiscal will start inderal ( 10 mg ) qid prn and see pt back in 4 weeks I will call and make appt.

## 2012-09-24 NOTE — Telephone Encounter (Signed)
Pt agreed with plan of starting inderal ( 10 mg ) qid prn for fluttering and see Norma Fredrickson, 6/27 for a f/u sent script in [facility] [address] in [place]

## 2012-10-08 NOTE — Progress Notes (Signed)
ok 

## 2012-10-22 ENCOUNTER — Encounter: Payer: Self-pay | Admitting: Nurse Practitioner

## 2012-10-22 ENCOUNTER — Ambulatory Visit (INDEPENDENT_AMBULATORY_CARE_PROVIDER_SITE_OTHER): Payer: PRIVATE HEALTH INSURANCE | Admitting: Nurse Practitioner

## 2012-10-22 VITALS — BP 100/60 | HR 68 | Ht 69.0 in | Wt 197.0 lb

## 2012-10-22 DIAGNOSIS — R002 Palpitations: Secondary | ICD-10-CM

## 2012-10-22 NOTE — Progress Notes (Signed)
Joseph Drake Date of Birth: 1961/03/16 Medical Record #[id number]  History of Present Illness: Joseph Drake is seen back today for a follow up visit. Seen for Joseph Drake. He has no real cardiac history. Has had prior GXT and echo in 2011. Echo was basically normal with mild LVH. Other issues include BPH, HLD and HTN.   Seen a month ago with palpitations. Holter was placed - this showed sinus rhythm, PVC's and rare PACs. No significant arrhythmia noted. Was still having some palpitations and we started him on prn Inderal.  Comes back today. He is here alone. He is doing ok. Still has some "flutters". Notes it more at night and more when he is tired or stressed. No chest pain. Not short of breath. Not dizzy or lightheaded. Trying to limit caffeine. Not exercising as much as he should due to an upcoming move/change in residence. He is using the Inderal as needed with good results. He is not interested in chronic/daily therapy.   Current Outpatient Prescriptions  Medication Sig Dispense Refill  . benazepril (LOTENSIN) 10 MG tablet TAKE 1/2 TABLET DAILY  30 tablet  5  . finasteride (PROSCAR) 5 MG tablet Take 1 tablet (5 mg total) by mouth daily.  90 tablet  3  . propranolol (INDERAL) 10 MG tablet Take 1 tablet (10 mg total) by mouth 4 (four) times daily. As needed for fluttering  60 tablet  1  . valACYclovir (VALTREX) 500 MG tablet TAKE 1 TABLET EVERY DAY  30 tablet  10  . VYTORIN 10-20 MG per tablet TAKE 1 TABLET EVERY DAY  30 tablet  7   No current facility-administered medications for this visit.    Allergies  Allergen Reactions  . Penicillins   . Rosuvastatin     REACTION: myalgia    Past Medical History  Diagnosis Date  . Hyperlipidemia   . Allergy   . BPH (benign prostatic hypertrophy)   . Palpitation     Holter in May 2014 with NSR, PVC, rare PACs    Past Surgical History  Procedure Laterality Date  . Vasectomy      History  Smoking status  . Never Smoker     Smokeless tobacco  . Never Used    History  Alcohol Use  . Yes    Family History  Problem Relation Age of Onset  . Alcohol abuse Mother   . Hyperlipidemia Mother   . Hypertension Mother   . Hypertension Father   . Hyperlipidemia Father     Review of Systems: The review of systems is per the HPI.  All other systems were reviewed and are negative.  Physical Exam: BP 100/60  Pulse 68  Ht 5\' 9"  (1.753 m)  Wt 197 lb (89.359 kg)  BMI 29.08 kg/m2 Patient is very pleasant and in no acute distress. Skin is warm and dry. Color is normal.  HEENT is unremarkable. Normocephalic/atraumatic. PERRL. Sclera are nonicteric. Neck is supple. No masses. No JVD. Lungs are clear. Cardiac exam shows a regular rate and rhythm. Abdomen is soft. Extremities are without edema. Gait and ROM are intact. No gross neurologic deficits noted.  LABORATORY DATA: Lab Results  Component Value Date   WBC 7.9 09/17/2012   HGB 15.3 09/17/2012   HCT 45.7 09/17/2012   PLT 220.0 09/17/2012   GLUCOSE 90 09/17/2012   CHOL 133 01/13/2011   TRIG 79.0 01/13/2011   HDL 41.10 01/13/2011   LDLDIRECT 184.4 03/10/2007   LDLCALC 76 01/13/2011  ALT 39 01/20/2011   AST 29 01/20/2011   NA 139 09/17/2012   K 4.4 09/17/2012   CL 103 09/17/2012   CREATININE 1.3 09/17/2012   BUN 15 09/17/2012   CO2 30 09/17/2012   TSH 1.12 09/17/2012   PSA 0.19 01/13/2011     Assessment / Plan: 1. Palpitations with recent Holter showing sinus rhythm, PVCs and rare PAC's - now on prn Inderal - doing well. No change in treatment. We have discussed that the PVCs are benign but can be annoying. Restricting caffeine and exercise is encouraged. Will see him back prn.   2. HTN - great BP control.   Patient is agreeable to this plan and will call if any problems develop in the interim.   Rosalio Macadamia, RN, ANP-C [facility] [address] [place], [place]  [address]

## 2012-10-22 NOTE — Patient Instructions (Addendum)
Use the Inderal as you need to  Limit your caffeine  Exercise as you can  We will see you back as needed  Call the [facility] office at [phone] if you have any questions, problems or concerns.

## 2012-11-16 ENCOUNTER — Other Ambulatory Visit: Payer: Self-pay | Admitting: Internal Medicine

## 2012-12-19 ENCOUNTER — Other Ambulatory Visit: Payer: Self-pay | Admitting: Internal Medicine

## 2013-02-21 ENCOUNTER — Other Ambulatory Visit (INDEPENDENT_AMBULATORY_CARE_PROVIDER_SITE_OTHER): Payer: PRIVATE HEALTH INSURANCE

## 2013-02-21 DIAGNOSIS — Z0000 Encounter for general adult medical examination without abnormal findings: Secondary | ICD-10-CM

## 2013-02-21 LAB — LIPID PANEL
Cholesterol: 156 mg/dL (ref 0–200)
HDL: 34.8 mg/dL — ABNORMAL LOW (ref >39.00)
LDL Cholesterol: 88 mg/dL (ref 0–99)
Total CHOL/HDL Ratio: 4
Triglycerides: 165 mg/dL — ABNORMAL HIGH (ref 0.0–149.0)
VLDL: 33 mg/dL (ref 0.0–40.0)

## 2013-02-21 LAB — POCT URINALYSIS DIPSTICK
Bilirubin, UA: NEGATIVE
Blood, UA: NEGATIVE
Glucose, UA: NEGATIVE
Ketones, UA: NEGATIVE
Leukocytes, UA: NEGATIVE
Nitrite, UA: NEGATIVE
Protein, UA: NEGATIVE
Spec Grav, UA: 1.01
Urobilinogen, UA: 0.2
pH, UA: 7

## 2013-02-21 LAB — CBC WITH DIFFERENTIAL/PLATELET
Basophils Absolute: 0 10*3/uL (ref 0.0–0.1)
Basophils Relative: 0.6 % (ref 0.0–3.0)
Eosinophils Absolute: 0.2 10*3/uL (ref 0.0–0.7)
Eosinophils Relative: 2.4 % (ref 0.0–5.0)
HCT: 45 % (ref 39.0–52.0)
Hemoglobin: 15.4 g/dL (ref 13.0–17.0)
Lymphocytes Relative: 21.1 % (ref 12.0–46.0)
Lymphs Abs: 1.4 10*3/uL (ref 0.7–4.0)
MCHC: 34.1 g/dL (ref 30.0–36.0)
MCV: 90.1 fl (ref 78.0–100.0)
Monocytes Absolute: 0.5 10*3/uL (ref 0.1–1.0)
Monocytes Relative: 7.9 % (ref 3.0–12.0)
Neutro Abs: 4.5 10*3/uL (ref 1.4–7.7)
Neutrophils Relative %: 68 % (ref 43.0–77.0)
Platelets: 194 10*3/uL (ref 150.0–400.0)
RBC: 4.99 Mil/uL (ref 4.22–5.81)
RDW: 13.2 % (ref 11.5–14.6)
WBC: 6.6 10*3/uL (ref 4.5–10.5)

## 2013-02-21 LAB — BASIC METABOLIC PANEL
BUN: 16 mg/dL (ref 6–23)
CO2: 29 mEq/L (ref 19–32)
Calcium: 9.1 mg/dL (ref 8.4–10.5)
Chloride: 103 mEq/L (ref 96–112)
Creatinine, Ser: 1.1 mg/dL (ref 0.4–1.5)
GFR: 72.04 mL/min (ref >60.00)
Glucose, Bld: 96 mg/dL (ref 70–99)
Potassium: 4.7 mEq/L (ref 3.5–5.1)
Sodium: 141 mEq/L (ref 135–145)

## 2013-02-21 LAB — HEPATIC FUNCTION PANEL
ALT: 37 U/L (ref 0–53)
AST: 28 U/L (ref 0–37)
Albumin: 4.2 g/dL (ref 3.5–5.2)
Alkaline Phosphatase: 85 U/L (ref 39–117)
Bilirubin, Direct: 0.1 mg/dL (ref 0.0–0.3)
Total Bilirubin: 0.8 mg/dL (ref 0.3–1.2)
Total Protein: 7.2 g/dL (ref 6.0–8.3)

## 2013-02-21 LAB — PSA: PSA: 0.2 ng/mL (ref 0.10–4.00)

## 2013-02-21 LAB — TSH: TSH: 1.96 u[IU]/mL (ref 0.35–5.50)

## 2013-02-28 ENCOUNTER — Encounter: Payer: Self-pay | Admitting: Internal Medicine

## 2013-02-28 ENCOUNTER — Ambulatory Visit (INDEPENDENT_AMBULATORY_CARE_PROVIDER_SITE_OTHER): Payer: PRIVATE HEALTH INSURANCE | Admitting: Internal Medicine

## 2013-02-28 VITALS — BP 114/80 | HR 72 | Temp 98.2°F | Resp 16 | Ht 69.0 in | Wt 202.0 lb

## 2013-02-28 DIAGNOSIS — Z0000 Encounter for general adult medical examination without abnormal findings: Secondary | ICD-10-CM

## 2013-02-28 NOTE — Progress Notes (Signed)
Subjective:    Patient ID: Joseph Drake, male    DOB: 09/16/1960, 52 y.o.   MRN: [id number]  HPI CPX Loss of 20 to 25 lbs needed  Review of Systems  Constitutional: Negative for fever and fatigue.  HENT: Negative for congestion, hearing loss and postnasal drip.   Eyes: Negative for discharge, redness and visual disturbance.  Respiratory: Negative for cough, shortness of breath and wheezing.   Cardiovascular: Negative for leg swelling.  Gastrointestinal: Negative for abdominal pain, constipation and abdominal distention.  Genitourinary: Negative for urgency and frequency.  Musculoskeletal: Negative for arthralgias, joint swelling and neck pain.  Skin: Negative for color change and rash.  Neurological: Negative for weakness and light-headedness.  Hematological: Negative for adenopathy.  Psychiatric/Behavioral: Negative for behavioral problems.   Past Medical History  Diagnosis Date  . Hyperlipidemia   . Allergy   . BPH (benign prostatic hypertrophy)   . Palpitation     Holter in May 2014 with NSR, PVC, rare PACs    History   Social History  . Marital Status: Married    Spouse Name: N/A    Number of Children: N/A  . Years of Education: N/A   Occupational History  . Not on file.   Social History Main Topics  . Smoking status: Never Smoker   . Smokeless tobacco: Never Used  . Alcohol Use: Yes  . Drug Use: No  . Sexual Activity: Yes   Other Topics Concern  . Not on file   Social History Narrative  . No narrative on file    Past Surgical History  Procedure Laterality Date  . Vasectomy      Family History  Problem Relation Age of Onset  . Alcohol abuse Mother   . Hyperlipidemia Mother   . Hypertension Mother   . Hypertension Father   . Hyperlipidemia Father     Allergies  Allergen Reactions  . Penicillins   . Rosuvastatin     REACTION: myalgia    Current Outpatient Prescriptions on File Prior to Visit  Medication Sig Dispense Refill  .  benazepril (LOTENSIN) 10 MG tablet TAKE 1/2 TABLET DAILY  30 tablet  5  . finasteride (PROSCAR) 5 MG tablet TAKE 1 TABLET (5 MG TOTAL) BY MOUTH DAILY.  90 tablet  3  . propranolol (INDERAL) 10 MG tablet Take 1 tablet (10 mg total) by mouth 4 (four) times daily. As needed for fluttering  60 tablet  1  . valACYclovir (VALTREX) 500 MG tablet TAKE 1 TABLET EVERY DAY  30 tablet  10  . VYTORIN 10-20 MG per tablet TAKE 1 TABLET EVERY DAY  30 tablet  7   No current facility-administered medications on file prior to visit.    BP 114/80  Pulse 72  Temp(Src) 98.2 F (36.8 C)  Resp 16  Ht 5\' 9"  (1.753 m)  Wt 202 lb (91.627 kg)  BMI 29.82 kg/m2        Objective:   Physical Exam  Nursing note and vitals reviewed. Constitutional: He is oriented to person, place, and time. He appears well-developed and well-nourished.  HENT:  Head: Normocephalic and atraumatic.  Eyes: Conjunctivae are normal. Pupils are equal, round, and reactive to light.  Neck: Normal range of motion. Neck supple.  Cardiovascular: Normal rate and regular rhythm.   Pulmonary/Chest: Effort normal and breath sounds normal.  Abdominal: Soft. Bowel sounds are normal.  Musculoskeletal: Normal range of motion.  Neurological: He is alert and oriented to person, place, and time.  He displays normal reflexes. He exhibits normal muscle tone. Coordination normal.  Skin: Skin is warm and dry.  Psychiatric: He has a normal mood and affect. His behavior is normal.          Assessment & Plan:  . Patient presents for yearly preventative medicine examination.   all immunizations and health maintenance protocols were reviewed with the patient and they are up to date with these protocols.   screening laboratory values were reviewed with the patient including screening of hyperlipidemia PSA renal function and hepatic function.   There medications past medical history social history problem list and allergies were reviewed in  detail.   Goals were established with regard to weight loss exercise diet in compliance with medications  Diet and exercise GERD and HTN BPH on proscar

## 2013-07-20 ENCOUNTER — Other Ambulatory Visit: Payer: Self-pay | Admitting: Internal Medicine

## 2013-08-29 ENCOUNTER — Encounter: Payer: Self-pay | Admitting: Internal Medicine

## 2013-08-29 ENCOUNTER — Ambulatory Visit (INDEPENDENT_AMBULATORY_CARE_PROVIDER_SITE_OTHER): Payer: PRIVATE HEALTH INSURANCE | Admitting: Internal Medicine

## 2013-08-29 VITALS — BP 117/77 | HR 86 | Temp 98.7°F | Wt 204.0 lb

## 2013-08-29 DIAGNOSIS — I10 Essential (primary) hypertension: Secondary | ICD-10-CM

## 2013-08-29 NOTE — Patient Instructions (Signed)
At [facility]  Dr Willaim Sheng   At [place]   Dr Charlett Blake  At [facility] station  Dr Gilford Rile

## 2013-08-29 NOTE — Progress Notes (Signed)
Pre visit review using our clinic review tool, if applicable. No additional management support is needed unless otherwise documented below in the visit note. 

## 2013-08-29 NOTE — Progress Notes (Signed)
Subjective:    Patient ID: Joseph Drake, male    DOB: 05/18/1960, 53 y.o.   MRN: [id number]  HPI  Follow up Building a new house in [place]  Works on [employer]?  Stable BPH no pain intermitant pressure Stable HTN    Review of Systems  Constitutional: Negative for fever and fatigue.  HENT: Negative for congestion, hearing loss and postnasal drip.   Eyes: Negative for discharge, redness and visual disturbance.  Respiratory: Negative for cough, shortness of breath and wheezing.   Cardiovascular: Negative for leg swelling.  Gastrointestinal: Negative for abdominal pain, constipation and abdominal distention.  Genitourinary: Negative for urgency and frequency.  Musculoskeletal: Negative for arthralgias, joint swelling and neck pain.  Skin: Negative for color change and rash.  Neurological: Negative for weakness and light-headedness.  Hematological: Negative for adenopathy.  Psychiatric/Behavioral: Negative for behavioral problems.    The patient is instructed to continue all medications as prescribed. Schedule followup with check out clerk upon leaving the clinic Past Medical History  Diagnosis Date  . Hyperlipidemia   . Allergy   . BPH (benign prostatic hypertrophy)   . Palpitation     Holter in May 2014 with NSR, PVC, rare PACs    History   Social History  . Marital Status: Married    Spouse Name: N/A    Number of Children: N/A  . Years of Education: N/A   Occupational History  . Not on file.   Social History Main Topics  . Smoking status: Never Smoker   . Smokeless tobacco: Never Used  . Alcohol Use: Yes  . Drug Use: No  . Sexual Activity: Yes   Other Topics Concern  . Not on file   Social History Narrative  . No narrative on file    Past Surgical History  Procedure Laterality Date  . Vasectomy      Family History  Problem Relation Age of Onset  . Alcohol abuse Mother   . Hyperlipidemia Mother   . Hypertension Mother   . Hypertension Father     . Hyperlipidemia Father     Allergies  Allergen Reactions  . Penicillins   . Rosuvastatin     REACTION: myalgia    Current Outpatient Prescriptions on File Prior to Visit  Medication Sig Dispense Refill  . benazepril (LOTENSIN) 10 MG tablet TAKE 1/2 TABLET BY MOUTH DAILY  30 tablet  5  . finasteride (PROSCAR) 5 MG tablet TAKE 1 TABLET (5 MG TOTAL) BY MOUTH DAILY.  90 tablet  3  . propranolol (INDERAL) 10 MG tablet Take 1 tablet (10 mg total) by mouth 4 (four) times daily. As needed for fluttering  60 tablet  1  . valACYclovir (VALTREX) 500 MG tablet TAKE 1 TABLET EVERY DAY  30 tablet  10  . VYTORIN 10-20 MG per tablet TAKE 1 TABLET EVERY DAY  30 tablet  7   No current facility-administered medications on file prior to visit.    BP 94/62  Pulse 86  Temp(Src) 98.7 F (37.1 C) (Oral)  Wt 204 lb (92.534 kg)  SpO2 97%       Objective:   Physical Exam  Constitutional: He appears well-developed and well-nourished.  HENT:  Head: Normocephalic and atraumatic.  Eyes: Conjunctivae are normal. Pupils are equal, round, and reactive to light.  Neck: Normal range of motion. Neck supple.  Cardiovascular: Normal rate and regular rhythm.   Pulmonary/Chest: Effort normal and breath sounds normal.  Abdominal: Soft. Bowel sounds are normal.  Assessment & Plan:  Stable HTN  StableBPH  Lipid monitoring

## 2013-11-23 ENCOUNTER — Telehealth: Payer: Self-pay | Admitting: Internal Medicine

## 2013-11-23 NOTE — Telephone Encounter (Signed)
[facility] (p) [phone] (f) [phone] is request re-fills on the following: valACYclovir (VALTREX) 500 MG tablet finasteride (PROSCAR) 5 MG tablet

## 2013-11-25 MED ORDER — VALACYCLOVIR HCL 500 MG PO TABS: ORAL_TABLET | ORAL | Status: DC

## 2013-11-25 MED ORDER — FINASTERIDE 5 MG PO TABS: ORAL_TABLET | ORAL | Status: DC

## 2013-11-25 NOTE — Telephone Encounter (Signed)
Rx sent to pharmacy; noted for pt to call to schedule appt with new pcp.

## 2013-12-27 ENCOUNTER — Ambulatory Visit (INDEPENDENT_AMBULATORY_CARE_PROVIDER_SITE_OTHER): Payer: PRIVATE HEALTH INSURANCE | Admitting: Family Medicine

## 2013-12-27 ENCOUNTER — Encounter: Payer: Self-pay | Admitting: Family Medicine

## 2013-12-27 VITALS — BP 104/72 | HR 75 | Temp 98.2°F | Ht 69.0 in | Wt 204.5 lb

## 2013-12-27 DIAGNOSIS — G4730 Sleep apnea, unspecified: Secondary | ICD-10-CM

## 2013-12-27 DIAGNOSIS — Z23 Encounter for immunization: Secondary | ICD-10-CM

## 2013-12-27 DIAGNOSIS — Z125 Encounter for screening for malignant neoplasm of prostate: Secondary | ICD-10-CM

## 2013-12-27 DIAGNOSIS — I10 Essential (primary) hypertension: Secondary | ICD-10-CM

## 2013-12-27 DIAGNOSIS — G4710 Hypersomnia, unspecified: Secondary | ICD-10-CM

## 2013-12-27 DIAGNOSIS — N400 Enlarged prostate without lower urinary tract symptoms: Secondary | ICD-10-CM

## 2013-12-27 DIAGNOSIS — E785 Hyperlipidemia, unspecified: Secondary | ICD-10-CM

## 2013-12-27 MED ORDER — EZETIMIBE 10 MG PO TABS: 10.0000 mg | ORAL_TABLET | Freq: Every day | ORAL | Status: DC

## 2013-12-27 MED ORDER — SIMVASTATIN 20 MG PO TABS: 20.0000 mg | ORAL_TABLET | Freq: Every day | ORAL | Status: DC

## 2013-12-27 NOTE — Patient Instructions (Signed)
Price check the zetia + simvastatin, to equal the vytorin.  Recheck fasting labs in the near future, schedule a fasting lab appointment.  We'll contact you with your lab report. Take care.  Glad to see you.

## 2013-12-27 NOTE — Progress Notes (Signed)
Pre visit review using our clinic review tool, if applicable. No additional management support is needed unless otherwise documented below in the visit note.  Hypertension:    Using medication without problems or lightheadedness: yes Chest pain with exertion:no Edema:no Short of breath:no Due for labs.   Elevated Cholesterol: Using medications without problems:yes Muscle aches: no Diet compliance:yes Exercise:yes Pricing of vytorin an issue, d/w pt about checking zetia + simva, no change in meds overall but with separate dosing.    H/o BPH, will be due for repeat PSA later this year.  LUTS improved with meds.  No ADE on med  PMH and SH reviewed  Meds, vitals, and allergies reviewed.   ROS: See HPI.  Otherwise negative.    GEN: nad, alert and oriented HEENT: mucous membranes moist NECK: supple w/o LA CV: rrr. PULM: ctab, no inc wob ABD: soft, +bs EXT: no edema SKIN: no acute rash

## 2013-12-28 ENCOUNTER — Encounter: Payer: Self-pay | Admitting: Family Medicine

## 2013-12-28 NOTE — Assessment & Plan Note (Signed)
D/w pt, doing well with CPAP.

## 2013-12-28 NOTE — Assessment & Plan Note (Signed)
LUTS controlled, return for PSA, continue current med. D/w pt.

## 2013-12-28 NOTE — Assessment & Plan Note (Signed)
Change to separate rxs for zetia and simva.  Return for labs. D/w pt about diet and exercise.

## 2013-12-28 NOTE — Assessment & Plan Note (Signed)
Controlled, continue current meds.  D/w pt about ACE and LVH.  No ADE.  Return for labs.

## 2013-12-29 ENCOUNTER — Encounter: Payer: Self-pay | Admitting: Internal Medicine

## 2013-12-30 ENCOUNTER — Other Ambulatory Visit: Payer: Self-pay | Admitting: Internal Medicine

## 2014-01-03 ENCOUNTER — Other Ambulatory Visit: Payer: Self-pay | Admitting: Family Medicine

## 2014-01-03 NOTE — Telephone Encounter (Signed)
We were switching this to 2 separate meds- see med list.  Clarify with patient- if not patient generated, then deny.  Thanks.

## 2014-01-06 ENCOUNTER — Other Ambulatory Visit (INDEPENDENT_AMBULATORY_CARE_PROVIDER_SITE_OTHER): Payer: PRIVATE HEALTH INSURANCE

## 2014-01-06 DIAGNOSIS — E785 Hyperlipidemia, unspecified: Secondary | ICD-10-CM

## 2014-01-06 DIAGNOSIS — N400 Enlarged prostate without lower urinary tract symptoms: Secondary | ICD-10-CM

## 2014-01-06 DIAGNOSIS — I10 Essential (primary) hypertension: Secondary | ICD-10-CM

## 2014-01-06 LAB — COMPREHENSIVE METABOLIC PANEL
ALT: 22 U/L (ref 0–53)
AST: 20 U/L (ref 0–37)
Albumin: 3.7 g/dL (ref 3.5–5.2)
Alkaline Phosphatase: 74 U/L (ref 39–117)
BUN: 16 mg/dL (ref 6–23)
CO2: 26 mEq/L (ref 19–32)
Calcium: 8.9 mg/dL (ref 8.4–10.5)
Chloride: 106 mEq/L (ref 96–112)
Creatinine, Ser: 1.2 mg/dL (ref 0.4–1.5)
GFR: 70.37 mL/min (ref >60.00)
Glucose, Bld: 83 mg/dL (ref 70–99)
Potassium: 4.4 mEq/L (ref 3.5–5.1)
Sodium: 139 mEq/L (ref 135–145)
Total Bilirubin: 0.8 mg/dL (ref 0.2–1.2)
Total Protein: 6.9 g/dL (ref 6.0–8.3)

## 2014-01-06 LAB — LIPID PANEL
Cholesterol: 142 mg/dL (ref 0–200)
HDL: 30.6 mg/dL — ABNORMAL LOW (ref >39.00)
LDL Cholesterol: 77 mg/dL (ref 0–99)
NonHDL: 111.4
Total CHOL/HDL Ratio: 5
Triglycerides: 171 mg/dL — ABNORMAL HIGH (ref 0.0–149.0)
VLDL: 34.2 mg/dL (ref 0.0–40.0)

## 2014-01-06 LAB — PSA: PSA: 0.17 ng/mL (ref 0.10–4.00)

## 2014-03-22 ENCOUNTER — Encounter: Payer: Self-pay | Admitting: Family Medicine

## 2014-03-22 ENCOUNTER — Ambulatory Visit (INDEPENDENT_AMBULATORY_CARE_PROVIDER_SITE_OTHER): Payer: PRIVATE HEALTH INSURANCE | Admitting: Family Medicine

## 2014-03-22 VITALS — BP 100/72 | HR 76 | Temp 98.5°F | Ht 69.0 in | Wt 210.5 lb

## 2014-03-22 DIAGNOSIS — J069 Acute upper respiratory infection, unspecified: Secondary | ICD-10-CM

## 2014-03-22 MED ORDER — AZITHROMYCIN 250 MG PO TABS: ORAL_TABLET | ORAL | Status: DC

## 2014-03-22 NOTE — Progress Notes (Signed)
Pre visit review using our clinic review tool, if applicable. No additional management support is needed unless otherwise documented below in the visit note. 

## 2014-03-22 NOTE — Progress Notes (Signed)
Dr. Frederico Hamman T. Sherell Christoffel, MD, [facility] [place], [address] Phone: [phone] Fax: [phone]  03/22/2014  Patient: Joseph Drake, MRN: [id number], DOB: [birth year], 53 y.o.  Primary Physician:  Elsie Stain, MD  Chief Complaint: Nasal Congestion and Cough  Subjective:   Joseph Drake is a 53 y.o. very pleasant male patient who presents with the following:  A lot of nasal congestion, blood streaked mucouus with blowing nose. Some chest congestion. Throat is a little irritaed. About a week, and yest was the worst. Generally, he is not feeling very well.  He is also having some sinus congestion and a mild pain.  No earache.  No fever.   Past Medical History, Surgical History, Social History, Family History, Problem List, Medications, and Allergies have been reviewed and updated if relevant.  ROS: GEN: Acute illness details above GI: Tolerating PO intake GU: maintaining adequate hydration and urination Pulm: No SOB Interactive and getting along well at home.  Otherwise, ROS is as per the HPI.   Objective:   BP 100/72 mmHg  Pulse 76  Temp(Src) 98.5 F (36.9 C) (Oral)  Ht 5\' 9"  (1.753 m)  Wt 210 lb 8 oz (95.482 kg)  BMI 31.07 kg/m2   Gen: WDWN, NAD; A & O x3, cooperative. Pleasant.Globally Non-toxic HEENT: Normocephalic and atraumatic. Throat clear, w/o exudate, R TM clear, L TM - good landmarks, No fluid present. rhinnorhea.  MMM Frontal sinuses: NT Max sinuses: NT NECK: Anterior cervical  LAD is absent CV: RRR, No M/G/R, cap refill <2 sec PULM: Breathing comfortably in no respiratory distress. no wheezing, crackles, rhonchi EXT: No c/c/e PSYCH: Friendly, good eye contact MSK: Nml gait     Laboratory and Imaging Data:  Assessment and Plan:   URI (upper respiratory infection)  Most likely viral syndrome.  I reassured the patient, and recommended basic over-the-counter cold remedies, sleep,  and fluids.  Given the holiday weekend, I am going to give him a paper prescription for antibiotics to hold onto in case his symptoms worsen and his sinus pain worsens.  Follow-up: No Follow-up on file.  New Prescriptions   AZITHROMYCIN (ZITHROMAX) 250 MG TABLET    2 tabs po on day 1, then 1 tab po for 4 days   No orders of the defined types were placed in this encounter.    Signed,  Maud Deed. Kemper Heupel, MD   Patient's Medications  New Prescriptions   AZITHROMYCIN (ZITHROMAX) 250 MG TABLET    2 tabs po on day 1, then 1 tab po for 4 days  Previous Medications   BENAZEPRIL (LOTENSIN) 10 MG TABLET    TAKE 1/2 TABLET BY MOUTH DAILY   EZETIMIBE (ZETIA) 10 MG TABLET    Take 1 tablet (10 mg total) by mouth daily.   FINASTERIDE (PROSCAR) 5 MG TABLET    TAKE 1 TABLET (5 MG TOTAL) BY MOUTH DAILY.   PROPRANOLOL (INDERAL) 10 MG TABLET    Take 1 tablet (10 mg total) by mouth 4 (four) times daily. As needed for fluttering   SIMVASTATIN (ZOCOR) 20 MG TABLET    Take 1 tablet (20 mg total) by mouth at bedtime.   VALACYCLOVIR (VALTREX) 500 MG TABLET    TAKE 1 TABLET EVERY DAY   VYTORIN 10-20 MG PER TABLET    TAKE 1 TABLET EVERY DAY  Modified Medications   No medications on file  Discontinued Medications   No medications on file

## 2014-04-12 ENCOUNTER — Other Ambulatory Visit: Payer: Self-pay | Admitting: Family Medicine

## 2014-04-12 NOTE — Telephone Encounter (Signed)
Received refill request electronically from pharmacy. Last refill 11/25/13 #30/3, last office visit 03/22/14/acute. Is it okay to refill medication?

## 2014-04-13 NOTE — Telephone Encounter (Signed)
Sent. Thanks.   

## 2014-05-24 ENCOUNTER — Encounter: Payer: Self-pay | Admitting: Gastroenterology

## 2014-05-25 ENCOUNTER — Ambulatory Visit (INDEPENDENT_AMBULATORY_CARE_PROVIDER_SITE_OTHER)
Admission: RE | Admit: 2014-05-25 | Discharge: 2014-05-25 | Disposition: A | Discharge status: Home / Self Care | Payer: PRIVATE HEALTH INSURANCE | Source: Ambulatory Visit | Attending: Internal Medicine | Admitting: Internal Medicine

## 2014-05-25 ENCOUNTER — Encounter: Payer: Self-pay | Admitting: Internal Medicine

## 2014-05-25 ENCOUNTER — Ambulatory Visit (INDEPENDENT_AMBULATORY_CARE_PROVIDER_SITE_OTHER): Payer: PRIVATE HEALTH INSURANCE | Admitting: Internal Medicine

## 2014-05-25 VITALS — BP 110/70 | HR 70 | Temp 98.1°F | Wt 208.0 lb

## 2014-05-25 DIAGNOSIS — M542 Cervicalgia: Secondary | ICD-10-CM

## 2014-05-25 DIAGNOSIS — S161XXA Strain of muscle, fascia and tendon at neck level, initial encounter: Secondary | ICD-10-CM | POA: Diagnosis not present

## 2014-05-25 IMAGING — CR DG CERVICAL SPINE COMPLETE 4+V
5 series · 5 of 5 positions shown · non-contrast
Comparison: None.

CLINICAL DATA: Neck pain post fall yesterday.

EXAM:
CERVICAL SPINE  4+ VIEWS

[view not recorded (1 of 5)]
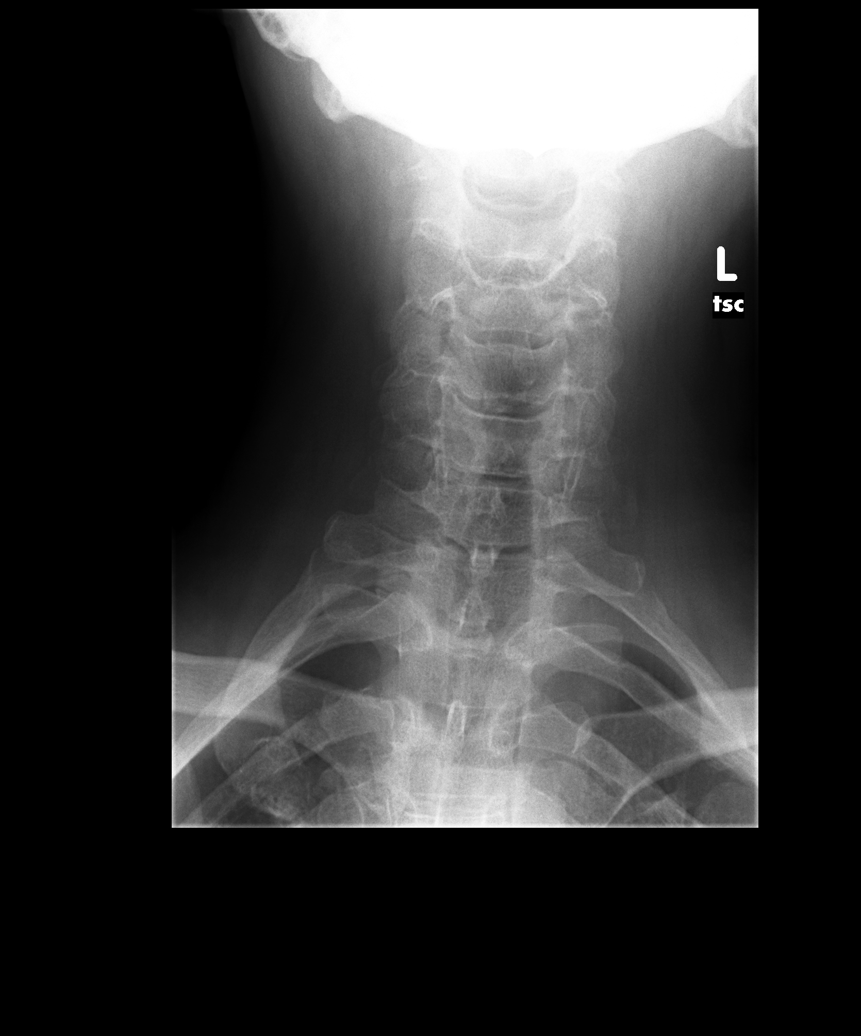

[view not recorded (2 of 5)]
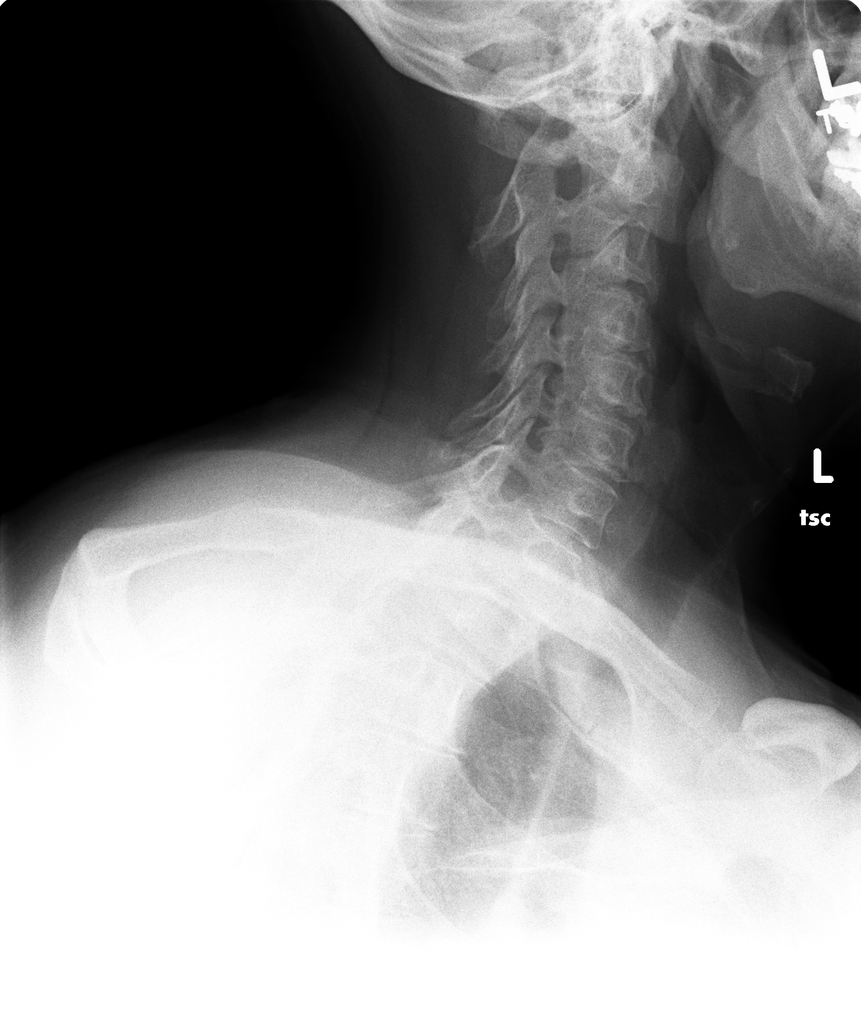

[view not recorded (3 of 5)]
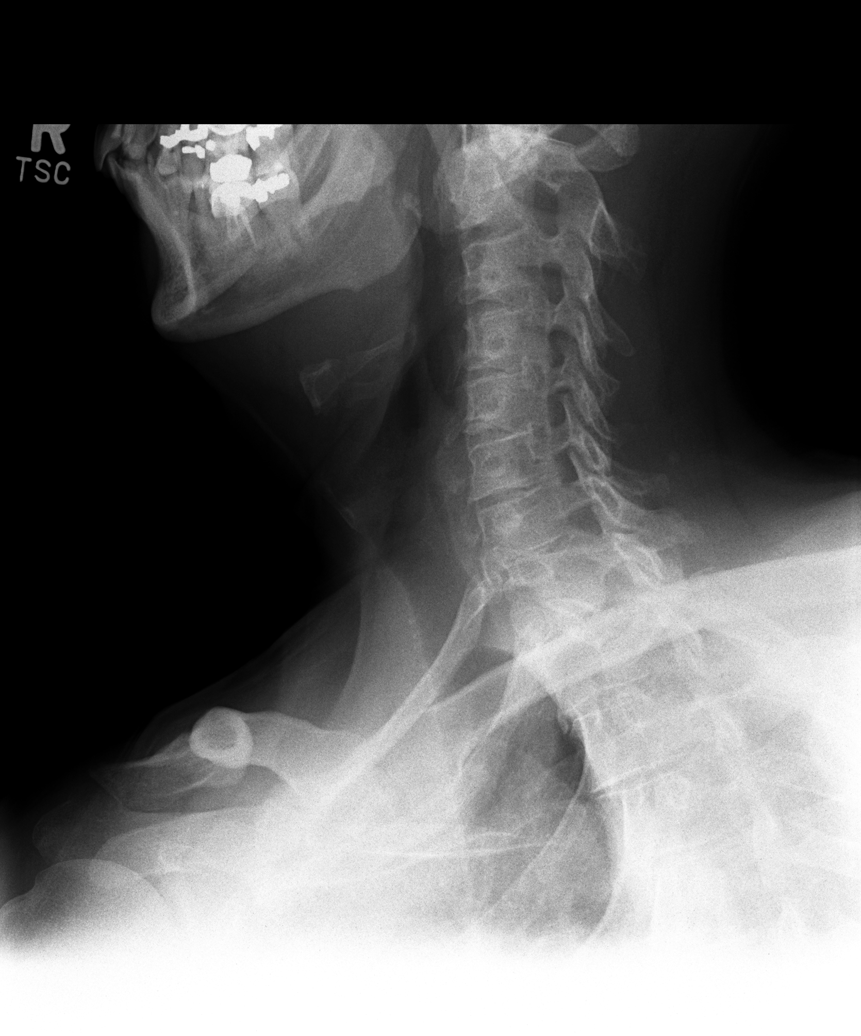

[view not recorded (4 of 5)]
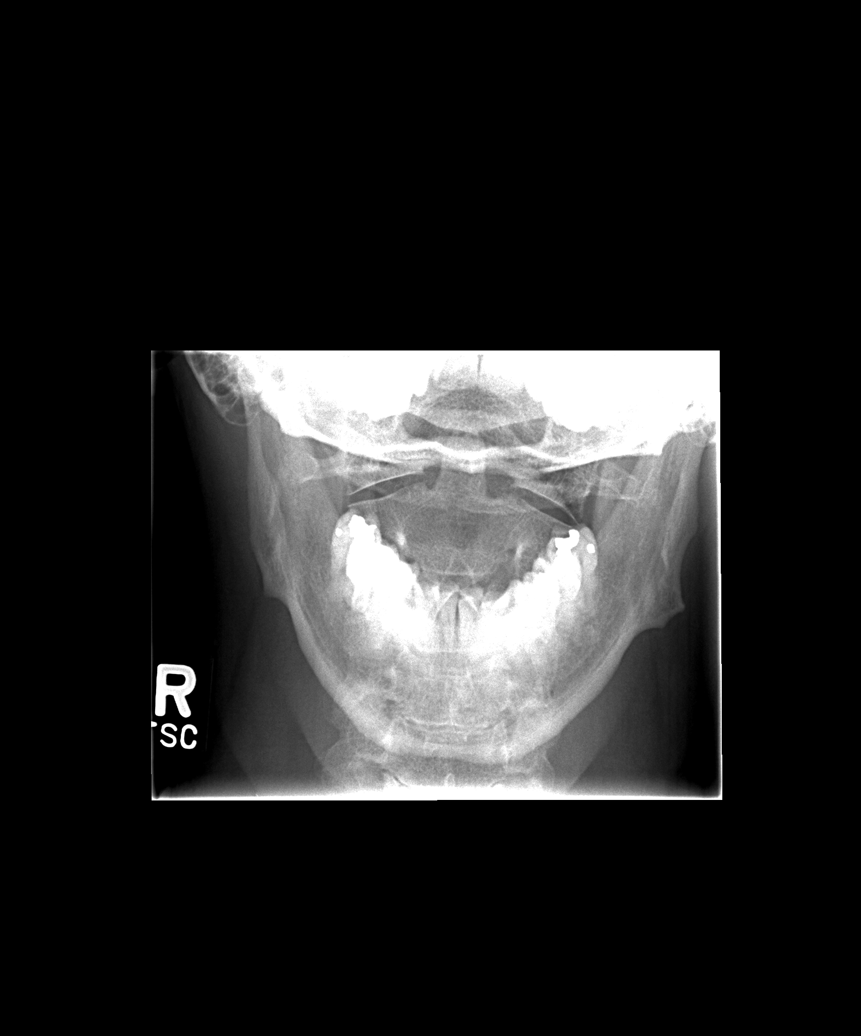

[view not recorded (5 of 5)]
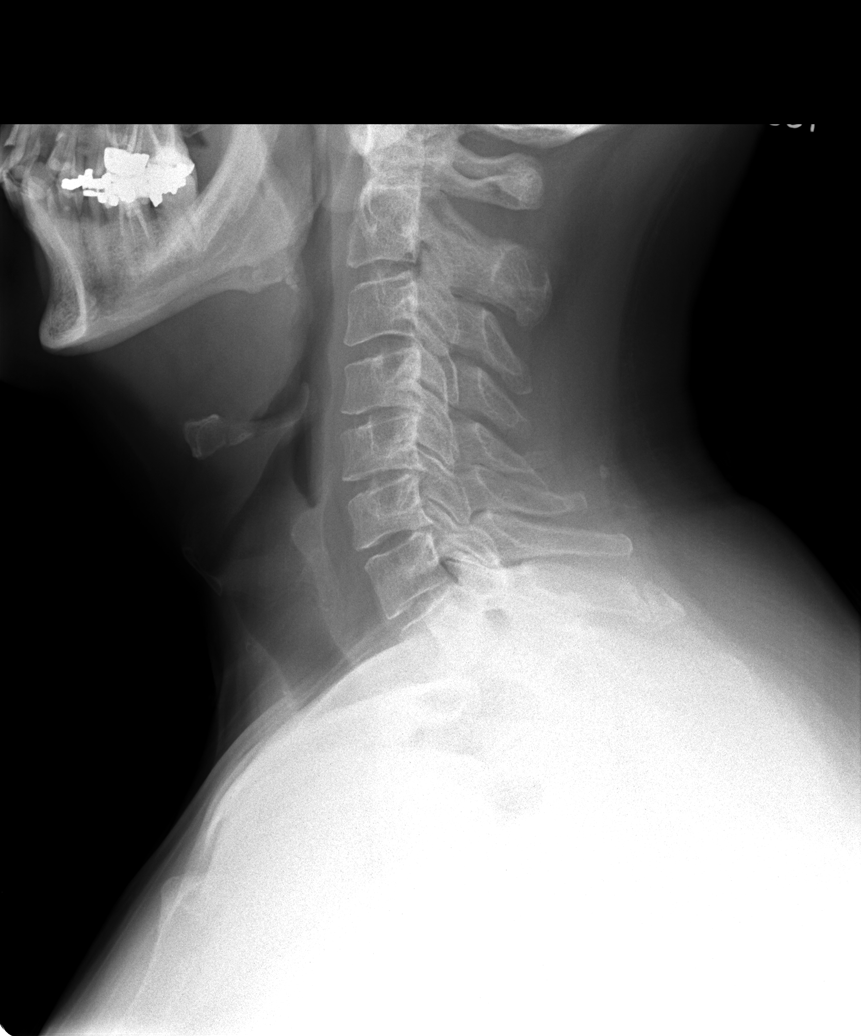

[5 of 5 positions shown; findings below may reference images not displayed]

FINDINGS: Vertebral body alignment and heights are normal. There is minimal
spondylosis. Prevertebral soft tissues as well as the atlantoaxial
articulation are normal. There is subtle disc space narrowing at the
C4-5, C5-6 and C6-7 levels. There is no acute fracture or
subluxation. There is mild uncovertebral joint spurring. There is
mild left-sided neural foraminal narrowing at the C4-5 level. There
is severe right-sided neural foraminal narrowing at the C4-5 level
to a lesser extent at the C5-6 and C6-7 levels.
IMPRESSION: No acute findings.

Mild spondylosis with minimal multilevel disc disease. Significant
neural foramen narrowing bilaterally as described which is worse on
the right-side at the C4-5 level.

## 2014-05-25 NOTE — Progress Notes (Signed)
H&P:  Mr. Joseph Drake is a 54 y.o.new gym member who presents today due to a neck injury attempting to do a vertical handstand pushup last night. While in the handstand position, he fell and his chin went towards his chest. As neck bent, he heard about 4-5 pops in neck. Sore as soon as it happened but as time went on it grew more uncomfortable. Applied heat when got home and took some ibuprofen 600 mg. Woke up around 4:30 this am - couldn't lift head up and had to do a sit up to get head up - took some more ibuprofen which made it a little more manageable to go back to bed for a few hours. Pain feels like a constant dull ache - if he moves a certain way it's more sensitive. Turning head quickly causes abrupt pain. No shooting/shock pains, dizziness, nausea, numbness or tingling, or SOB. No visual disturbances noted.  Review of Systems   Past Medical History  Diagnosis Date  . Hyperlipidemia   . Allergy   . BPH (benign prostatic hypertrophy)   . Palpitation     Holter in May 2014 with NSR, PVC, rare PACs  . History of continuous positive airway pressure (CPAP) therapy     Family History  Problem Relation Age of Onset  . Alcohol abuse Mother   . Hyperlipidemia Mother   . Hypertension Mother   . Hypertension Father   . Hyperlipidemia Father   . Colon cancer Paternal Uncle   . Prostate cancer Neg Hx     History   Social History  . Marital Status: Married    Spouse Name: N/A    Number of Children: N/A  . Years of Education: N/A   Occupational History  . Not on file.   Social History Main Topics  . Smoking status: Never Smoker   . Smokeless tobacco: Never Used  . Alcohol Use: 0.0 oz/week    0 Not specified per week     Comment: rarely   . Drug Use: No  . Sexual Activity: Yes   Other Topics Concern  . Not on file   Social History Narrative   Drafting, structural Chief Strategy Officer for bridges   Remarried 2002   1 daughter from 1st marriage   2 stepkids from wife's first  marriage   [employer] fan    Allergies  Allergen Reactions  . Penicillins     rash  . Rosuvastatin     REACTION: myalgia    Constitutional: Denies headache and fatigue. HEENT: Denies visual disturbances, facial pain or ear pain Respiratory: Denies difficulty breathing or shortness of breath.  Cardiovascular: Denies chest pain, chest tightness or palpitations GI: Denies nausea or vomiting MS: Positive pain in neck; Denies swelling Neuro: Denies dizziness, numbness/tingling, shocks or change in mental status  No other specific complaints in a complete review of systems (except as listed in HPI above).  Objective:   BP 110/70 mmHg  Pulse 70  Temp(Src) 98.1 F (36.7 C) (Oral)  Wt 208 lb (94.348 kg)  SpO2 98% Wt Readings from Last 3 Encounters:  05/25/14 208 lb (94.348 kg)  03/22/14 210 lb 8 oz (95.482 kg)  12/27/13 204 lb 8 oz (92.761 kg)     General: Appears his stated age, well developed, well nourished in NAD. HEENT: Head: normal shape and size; Eyes: sclera white, no icterus, conjunctiva pink, PERRLA and EOMs intact; Ears: Tm's gray and intact, normal light reflex; Nose: mucosa pink and moist, septum midline; Throat/Mouth: +  PND. Teeth present, mucosa erythematous and moist, no exudate noted, no lesions or ulcerations noted.  Neck: Neck supple, trachea midline. No masses, lumps or thyromegaly present.  Cardiovascular: Normal rate and rhythm. S1,S2 noted.  No murmur, rubs or gallops noted.  Pulmonary/Chest: Normal effort and positive vesicular breath sounds. No respiratory distress.  Musculoskeletal: Mild cervical vertebrae tenderness. ROM: limited neck flexion, limited neck extension, full lateral neck flexion and limited rotation to the right, no crepitus in passive or active motion; trapezius muscle tender to palpation b/l. Neuro: Alert & oriented x 3. CN 2-12 intact. Speech is clear and fluent. Reflexes 2+ and symmetric. Light touch, position sense, and vibration sense are  intact in fingers and toes.  Assessment & Plan:   Cervical strain:  Spinal X-ray today to r/o fracture Ibuprofen 600-800 mg PRN Heating pad as needed Neck exercises given If pain persist, consider PT  RTC as needed or if symptoms persist.

## 2014-05-25 NOTE — Patient Instructions (Signed)
Cervical Sprain A cervical sprain is an injury in the neck in which the strong, fibrous tissues (ligaments) that connect your neck bones stretch or tear. Cervical sprains can range from mild to severe. Severe cervical sprains can cause the neck vertebrae to be unstable. This can lead to damage of the spinal cord and can result in serious nervous system problems. The amount of time it takes for a cervical sprain to get better depends on the cause and extent of the injury. Most cervical sprains heal in 1 to 3 weeks. CAUSES  Severe cervical sprains may be caused by:   Contact sport injuries (such as from football, rugby, wrestling, hockey, auto racing, gymnastics, diving, martial arts, or boxing).   Motor vehicle collisions.   Whiplash injuries. This is an injury from a sudden forward and backward whipping movement of the head and neck.  Falls.  Mild cervical sprains may be caused by:   Being in an awkward position, such as while cradling a telephone between your ear and shoulder.   Sitting in a chair that does not offer proper support.   Working at a poorly Landscape architect station.   Looking up or down for long periods of time.  SYMPTOMS   Pain, soreness, stiffness, or a burning sensation in the front, back, or sides of the neck. This discomfort may develop immediately after the injury or slowly, 24 hours or more after the injury.   Pain or tenderness directly in the middle of the back of the neck.   Shoulder or upper back pain.   Limited ability to move the neck.   Headache.   Dizziness.   Weakness, numbness, or tingling in the hands or arms.   Muscle spasms.   Difficulty swallowing or chewing.   Tenderness and swelling of the neck.  DIAGNOSIS  Most of the time your health care provider can diagnose a cervical sprain by taking your history and doing a physical exam. Your health care provider will ask about previous neck injuries and any known neck  problems, such as arthritis in the neck. X-rays may be taken to find out if there are any other problems, such as with the bones of the neck. Other tests, such as a CT scan or MRI, may also be needed.  TREATMENT  Treatment depends on the severity of the cervical sprain. Mild sprains can be treated with rest, keeping the neck in place (immobilization), and pain medicines. Severe cervical sprains are immediately immobilized. Further treatment is done to help with pain, muscle spasms, and other symptoms and may include:  Medicines, such as pain relievers, numbing medicines, or muscle relaxants.   Physical therapy. This may involve stretching exercises, strengthening exercises, and posture training. Exercises and improved posture can help stabilize the neck, strengthen muscles, and help stop symptoms from returning.  HOME CARE INSTRUCTIONS   Put ice on the injured area.   Put ice in a plastic bag.   Place a towel between your skin and the bag.   Leave the ice on for 15-20 minutes, 3-4 times a day.   If your injury was severe, you may have been given a cervical collar to wear. A cervical collar is a two-piece collar designed to keep your neck from moving while it heals.  Do not remove the collar unless instructed by your health care provider.  If you have long hair, keep it outside of the collar.  Ask your health care provider before making any adjustments to your collar. Minor  adjustments may be required over time to improve comfort and reduce pressure on your chin or on the back of your head.  Ifyou are allowed to remove the collar for cleaning or bathing, follow your health care provider's instructions on how to do so safely.  Keep your collar clean by wiping it with mild soap and water and drying it completely. If the collar you have been given includes removable pads, remove them every 1-2 days and hand wash them with soap and water. Allow them to air dry. They should be completely  dry before you wear them in the collar.  If you are allowed to remove the collar for cleaning and bathing, wash and dry the skin of your neck. Check your skin for irritation or sores. If you see any, tell your health care provider.  Do not drive while wearing the collar.   Only take over-the-counter or prescription medicines for pain, discomfort, or fever as directed by your health care provider.   Keep all follow-up appointments as directed by your health care provider.   Keep all physical therapy appointments as directed by your health care provider.   Make any needed adjustments to your workstation to promote good posture.   Avoid positions and activities that make your symptoms worse.   Warm up and stretch before being active to help prevent problems.  SEEK MEDICAL CARE IF:   Your pain is not controlled with medicine.   You are unable to decrease your pain medicine over time as planned.   Your activity level is not improving as expected.  SEEK IMMEDIATE MEDICAL CARE IF:   You develop any bleeding.  You develop stomach upset.  You have signs of an allergic reaction to your medicine.   Your symptoms get worse.   You develop new, unexplained symptoms.   You have numbness, tingling, weakness, or paralysis in any part of your body.  MAKE SURE YOU:   Understand these instructions.  Will watch your condition.  Will get help right away if you are not doing well or get worse. Document Released: 02/09/2007 Document Revised: 04/19/2013 Document Reviewed: 10/20/2012 [facility] Patient Information 2015 [place], [place]. This information is not intended to replace advice given to you by your health care provider. Make sure you discuss any questions you have with your health care provider. Cervical Sprain A cervical sprain is an injury in the neck in which the strong, fibrous tissues (ligaments) that connect your neck bones stretch or tear. Cervical sprains can range  from mild to severe. Severe cervical sprains can cause the neck vertebrae to be unstable. This can lead to damage of the spinal cord and can result in serious nervous system problems. The amount of time it takes for a cervical sprain to get better depends on the cause and extent of the injury. Most cervical sprains heal in 1 to 3 weeks. CAUSES  Severe cervical sprains may be caused by:   Contact sport injuries (such as from football, rugby, wrestling, hockey, auto racing, gymnastics, diving, martial arts, or boxing).   Motor vehicle collisions.   Whiplash injuries. This is an injury from a sudden forward and backward whipping movement of the head and neck.  Falls.  Mild cervical sprains may be caused by:   Being in an awkward position, such as while cradling a telephone between your ear and shoulder.   Sitting in a chair that does not offer proper support.   Working at a poorly Landscape architect station.  Looking up or down for long periods of time.  SYMPTOMS   Pain, soreness, stiffness, or a burning sensation in the front, back, or sides of the neck. This discomfort may develop immediately after the injury or slowly, 24 hours or more after the injury.   Pain or tenderness directly in the middle of the back of the neck.   Shoulder or upper back pain.   Limited ability to move the neck.   Headache.   Dizziness.   Weakness, numbness, or tingling in the hands or arms.   Muscle spasms.   Difficulty swallowing or chewing.   Tenderness and swelling of the neck.  DIAGNOSIS  Most of the time your health care provider can diagnose a cervical sprain by taking your history and doing a physical exam. Your health care provider will ask about previous neck injuries and any known neck problems, such as arthritis in the neck. X-rays may be taken to find out if there are any other problems, such as with the bones of the neck. Other tests, such as a CT scan or MRI, may also  be needed.  TREATMENT  Treatment depends on the severity of the cervical sprain. Mild sprains can be treated with rest, keeping the neck in place (immobilization), and pain medicines. Severe cervical sprains are immediately immobilized. Further treatment is done to help with pain, muscle spasms, and other symptoms and may include:  Medicines, such as pain relievers, numbing medicines, or muscle relaxants.   Physical therapy. This may involve stretching exercises, strengthening exercises, and posture training. Exercises and improved posture can help stabilize the neck, strengthen muscles, and help stop symptoms from returning.  HOME CARE INSTRUCTIONS   Put ice on the injured area.   Put ice in a plastic bag.   Place a towel between your skin and the bag.   Leave the ice on for 15-20 minutes, 3-4 times a day.   If your injury was severe, you may have been given a cervical collar to wear. A cervical collar is a two-piece collar designed to keep your neck from moving while it heals.  Do not remove the collar unless instructed by your health care provider.  If you have long hair, keep it outside of the collar.  Ask your health care provider before making any adjustments to your collar. Minor adjustments may be required over time to improve comfort and reduce pressure on your chin or on the back of your head.  Ifyou are allowed to remove the collar for cleaning or bathing, follow your health care provider's instructions on how to do so safely.  Keep your collar clean by wiping it with mild soap and water and drying it completely. If the collar you have been given includes removable pads, remove them every 1-2 days and hand wash them with soap and water. Allow them to air dry. They should be completely dry before you wear them in the collar.  If you are allowed to remove the collar for cleaning and bathing, wash and dry the skin of your neck. Check your skin for irritation or sores. If  you see any, tell your health care provider.  Do not drive while wearing the collar.   Only take over-the-counter or prescription medicines for pain, discomfort, or fever as directed by your health care provider.   Keep all follow-up appointments as directed by your health care provider.   Keep all physical therapy appointments as directed by your health care provider.   Make  any needed adjustments to your workstation to promote good posture.   Avoid positions and activities that make your symptoms worse.   Warm up and stretch before being active to help prevent problems.  SEEK MEDICAL CARE IF:   Your pain is not controlled with medicine.   You are unable to decrease your pain medicine over time as planned.   Your activity level is not improving as expected.  SEEK IMMEDIATE MEDICAL CARE IF:   You develop any bleeding.  You develop stomach upset.  You have signs of an allergic reaction to your medicine.   Your symptoms get worse.   You develop new, unexplained symptoms.   You have numbness, tingling, weakness, or paralysis in any part of your body.  MAKE SURE YOU:   Understand these instructions.  Will watch your condition.  Will get help right away if you are not doing well or get worse. Document Released: 02/09/2007 Document Revised: 04/19/2013 Document Reviewed: 10/20/2012 [facility] Patient Information 2015 [place], [place]. This information is not intended to replace advice given to you by your health care provider. Make sure you discuss any questions you have with your health care provider.

## 2014-05-25 NOTE — Progress Notes (Signed)
Pre visit review using our clinic review tool, if applicable. No additional management support is needed unless otherwise documented below in the visit note. 

## 2014-06-20 ENCOUNTER — Encounter: Payer: Self-pay | Admitting: Family Medicine

## 2014-06-20 ENCOUNTER — Other Ambulatory Visit: Payer: Self-pay | Admitting: *Deleted

## 2014-06-21 ENCOUNTER — Other Ambulatory Visit: Payer: Self-pay | Admitting: Family Medicine

## 2014-06-21 MED ORDER — VALACYCLOVIR HCL 500 MG PO TABS: 500.0000 mg | ORAL_TABLET | Freq: Every day | ORAL | Status: DC

## 2014-06-21 MED ORDER — EZETIMIBE-SIMVASTATIN 10-20 MG PO TABS: 1.0000 | ORAL_TABLET | Freq: Every day | ORAL | Status: DC

## 2014-06-21 MED ORDER — BENAZEPRIL HCL 10 MG PO TABS: 5.0000 mg | ORAL_TABLET | Freq: Every day | ORAL | Status: DC

## 2014-06-21 MED ORDER — FINASTERIDE 5 MG PO TABS
ORAL_TABLET | ORAL | Status: DC
Start: 2014-06-21 — End: 2014-06-21

## 2014-06-21 MED ORDER — FINASTERIDE 5 MG PO TABS: ORAL_TABLET | ORAL | Status: DC

## 2014-06-21 NOTE — Telephone Encounter (Signed)
Just received refill request from pharmacy and it has already been sent to pharmacy. Do you want to refill other medications until his physical is due?

## 2014-06-21 NOTE — Telephone Encounter (Signed)
Sent. Thanks.   

## 2014-07-15 ENCOUNTER — Emergency Department (HOSPITAL_COMMUNITY): Payer: PRIVATE HEALTH INSURANCE

## 2014-07-15 ENCOUNTER — Encounter (HOSPITAL_COMMUNITY): Payer: Self-pay | Admitting: Emergency Medicine

## 2014-07-15 ENCOUNTER — Emergency Department (HOSPITAL_COMMUNITY)
Admission: EM | Admit: 2014-07-15 | Discharge: 2014-07-15 | Disposition: A | Discharge status: Home / Self Care | Payer: PRIVATE HEALTH INSURANCE | Attending: Emergency Medicine | Admitting: Emergency Medicine

## 2014-07-15 DIAGNOSIS — N200 Calculus of kidney: Secondary | ICD-10-CM | POA: Diagnosis not present

## 2014-07-15 DIAGNOSIS — Z79899 Other long term (current) drug therapy: Secondary | ICD-10-CM | POA: Diagnosis not present

## 2014-07-15 DIAGNOSIS — N400 Enlarged prostate without lower urinary tract symptoms: Secondary | ICD-10-CM | POA: Diagnosis not present

## 2014-07-15 DIAGNOSIS — Z880 Allergy status to penicillin: Secondary | ICD-10-CM | POA: Insufficient documentation

## 2014-07-15 DIAGNOSIS — Z9981 Dependence on supplemental oxygen: Secondary | ICD-10-CM | POA: Diagnosis not present

## 2014-07-15 DIAGNOSIS — E785 Hyperlipidemia, unspecified: Secondary | ICD-10-CM | POA: Diagnosis not present

## 2014-07-15 DIAGNOSIS — R1032 Left lower quadrant pain: Secondary | ICD-10-CM | POA: Diagnosis present

## 2014-07-15 LAB — CBC WITH DIFFERENTIAL/PLATELET
Basophils Absolute: 0 10*3/uL (ref 0.0–0.1)
Basophils Relative: 0 % (ref 0–1)
Eosinophils Absolute: 0.1 10*3/uL (ref 0.0–0.7)
Eosinophils Relative: 1 % (ref 0–5)
HCT: 44.4 % (ref 39.0–52.0)
Hemoglobin: 15.4 g/dL (ref 13.0–17.0)
Lymphocytes Relative: 16 % (ref 12–46)
Lymphs Abs: 1.6 10*3/uL (ref 0.7–4.0)
MCH: 30.7 pg (ref 26.0–34.0)
MCHC: 34.7 g/dL (ref 30.0–36.0)
MCV: 88.4 fL (ref 78.0–100.0)
Monocytes Absolute: 0.6 10*3/uL (ref 0.1–1.0)
Monocytes Relative: 7 % (ref 3–12)
Neutro Abs: 7.3 10*3/uL (ref 1.7–7.7)
Neutrophils Relative %: 76 % (ref 43–77)
Platelets: 217 10*3/uL (ref 150–400)
RBC: 5.02 MIL/uL (ref 4.22–5.81)
RDW: 12.8 % (ref 11.5–15.5)
WBC: 9.6 10*3/uL (ref 4.0–10.5)

## 2014-07-15 LAB — URINALYSIS, ROUTINE W REFLEX MICROSCOPIC
Bilirubin Urine: NEGATIVE
Glucose, UA: NEGATIVE mg/dL
Ketones, ur: 40 mg/dL — AB
Leukocytes, UA: NEGATIVE
Nitrite: NEGATIVE
Protein, ur: 30 mg/dL — AB
Specific Gravity, Urine: 1.023 (ref 1.005–1.030)
Urobilinogen, UA: 0.2 mg/dL (ref 0.0–1.0)
pH: 6 (ref 5.0–8.0)

## 2014-07-15 LAB — COMPREHENSIVE METABOLIC PANEL
ALT: 30 U/L (ref 0–53)
AST: 44 U/L — ABNORMAL HIGH (ref 0–37)
Albumin: 4.3 g/dL (ref 3.5–5.2)
Alkaline Phosphatase: 92 U/L (ref 39–117)
Anion gap: 12 (ref 5–15)
BUN: 25 mg/dL — ABNORMAL HIGH (ref 6–23)
CO2: 19 mmol/L (ref 19–32)
Calcium: 9 mg/dL (ref 8.4–10.5)
Chloride: 104 mmol/L (ref 96–112)
Creatinine, Ser: 1.38 mg/dL — ABNORMAL HIGH (ref 0.50–1.35)
GFR calc Af Amer: 66 mL/min — ABNORMAL LOW (ref >90)
GFR calc non Af Amer: 57 mL/min — ABNORMAL LOW (ref >90)
Glucose, Bld: 122 mg/dL — ABNORMAL HIGH (ref 70–99)
Potassium: 4.2 mmol/L (ref 3.5–5.1)
Sodium: 135 mmol/L (ref 135–145)
Total Bilirubin: 1 mg/dL (ref 0.3–1.2)
Total Protein: 7.4 g/dL (ref 6.0–8.3)

## 2014-07-15 LAB — LIPASE, BLOOD: Lipase: 42 U/L (ref 11–59)

## 2014-07-15 LAB — URINE MICROSCOPIC-ADD ON

## 2014-07-15 LAB — AMYLASE: Amylase: 88 U/L (ref 0–105)

## 2014-07-15 IMAGING — CT CT RENAL STONE PROTOCOL
2 of 4 series · 16 of 46 positions shown, 18 images · non-contrast
Comparison: None.

CLINICAL DATA: LT Lower Abdominal Pain radiating across lower
abdomen and LT back with discomfort in backPressure in bowels Nausea
No hx of stones Unknown hematuria No hx of CA

EXAM:
CT ABDOMEN AND PELVIS WITHOUT CONTRAST
TECHNIQUE: Multidetector CT imaging of the abdomen and pelvis was performed
following the standard protocol without IV contrast.

[Series 2: stone study 5.0 i30f 1 · axial · 0.91mm/px · z∈[+253,+673]mm · 13 of 94 slices shown, 15 images]
[im 5/94  soft-tissue]
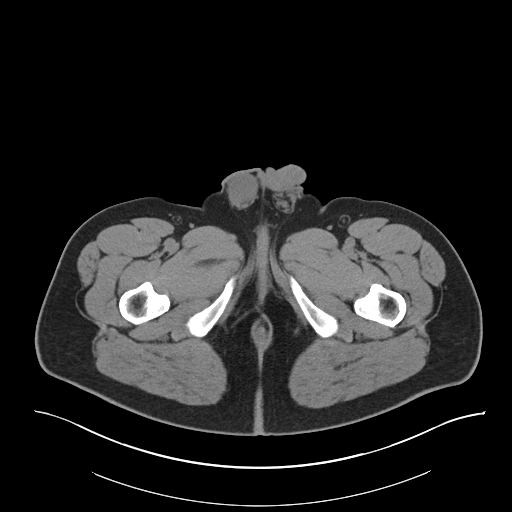
[im 5/94  bone]
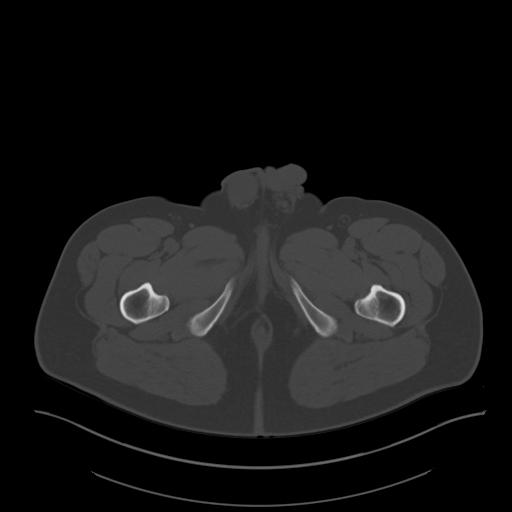
[im 13/94  soft-tissue]
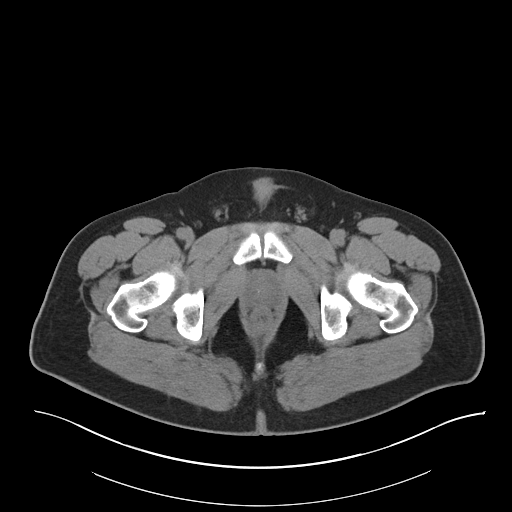
[im 21/94  soft-tissue]
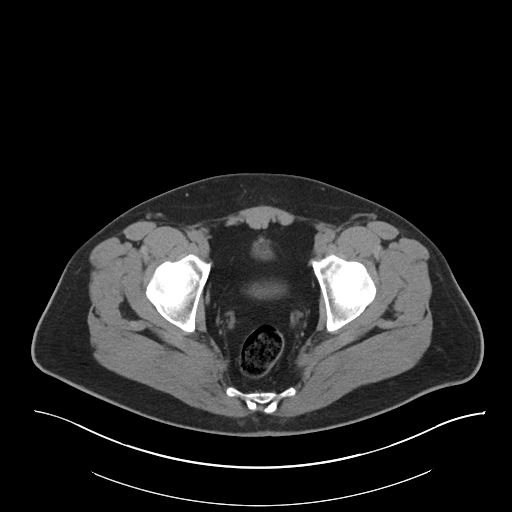
[im 25/94  soft-tissue]
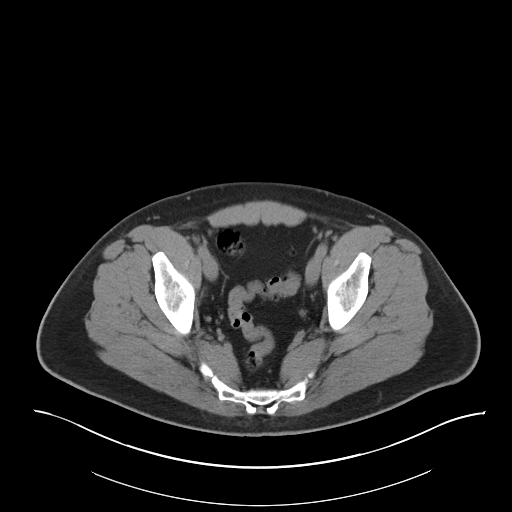
[im 33/94  soft-tissue]
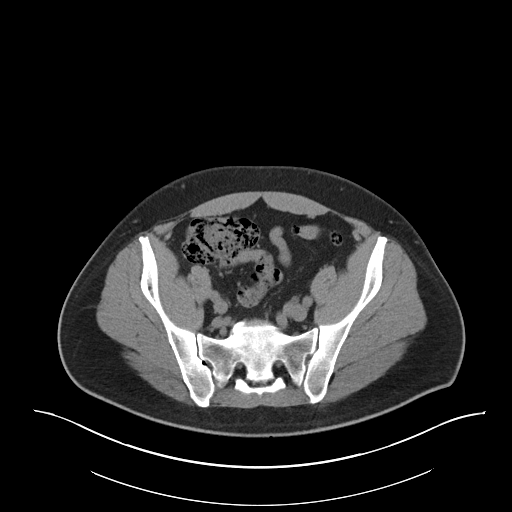
[im 41/94  soft-tissue]
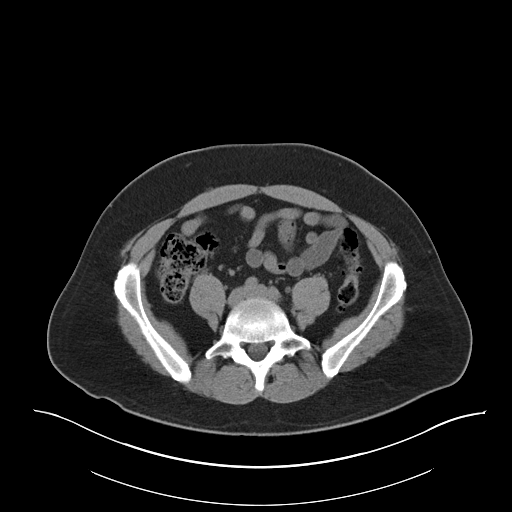
[im 49/94  soft-tissue]
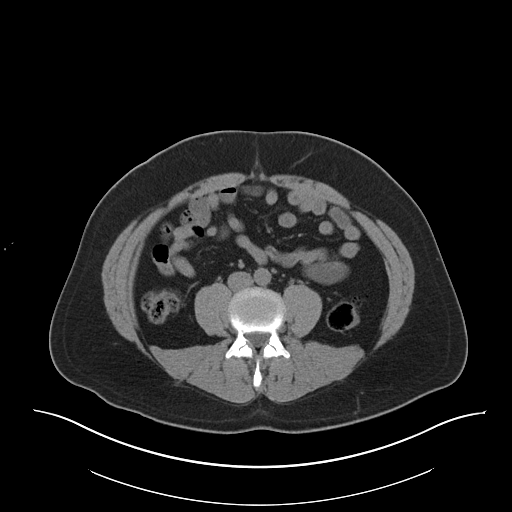
[im 53/94  soft-tissue]
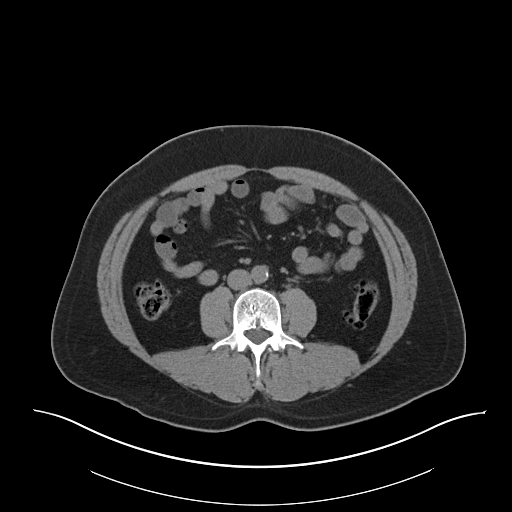
[im 61/94  soft-tissue]
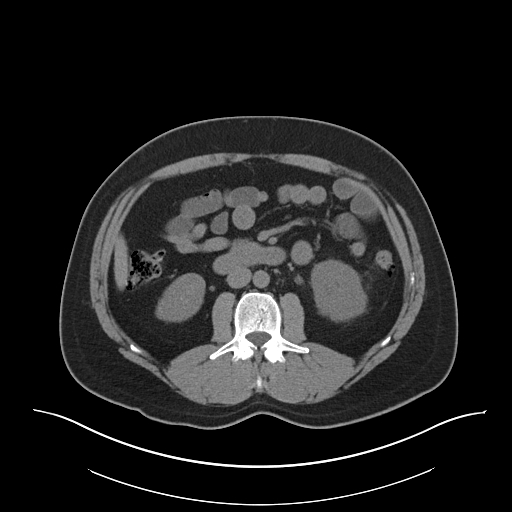
[im 61/94  bone]
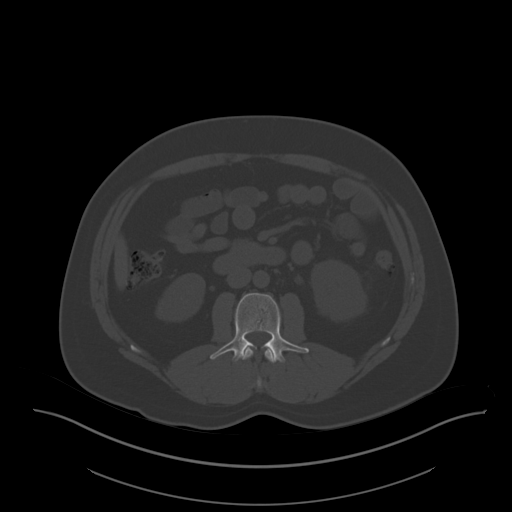
[im 69/94  soft-tissue]
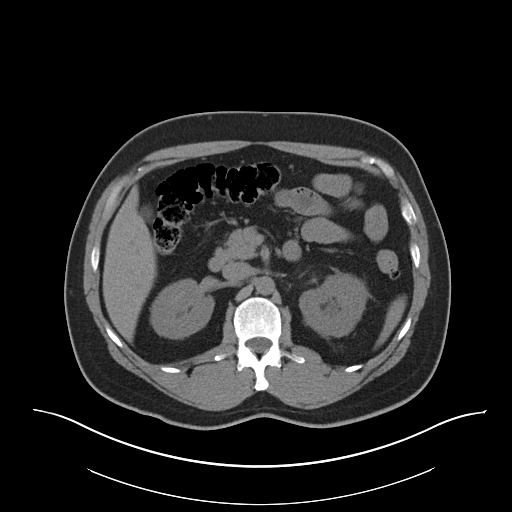
[im 73/94  soft-tissue]
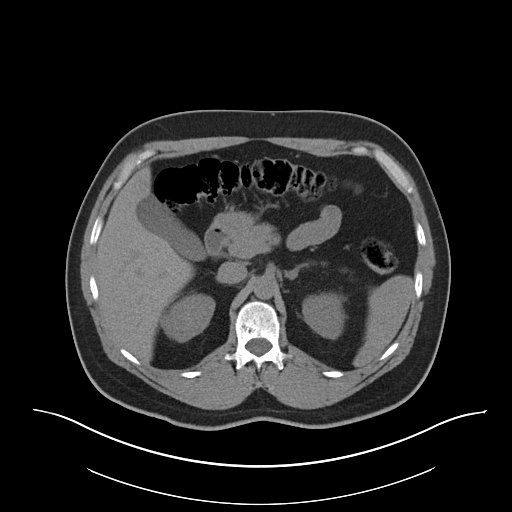
[im 81/94  soft-tissue]
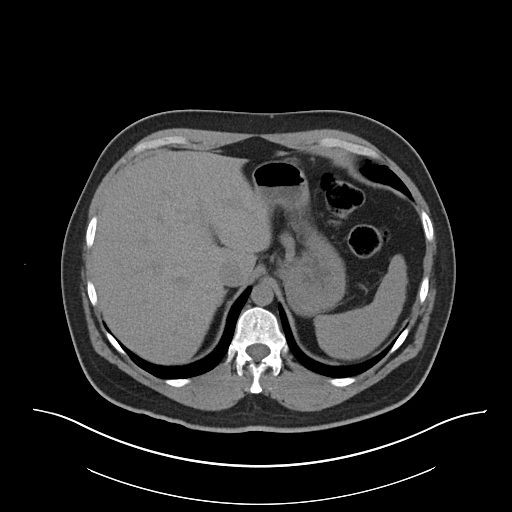
[im 89/94  soft-tissue]
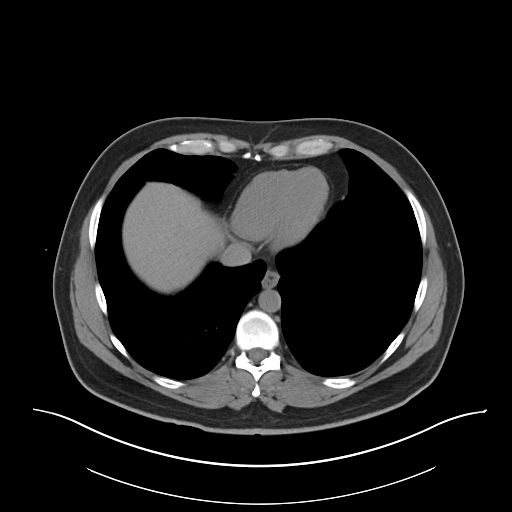

[Series 5: coronal soft tissue · coronal · 0.74mm/px · 3 of 94 slices shown]
[im 32/94  soft-tissue]
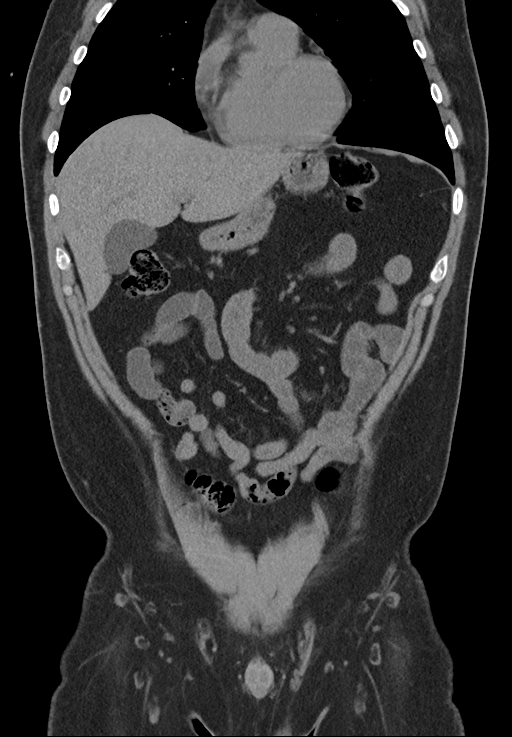
[im 42/94  soft-tissue]
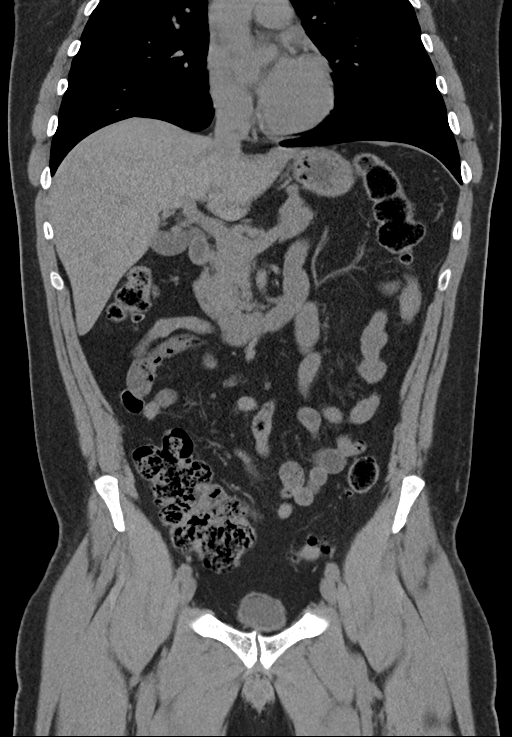
[im 52/94  soft-tissue]
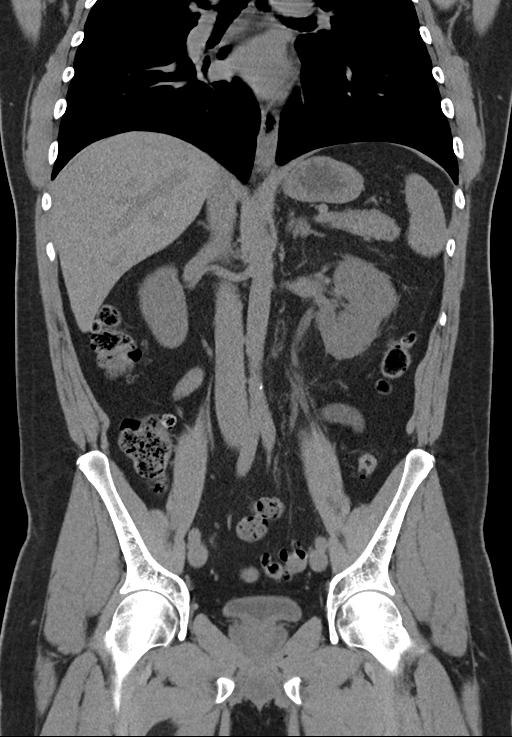

[16 of 46 positions shown; findings below may reference images not displayed]

FINDINGS: 2 mm stone lies at the left ureterovesicular junction causing mild
left hydroureteronephrosis. There is also left renal swelling and
left perinephric stranding.

Possible tiny nonobstructing stone in the upper pole of the left
kidney. No other evidence of an intrarenal stone. No renal masses.
Right renal collecting system ureter are unremarkable. No bladder
mass or stone.

Clear lung bases.  Heart normal in size.

Liver, spleen, gallbladder, pancreas, adrenal glands:  Normal.

No adenopathy.  No ascites.

Scattered colonic diverticula mostly along the sigmoid colon. No
diverticulitis. Colon otherwise unremarkable. Normal small bowel.
Normal appendix.

Mild degenerative changes of the lower lumbar spine. Small
hemangioma in the L2 vertebrae. No osteoblastic or osteolytic
lesions.
IMPRESSION: 1. 2 mm stone in the distal left ureter at the left ureterovesicular
junction. This causes mild left hydroureteronephrosis.
2. No other acute finding.
3. Possible tiny nonobstructing stone in the upper pole of the left
kidney.

## 2014-07-15 MED ORDER — KETOROLAC TROMETHAMINE 15 MG/ML IJ SOLN
15.0000 mg | Freq: Once | INTRAMUSCULAR | Status: AC
  Administered 2014-07-15: 15 mg via INTRAVENOUS
  Filled 2014-07-15: qty 1

## 2014-07-15 MED ORDER — HYDROCODONE-ACETAMINOPHEN 5-325 MG PO TABS: 1.0000 | ORAL_TABLET | Freq: Four times a day (QID) | ORAL | Status: DC | PRN

## 2014-07-15 MED ORDER — SODIUM CHLORIDE 0.9 % IV SOLN
1000.0000 mL | Freq: Once | INTRAVENOUS | Status: AC
  Administered 2014-07-15: 1000 mL via INTRAVENOUS

## 2014-07-15 MED ORDER — HYDROMORPHONE HCL 1 MG/ML IJ SOLN
1.0000 mg | Freq: Once | INTRAMUSCULAR | Status: AC
  Administered 2014-07-15: 1 mg via INTRAVENOUS
  Filled 2014-07-15: qty 1

## 2014-07-15 NOTE — ED Notes (Signed)
Pt. Stated, i was wakened up this morning around 200 with abdominal pain that radiates to the front and back and I felt some nausea.  Also it feels like pressure on my bowels

## 2014-07-15 NOTE — Discharge Instructions (Signed)
As discussed, today's CAT scan demonstrated the presence of a kidney stone.  The stone is about 2 mm in diameter, and is likely to pass on its own.  However, it is important to monitor your condition carefully, and do not hesitate to return here for concerning changes in your condition.   Kidney Stones Kidney stones (urolithiasis) are deposits that form inside your kidneys. The intense pain is caused by the stone moving through the urinary tract. When the stone moves, the ureter goes into spasm around the stone. The stone is usually passed in the urine.  CAUSES   A disorder that makes certain neck glands produce too much parathyroid hormone (primary hyperparathyroidism).  A buildup of uric acid crystals, similar to gout in your joints.  Narrowing (stricture) of the ureter.  A kidney obstruction present at birth (congenital obstruction).  Previous surgery on the kidney or ureters.  Numerous kidney infections. SYMPTOMS   Feeling sick to your stomach (nauseous).  Throwing up (vomiting).  Blood in the urine (hematuria).  Pain that usually spreads (radiates) to the groin.  Frequency or urgency of urination. DIAGNOSIS   Taking a history and physical exam.  Blood or urine tests.  CT scan.  Occasionally, an examination of the inside of the urinary bladder (cystoscopy) is performed. TREATMENT   Observation.  Increasing your fluid intake.  Extracorporeal shock wave lithotripsy--This is a noninvasive procedure that uses shock waves to break up kidney stones.  Surgery may be needed if you have severe pain or persistent obstruction. There are various surgical procedures. Most of the procedures are performed with the use of small instruments. Only small incisions are needed to accommodate these instruments, so recovery time is minimized. The size, location, and chemical composition are all important variables that will determine the proper choice of action for you. Talk to your  health care provider to better understand your situation so that you will minimize the risk of injury to yourself and your kidney.  HOME CARE INSTRUCTIONS   Drink enough water and fluids to keep your urine clear or pale yellow. This will help you to pass the stone or stone fragments.  Strain all urine through the provided strainer. Keep all particulate matter and stones for your health care provider to see. The stone causing the pain may be as small as a grain of salt. It is very important to use the strainer each and every time you pass your urine. The collection of your stone will allow your health care provider to analyze it and verify that a stone has actually passed. The stone analysis will often identify what you can do to reduce the incidence of recurrences.  Only take over-the-counter or prescription medicines for pain, discomfort, or fever as directed by your health care provider.  Make a follow-up appointment with your health care provider as directed.  Get follow-up X-rays if required. The absence of pain does not always mean that the stone has passed. It may have only stopped moving. If the urine remains completely obstructed, it can cause loss of kidney function or even complete destruction of the kidney. It is your responsibility to make sure X-rays and follow-ups are completed. Ultrasounds of the kidney can show blockages and the status of the kidney. Ultrasounds are not associated with any radiation and can be performed easily in a matter of minutes. SEEK MEDICAL CARE IF:  You experience pain that is progressive and unresponsive to any pain medicine you have been prescribed. [facility]  CARE IF:   Pain cannot be controlled with the prescribed medicine.  You have a fever or shaking chills.  The severity or intensity of pain increases over 18 hours and is not relieved by pain medicine.  You develop a new onset of abdominal pain.  You feel faint or pass out.  You  are unable to urinate. MAKE SURE YOU:   Understand these instructions.  Will watch your condition.  Will get help right away if you are not doing well or get worse. Document Released: 04/14/2005 Document Revised: 12/15/2012 Document Reviewed: 09/15/2012 [facility] Patient Information 2015 [place], [place]. This information is not intended to replace advice given to you by your health care provider. Make sure you discuss any questions you have with your health care provider.

## 2014-07-15 NOTE — ED Provider Notes (Signed)
CSN: [id number]     Arrival date & time 07/15/14  8546 History   First MD Initiated Contact with Patient 07/15/14 0902     Chief Complaint  Patient presents with  . Abdominal Pain  . Nausea    HPI  Patient presents with concern of new abdominal pain. Patient awakened with pain about 5 hours ago.  Since that time the pain is been persistent.  Pain is currently 7/10 as he took ibuprofen. Pain is focally in the left lateral abdomen, radiating towards the left inguinal area. No dysuria or hematuria. There is no vomiting, though the patient is nauseous. Pain is sharp, severe, crampy. Patient was well prior to the onset of pain. Patient has no history of kidney stone, nor abdominal surgery. No fever, chills  Past Medical History  Diagnosis Date  . Hyperlipidemia   . Allergy   . BPH (benign prostatic hypertrophy)   . Palpitation     Holter in May 2014 with NSR, PVC, rare PACs  . History of continuous positive airway pressure (CPAP) therapy    Past Surgical History  Procedure Laterality Date  . Circumcision    . Nasal septum surgery     Family History  Problem Relation Age of Onset  . Alcohol abuse Mother   . Hyperlipidemia Mother   . Hypertension Mother   . Hypertension Father   . Hyperlipidemia Father   . Colon cancer Paternal Uncle   . Prostate cancer Neg Hx    History  Substance Use Topics  . Smoking status: Never Smoker   . Smokeless tobacco: Never Used  . Alcohol Use: 0.0 oz/week    0 Standard drinks or equivalent per week     Comment: rarely     Review of Systems  Constitutional:       Per HPI, otherwise negative  HENT:       Per HPI, otherwise negative  Respiratory:       Per HPI, otherwise negative  Cardiovascular:       Per HPI, otherwise negative  Gastrointestinal: Negative for vomiting.  Endocrine:       Negative aside from HPI  Genitourinary:       Neg aside from HPI   Musculoskeletal:       Per HPI, otherwise negative  Skin: Negative.    Neurological: Negative for syncope.      Allergies  Niacin and related; Penicillins; and Rosuvastatin  Home Medications   Prior to Admission medications   Medication Sig Start Date End Date Taking? Authorizing Provider  benazepril (LOTENSIN) 10 MG tablet Take 0.5 tablets (5 mg total) by mouth daily. 06/21/14  Yes Tonia Ghent, MD  ezetimibe-simvastatin (VYTORIN) 10-20 MG per tablet Take 1 tablet by mouth daily. 06/21/14  Yes Tonia Ghent, MD  finasteride (PROSCAR) 5 MG tablet TAKE 1 TABLET (5 MG TOTAL) BY MOUTH DAILY. 06/21/14  Yes Tonia Ghent, MD  omeprazole (PRILOSEC) 10 MG capsule Take 10 mg by mouth daily.   Yes Historical Provider, MD  valACYclovir (VALTREX) 500 MG tablet Take 1 tablet (500 mg total) by mouth daily. 06/21/14  Yes Tonia Ghent, MD  propranolol (INDERAL) 10 MG tablet Take 1 tablet (10 mg total) by mouth 4 (four) times daily. As needed for fluttering 09/24/12   Burtis Junes, NP   BP 111/66 mmHg  Pulse 68  Temp(Src) 97.7 F (36.5 C) (Oral)  Resp 18  Ht 5\' 9"  (1.753 m)  Wt 205 lb (92.987 kg)  BMI 30.26 kg/m2  SpO2 97% Physical Exam  Constitutional: He is oriented to person, place, and time. He appears well-developed. No distress.  HENT:  Head: Normocephalic and atraumatic.  Eyes: Conjunctivae and EOM are normal.  Cardiovascular: Normal rate and regular rhythm.   Pulmonary/Chest: Effort normal. No stridor. No respiratory distress.  Abdominal: He exhibits no distension. There is tenderness.  Tenderness throughout the left lateral and lower abdomen.  Musculoskeletal: He exhibits no edema.  Neurological: He is alert and oriented to person, place, and time.  Skin: Skin is warm and dry.  Psychiatric: He has a normal mood and affect.  Nursing note and vitals reviewed.   ED Course  Procedures (including critical care time) Labs Review Labs Reviewed  COMPREHENSIVE METABOLIC PANEL - Abnormal; Notable for the following:    Glucose, Bld 122 (*)     BUN 25 (*)    Creatinine, Ser 1.38 (*)    AST 44 (*)    GFR calc non Af Amer 57 (*)    GFR calc Af Amer 66 (*)    All other components within normal limits  URINALYSIS, ROUTINE W REFLEX MICROSCOPIC - Abnormal; Notable for the following:    Color, Urine AMBER (*)    APPearance CLOUDY (*)    Hgb urine dipstick LARGE (*)    Ketones, ur 40 (*)    Protein, ur 30 (*)    All other components within normal limits  CBC WITH DIFFERENTIAL/PLATELET  LIPASE, BLOOD  URINE MICROSCOPIC-ADD ON  AMYLASE    Imaging Review Ct Renal Stone Study  07/15/2014   CLINICAL DATA:  LT Lower Abdominal Pain radiating across lower abdomen and LT back with discomfort in backPressure in bowels Nausea No hx of stones Unknown hematuria No hx of CA  EXAM: CT ABDOMEN AND PELVIS WITHOUT CONTRAST  TECHNIQUE: Multidetector CT imaging of the abdomen and pelvis was performed following the standard protocol without IV contrast.  COMPARISON:  None.  FINDINGS: 2 mm stone lies at the left ureterovesicular junction causing mild left hydroureteronephrosis. There is also left renal swelling and left perinephric stranding.  Possible tiny nonobstructing stone in the upper pole of the left kidney. No other evidence of an intrarenal stone. No renal masses. Right renal collecting system ureter are unremarkable. No bladder mass or stone.  Clear lung bases.  Heart normal in size.  Liver, spleen, gallbladder, pancreas, adrenal glands:  Normal.  No adenopathy.  No ascites.  Scattered colonic diverticula mostly along the sigmoid colon. No diverticulitis. Colon otherwise unremarkable. Normal small bowel. Normal appendix.  Mild degenerative changes of the lower lumbar spine. Small hemangioma in the L2 vertebrae. No osteoblastic or osteolytic lesions.  IMPRESSION: 1. 2 mm stone in the distal left ureter at the left ureterovesicular junction. This causes mild left hydroureteronephrosis. 2. No other acute finding. 3. Possible tiny nonobstructing stone in the  upper pole of the left kidney.   Electronically Signed   By: Lajean Manes M.D.   On: 07/15/2014 09:59    On repeat exam the patient is in no distress. No current complaints.   MDM  Patient presents with new left abdominal pain, senna have 2 mm stone. No evidence for ongoing infection, nor obstruction. Patient had resolution of his pain here. Patient was discharged in stable condition to follow-up with urology.    Carmin Muskrat, MD 07/15/14 1040

## 2014-07-15 NOTE — ED Notes (Signed)
Pt is in stable condition upon d/c and ambulates from ED. 

## 2014-07-16 ENCOUNTER — Encounter: Payer: Self-pay | Admitting: Family Medicine

## 2014-07-17 ENCOUNTER — Telehealth: Payer: Self-pay | Admitting: Family Medicine

## 2014-07-17 DIAGNOSIS — N200 Calculus of kidney: Secondary | ICD-10-CM

## 2014-07-17 NOTE — Telephone Encounter (Signed)
Called pt.  No true allergy sx, just mild itching on opiate.  It helps the pain.  Can try plain claritin for itching, can try 1/2 dose of pain med.  He agrees.  Call back as needed.  He thanked me for the call.

## 2014-07-17 NOTE — Telephone Encounter (Signed)
Pt was at hospital over weekend and needs referral to urologist that accepts first health insurance.  The hospital gave him a referral but the provider ([facility]) is not in the network.  Best number to call pt back is [phone].

## 2014-07-18 NOTE — Telephone Encounter (Signed)
Please see below.  I put in the new referral.  Thanks.

## 2014-08-11 ENCOUNTER — Ambulatory Visit
Admit: 2014-08-11 | Disposition: A | Discharge status: Home / Self Care | Payer: Self-pay | Attending: Urology | Admitting: Urology

## 2014-08-11 IMAGING — US US RENAL KIDNEY
1 series · 14 of 25 positions shown · non-contrast
Comparison: None

CLINICAL DATA: Evaluate for left kidney stone.

EXAM:
RENAL/URINARY TRACT ULTRASOUND COMPLETE

[Series 1: us renal kidney · 0.28mm/px · 14 of 44 slices shown]
[im 1/44]
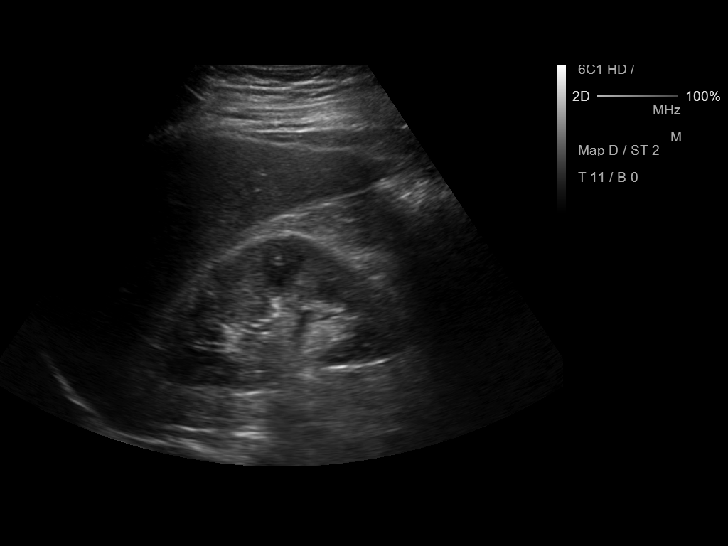
[im 4/44]
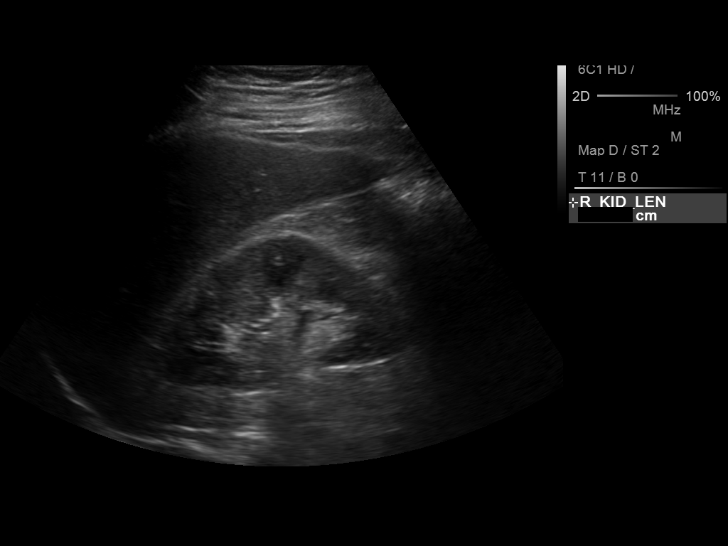
[im 8/44]
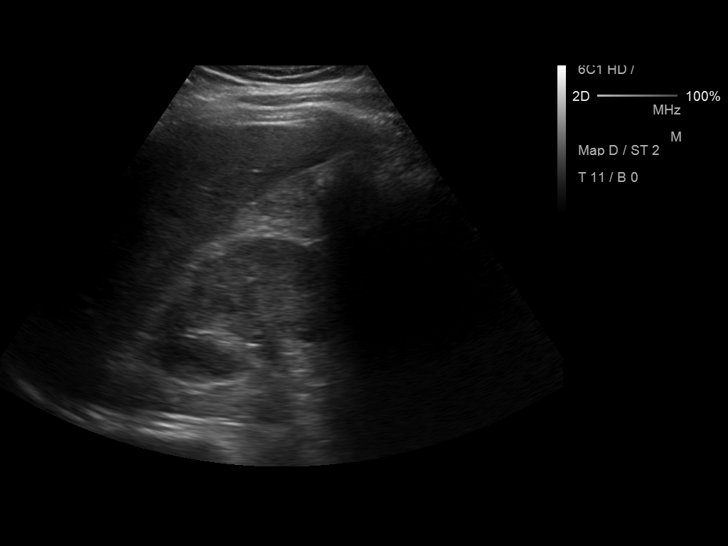
[im 11/44]
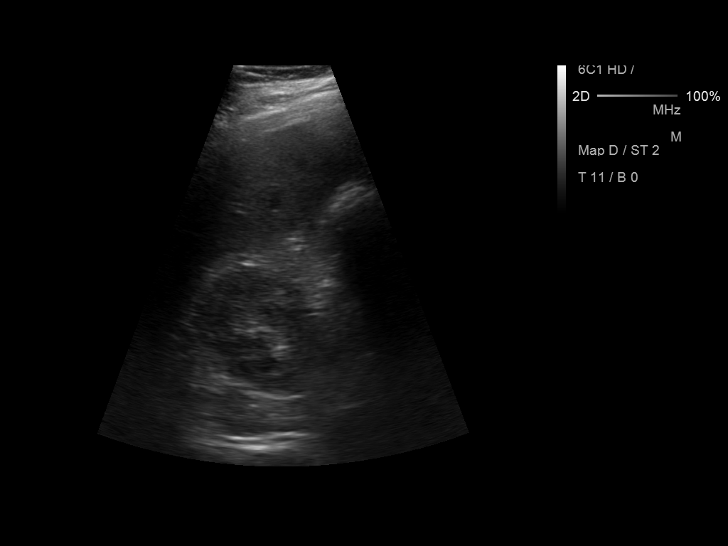
[im 15/44]
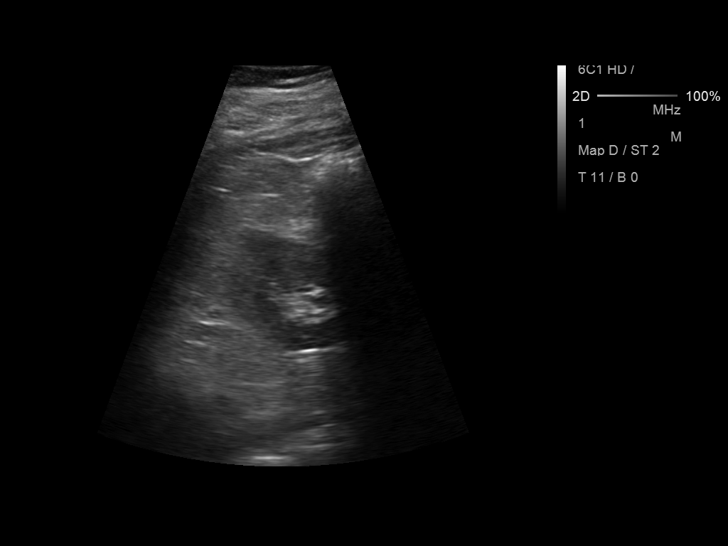
[im 17/44]
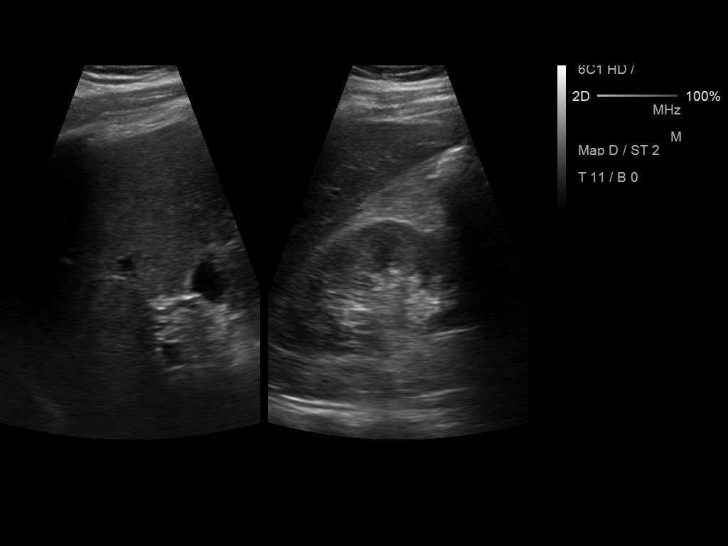
[im 20/44]
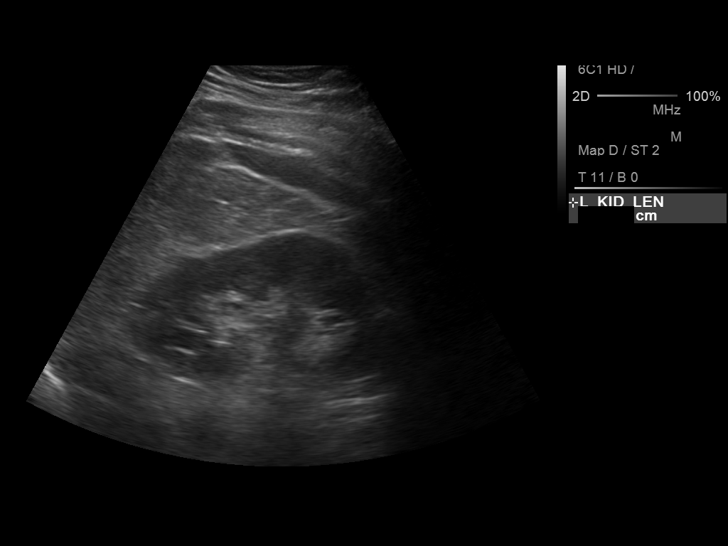
[im 24/44]
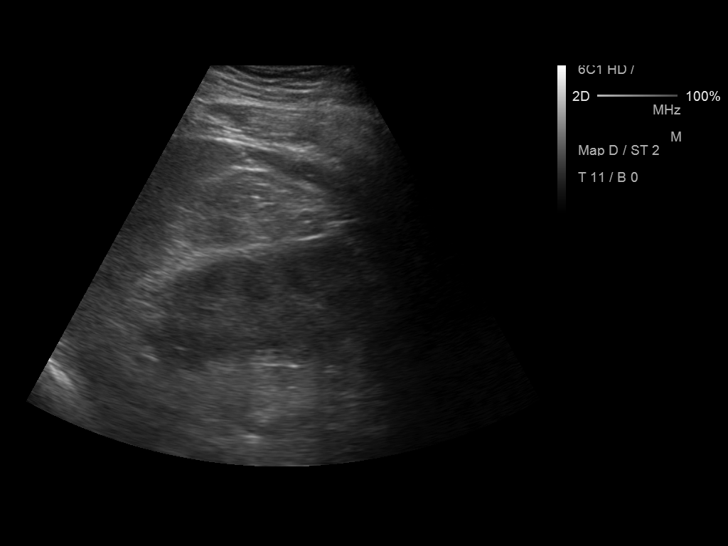
[im 27/44]
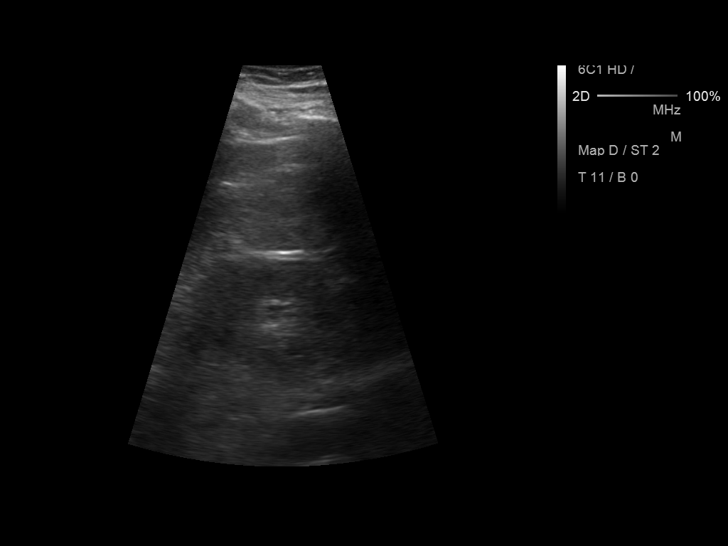
[im 29/44]
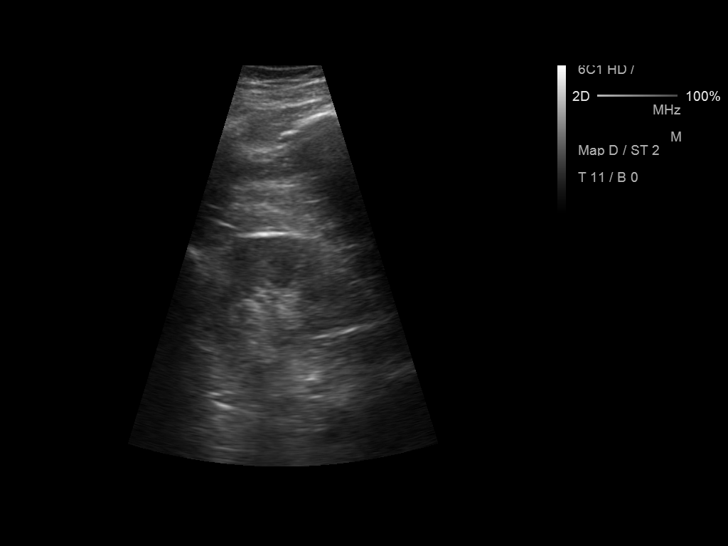
[im 33/44]
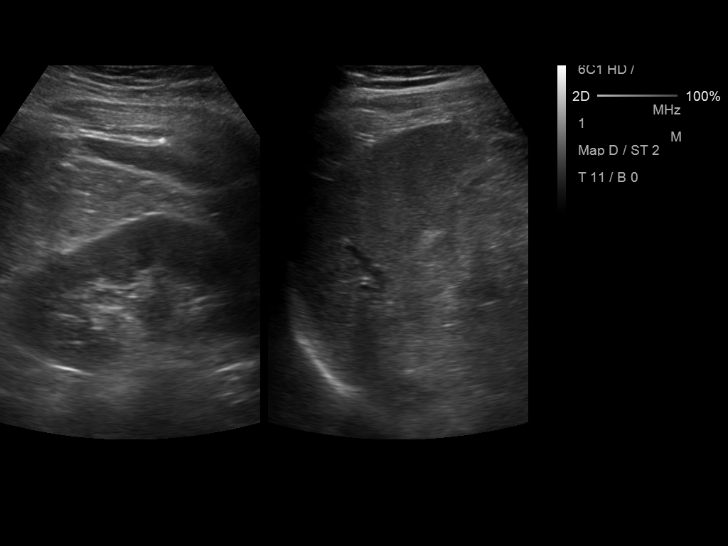
[im 36/44]
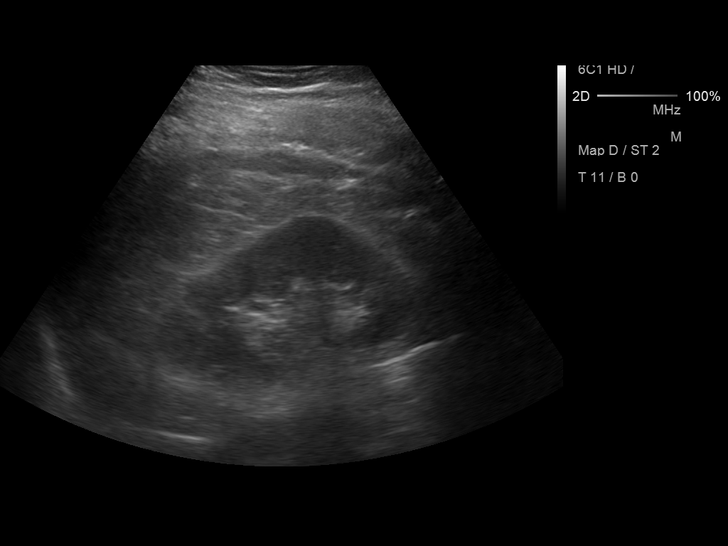
[im 40/44]
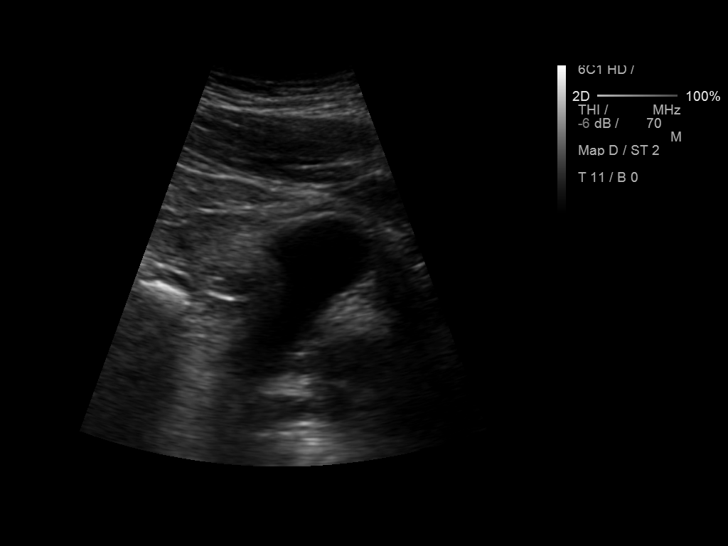
[im 44/44]
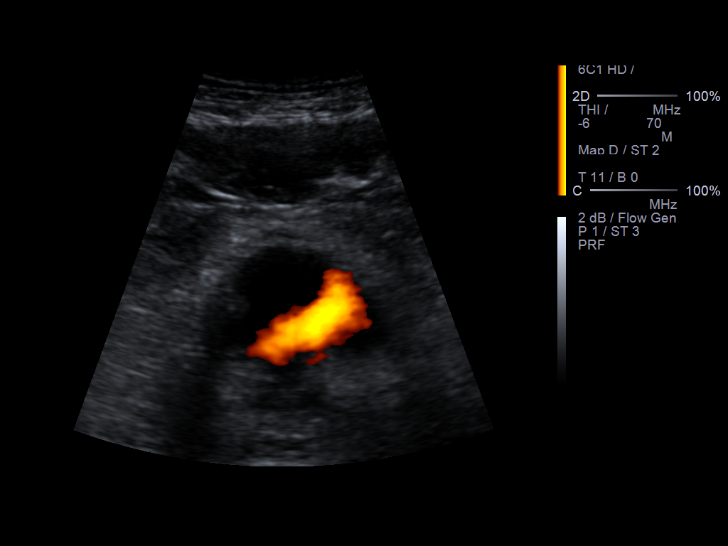

[14 of 25 positions shown; findings below may reference images not displayed]

FINDINGS: Right Kidney:

Length: 10.5. Echogenicity within normal limits. No mass or
hydronephrosis visualized.

Left Kidney:

Length: 10.4 cm. Echogenicity within normal limits. No mass or
hydronephrosis visualized.

Bladder:

Appears normal for degree of bladder distention. Bilateral ureteral
jets identified.
IMPRESSION: 1. No evidence for nephrolithiasis or obstructive uropathy.

## 2015-01-09 ENCOUNTER — Encounter: Payer: Self-pay | Admitting: Primary Care

## 2015-01-09 ENCOUNTER — Ambulatory Visit (INDEPENDENT_AMBULATORY_CARE_PROVIDER_SITE_OTHER): Payer: PRIVATE HEALTH INSURANCE | Admitting: Primary Care

## 2015-01-09 VITALS — BP 108/78 | HR 85 | Temp 98.1°F | Ht 69.0 in | Wt 213.4 lb

## 2015-01-09 DIAGNOSIS — H6692 Otitis media, unspecified, left ear: Secondary | ICD-10-CM

## 2015-01-09 MED ORDER — SULFAMETHOXAZOLE-TRIMETHOPRIM 800-160 MG PO TABS: 1.0000 | ORAL_TABLET | Freq: Two times a day (BID) | ORAL | Status: DC

## 2015-01-09 NOTE — Progress Notes (Signed)
Pre visit review using our clinic review tool, if applicable. No additional management support is needed unless otherwise documented below in the visit note. 

## 2015-01-09 NOTE — Progress Notes (Signed)
Subjective:    Patient ID: Joseph Drake, male    DOB: [birth year], 54 y.o.   MRN: [id number]  HPI  Joseph Drake is a 54 year old male who presents today with a chief complaint of ear fullness. His ear fullness is present to his left ear and began 1 week ago. He also reports a cough has been present for 4 weeks with chest congestion that has now moved into his nasal cavities. Overall the cough and chest congestion has improved. He also reports fatigue and doesn't feel well overall.  Denies fevers, body aches, ear pain. He's taken OTC decongestants without much relief and Flonase with some relief.  Review of Systems  Constitutional: Positive for fatigue. Negative for fever and chills.  HENT: Positive for congestion. Negative for ear pain, postnasal drip, rhinorrhea, sinus pressure and sore throat.        Left ear fullness.  Respiratory: Positive for cough. Negative for shortness of breath.   Cardiovascular: Negative for chest pain.  Gastrointestinal: Negative for nausea.  Musculoskeletal: Negative for myalgias.       Past Medical History  Diagnosis Date  . Hyperlipidemia   . Allergy   . BPH (benign prostatic hypertrophy)   . Palpitation     Holter in May 2014 with NSR, PVC, rare PACs  . History of continuous positive airway pressure (CPAP) therapy     Social History   Social History  . Marital Status: Married    Spouse Name: N/A  . Number of Children: N/A  . Years of Education: N/A   Occupational History  . Not on file.   Social History Main Topics  . Smoking status: Never Smoker   . Smokeless tobacco: Never Used  . Alcohol Use: 0.0 oz/week    0 Standard drinks or equivalent per week     Comment: rarely   . Drug Use: No  . Sexual Activity: Yes   Other Topics Concern  . Not on file   Social History Narrative   Drafting, structural Chief Strategy Officer for bridges   Remarried 2002   1 daughter from 1st marriage   2 stepkids from wife's first marriage   [employer] fan     Past Surgical History  Procedure Laterality Date  . Circumcision    . Nasal septum surgery      Family History  Problem Relation Age of Onset  . Alcohol abuse Mother   . Hyperlipidemia Mother   . Hypertension Mother   . Hypertension Father   . Hyperlipidemia Father   . Colon cancer Paternal Uncle   . Prostate cancer Neg Hx     Allergies  Allergen Reactions  . Niacin And Related Hives  . Penicillins     rash  . Rosuvastatin     REACTION: myalgia    Current Outpatient Prescriptions on File Prior to Visit  Medication Sig Dispense Refill  . benazepril (LOTENSIN) 10 MG tablet Take 0.5 tablets (5 mg total) by mouth daily. 30 tablet 6  . ezetimibe-simvastatin (VYTORIN) 10-20 MG per tablet Take 1 tablet by mouth daily. 30 tablet 12  . finasteride (PROSCAR) 5 MG tablet TAKE 1 TABLET (5 MG TOTAL) BY MOUTH DAILY. 30 tablet 12  . omeprazole (PRILOSEC) 10 MG capsule Take 10 mg by mouth daily.    . valACYclovir (VALTREX) 500 MG tablet Take 1 tablet (500 mg total) by mouth daily. 30 tablet 12   No current facility-administered medications on file prior to visit.    BP 108/78  mmHg  Pulse 85  Temp(Src) 98.1 F (36.7 C) (Oral)  Ht 5\' 9"  (1.753 m)  Wt 213 lb 6.4 oz (96.798 kg)  BMI 31.50 kg/m2  SpO2 97%    Objective:   Physical Exam  Constitutional: He appears well-nourished.  HENT:  Right Ear: Tympanic membrane and ear canal normal.  Left Ear: There is swelling. No tenderness. Tympanic membrane is injected and erythematous.  Cardiovascular: Normal rate and regular rhythm.   Pulmonary/Chest: Effort normal and breath sounds normal.  Skin: Skin is warm and dry.          Assessment & Plan:  Acute Otitis Media:  Present to left ear with erythema and injection to TM and swelling to the canal. No effusion noted, non tender. RX for Bactrim DS BID x 5 days. Recommended daily antihistamine for ear fullness. He is to follow up PRN if no improvement.

## 2015-01-09 NOTE — Patient Instructions (Signed)
Start Bactrim DS antibiotic for ear infection. Take 1 tablet by mouth twice daily for 5 days.  For ear fullness, consider taking a daily antihistamine such as Claritin, Zyrtec, or Allegra.  Continue Flonase as needed.  Follow up if no improvement.  It was a pleasure meeting you!

## 2015-06-29 ENCOUNTER — Other Ambulatory Visit: Payer: Self-pay | Admitting: Family Medicine

## 2015-06-29 NOTE — Telephone Encounter (Signed)
Pt left v/m that he has scheduled CPX with Dr Damita Dunnings on 07/06/15 at 9:45 and request refills from [facility] be done until pt can be seen.

## 2015-06-29 NOTE — Telephone Encounter (Signed)
Electronic refill request. Last OV with you 12/27/13.  Acute OV's since that time with various providers. Last Filled:   06/21/14 on all meds.  No upcoming appt scheduled.  Please advise.

## 2015-07-01 NOTE — Telephone Encounter (Signed)
Sent. Thanks.   

## 2015-07-02 ENCOUNTER — Other Ambulatory Visit: Payer: Self-pay | Admitting: Family Medicine

## 2015-07-02 DIAGNOSIS — E785 Hyperlipidemia, unspecified: Secondary | ICD-10-CM

## 2015-07-02 DIAGNOSIS — N400 Enlarged prostate without lower urinary tract symptoms: Secondary | ICD-10-CM

## 2015-07-02 DIAGNOSIS — Z125 Encounter for screening for malignant neoplasm of prostate: Secondary | ICD-10-CM

## 2015-07-03 ENCOUNTER — Other Ambulatory Visit (INDEPENDENT_AMBULATORY_CARE_PROVIDER_SITE_OTHER): Payer: PRIVATE HEALTH INSURANCE

## 2015-07-03 DIAGNOSIS — E785 Hyperlipidemia, unspecified: Secondary | ICD-10-CM

## 2015-07-03 DIAGNOSIS — N400 Enlarged prostate without lower urinary tract symptoms: Secondary | ICD-10-CM

## 2015-07-03 LAB — LIPID PANEL
Cholesterol: 128 mg/dL (ref 0–200)
HDL: 34.3 mg/dL — ABNORMAL LOW (ref >39.00)
LDL Cholesterol: 71 mg/dL (ref 0–99)
NonHDL: 93.39
Total CHOL/HDL Ratio: 4
Triglycerides: 114 mg/dL (ref 0.0–149.0)
VLDL: 22.8 mg/dL (ref 0.0–40.0)

## 2015-07-03 LAB — COMPREHENSIVE METABOLIC PANEL
ALT: 27 U/L (ref 0–53)
AST: 25 U/L (ref 0–37)
Albumin: 4.3 g/dL (ref 3.5–5.2)
Alkaline Phosphatase: 84 U/L (ref 39–117)
BUN: 15 mg/dL (ref 6–23)
CO2: 28 mEq/L (ref 19–32)
Calcium: 9.4 mg/dL (ref 8.4–10.5)
Chloride: 105 mEq/L (ref 96–112)
Creatinine, Ser: 1.14 mg/dL (ref 0.40–1.50)
GFR: 70.69 mL/min (ref >60.00)
Glucose, Bld: 97 mg/dL (ref 70–99)
Potassium: 4.7 mEq/L (ref 3.5–5.1)
Sodium: 141 mEq/L (ref 135–145)
Total Bilirubin: 0.7 mg/dL (ref 0.2–1.2)
Total Protein: 6.9 g/dL (ref 6.0–8.3)

## 2015-07-03 LAB — PSA: PSA: 0.17 ng/mL (ref 0.10–4.00)

## 2015-07-06 ENCOUNTER — Encounter: Payer: Self-pay | Admitting: Family Medicine

## 2015-07-06 ENCOUNTER — Ambulatory Visit (INDEPENDENT_AMBULATORY_CARE_PROVIDER_SITE_OTHER): Payer: PRIVATE HEALTH INSURANCE | Admitting: Family Medicine

## 2015-07-06 VITALS — BP 112/72 | HR 79 | Temp 98.4°F | Ht 69.0 in | Wt 209.5 lb

## 2015-07-06 DIAGNOSIS — Z7189 Other specified counseling: Secondary | ICD-10-CM

## 2015-07-06 DIAGNOSIS — I10 Essential (primary) hypertension: Secondary | ICD-10-CM

## 2015-07-06 DIAGNOSIS — Z0001 Encounter for general adult medical examination with abnormal findings: Secondary | ICD-10-CM | POA: Diagnosis not present

## 2015-07-06 DIAGNOSIS — M25561 Pain in right knee: Secondary | ICD-10-CM

## 2015-07-06 DIAGNOSIS — Z119 Encounter for screening for infectious and parasitic diseases, unspecified: Secondary | ICD-10-CM

## 2015-07-06 DIAGNOSIS — G4733 Obstructive sleep apnea (adult) (pediatric): Secondary | ICD-10-CM

## 2015-07-06 DIAGNOSIS — R6889 Other general symptoms and signs: Secondary | ICD-10-CM | POA: Diagnosis not present

## 2015-07-06 DIAGNOSIS — Z1211 Encounter for screening for malignant neoplasm of colon: Secondary | ICD-10-CM

## 2015-07-06 DIAGNOSIS — E785 Hyperlipidemia, unspecified: Secondary | ICD-10-CM

## 2015-07-06 NOTE — Progress Notes (Signed)
Pre visit review using our clinic review tool, if applicable. No additional management support is needed unless otherwise documented below in the visit note.  CPE- See plan.  Routine anticipatory guidance given to patient.  See health maintenance. Flu done at work 01/2015 Tetanus 2015 PNA and shingles not due.  Colonoscopy last done 10 years ago.  FH noted.  D/w pt.  Refer to GI.  PSA wnl.  D/w pt.  He'll f/u with uro re: BPH sx.   Living will- wife designated if patient were incapacitated.   Pt opts in for HCV screening.  D/w pt re: routine screening.   HIV screening done, neg, in 1990s.   Diet and exercise, d/w pt.  Better with diet than exercise.  Intentional weight loss recently.   Neck pain after fall last year, occ sx.  Still with twinges of pain in the L neck with extremes of ROM, looking back to the left.  No weakness.    Hypertension:    Using medication without problems or lightheadedness: yes Chest pain with exertion:no Edema:no Short of breath:no  Elevated Cholesterol: Using medications without problems:yes Muscle aches: no Diet compliance:yes Exercise:encouragd.  Labs d/w pt.  vytorin is expensive for patient.  He can tolerate it.    Knee pain.  H/o patellar dislocation in teenage years.  No sx for years, until his 56s.  Now with trying to do mult sets of stairs for exercise.  Prev with patellar pain.  Better with rest.  Then returned with return to stair exercises.    OSA.  Needs a new mask, rx done and given to patient at [facility].   PMH and SH reviewed  Meds, vitals, and allergies reviewed.   ROS: See HPI.  Otherwise negative.    GEN: nad, alert and oriented HEENT: mucous membranes moist NECK: supple w/o LA CV: rrr. PULM: ctab, no inc wob ABD: soft, +bs EXT: no edema SKIN: no acute rash R knee with normal ROM, ACL MCL LCL feel solid.  No click, no locking on ROM. Normal patellar ROM when knee is lax.   R hip abductors weaker than quad.

## 2015-07-06 NOTE — Patient Instructions (Addendum)
Rosaria Ferries will call about your referral. I would work on range of motion exercises for your neck and update [place] if needed, ie if more pain.   Price check separate simvastatin and zetia.  Let me know.  Possible patellofemoral pain.  Straight leg raises, externally rotated straight leg raises, side leg lifts.   Set of 5--->7--->10--->12--->15, each side.  Take care.  Glad to see you.

## 2015-07-09 DIAGNOSIS — Z7189 Other specified counseling: Secondary | ICD-10-CM | POA: Insufficient documentation

## 2015-07-09 DIAGNOSIS — Z0001 Encounter for general adult medical examination with abnormal findings: Secondary | ICD-10-CM | POA: Insufficient documentation

## 2015-07-09 DIAGNOSIS — M25569 Pain in unspecified knee: Secondary | ICD-10-CM | POA: Insufficient documentation

## 2015-07-09 NOTE — Assessment & Plan Note (Signed)
rx done for mask for cpap.

## 2015-07-09 NOTE — Assessment & Plan Note (Signed)
Controlled, continue as is.  D/w pt.  He agrees.  Labs d/w pt.  

## 2015-07-09 NOTE — Assessment & Plan Note (Signed)
He'll price check separate simvastatin and zetia and let me know.  Continue as is if possible.

## 2015-07-09 NOTE — Assessment & Plan Note (Signed)
Possible patellofemoral pain. Straight leg raises, externally rotated straight leg raises, side leg lifts.  Set of 5--->7--->10--->12--->15, each side.

## 2015-07-09 NOTE — Assessment & Plan Note (Addendum)
Flu done at work 01/2015 Tetanus 2015 PNA and shingles not due.  Colonoscopy last done 10 years ago.  FH noted.  D/w pt.  Refer to GI.  PSA wnl.  D/w pt.  He'll f/u with uro re: BPH sx.   Living will- wife designated if patient were incapacitated.   Pt opts in for HCV screening.  D/w pt re: routine screening.   HIV screening done, neg, in 1990s.   Diet and exercise, d/w pt.  Better with diet than exercise.  Intentional weight loss recently.  Benign neck exam today, he'll work on range of motion exercises for the neck and update [place] if needed, ie if more pain.

## 2015-08-12 ENCOUNTER — Other Ambulatory Visit: Payer: Self-pay | Admitting: Urology

## 2015-08-12 DIAGNOSIS — N400 Enlarged prostate without lower urinary tract symptoms: Secondary | ICD-10-CM

## 2015-08-13 NOTE — Telephone Encounter (Signed)
Pt pharmacy sent a refill request of tamsulosin. Pt does have a 1 year f/u on file therefore a 30 days with 1 refill was given to get him to his appt.

## 2015-08-27 ENCOUNTER — Encounter: Payer: Self-pay | Admitting: Internal Medicine

## 2015-08-30 ENCOUNTER — Encounter: Payer: Self-pay | Admitting: Gastroenterology

## 2015-09-20 ENCOUNTER — Encounter: Payer: Self-pay | Admitting: *Deleted

## 2015-09-21 ENCOUNTER — Encounter: Payer: Self-pay | Admitting: Urology

## 2015-09-21 ENCOUNTER — Ambulatory Visit (INDEPENDENT_AMBULATORY_CARE_PROVIDER_SITE_OTHER): Payer: PRIVATE HEALTH INSURANCE | Admitting: Urology

## 2015-09-21 VITALS — BP 110/59 | HR 81 | Ht 69.0 in | Wt 208.0 lb

## 2015-09-21 DIAGNOSIS — F524 Premature ejaculation: Secondary | ICD-10-CM | POA: Diagnosis not present

## 2015-09-21 DIAGNOSIS — N528 Other male erectile dysfunction: Secondary | ICD-10-CM

## 2015-09-21 DIAGNOSIS — N5319 Other ejaculatory dysfunction: Secondary | ICD-10-CM

## 2015-09-21 DIAGNOSIS — N401 Enlarged prostate with lower urinary tract symptoms: Secondary | ICD-10-CM | POA: Diagnosis not present

## 2015-09-21 DIAGNOSIS — N138 Other obstructive and reflux uropathy: Secondary | ICD-10-CM

## 2015-09-21 DIAGNOSIS — N529 Male erectile dysfunction, unspecified: Secondary | ICD-10-CM

## 2015-09-21 NOTE — Progress Notes (Signed)
09/21/2015 9:15 AM   Joseph Drake 1960/11/03 [id number]  Referring provider: Tonia Ghent, MD [address] [place], [place] [address]  Chief Complaint  Patient presents with  . Benign Prostatic Hypertrophy    1 year follow up    HPI: Patient is a 55 year old Caucasian male who presents today for his yearly follow-up for BPH with LUTS.  Joseph Drake is having some significant ejaculatory disorder and concerning decrease in erectile function that has been getting progressively worse since he has been on the tamsulosin and finasteride.  He states he is experiencing a reduction of the volume of the semen and retrograde ejaculation since being on the medication.  He has also noted that his libido has decreased significantly.  His orgasms are becoming weaker and he is suffering from premature ejaculation.  BPH WITH LUTS His IPSS score today is 15, which is moderate lower urinary tract symptomatology. He is mixed with his quality life due to his urinary symptoms.  His major complaint today weak urinary stream and hesitancy.  He has noted a 75% improvement with his urination since being on the tamsulosin and finasteride and his nocturia has abated.   He has had these symptoms for over two years.  He denies any dysuria, hematuria or suprapubic pain.   He also denies any recent fevers, chills, nausea or vomiting.   He does not have a family history of PCa.      IPSS      09/21/15 0800       International Prostate Symptom Score   How often have you had the sensation of not emptying your bladder? Less than half the time     How often have you had to urinate less than every two hours? Less than half the time     How often have you found you stopped and started again several times when you urinated? About half the time     How often have you found it difficult to postpone urination? Less than half the time     How often have you had a weak urinary stream? More than half the time       How often have you had to strain to start urination? Not at All     How many times did you typically get up at night to urinate? 2 Times     Total IPSS Score 15     Quality of Life due to urinary symptoms   If you were to spend the rest of your life with your urinary condition just the way it is now how would you feel about that? Mixed        Score:  1-7 Mild 8-19 Moderate 20-35 Severe     PMH: Past Medical History  Diagnosis Date  . Hyperlipidemia   . Allergy   . BPH (benign prostatic hypertrophy)   . Palpitation     Holter in May 2014 with NSR, PVC, rare PACs  . History of continuous positive airway pressure (CPAP) therapy   . ED (erectile dysfunction)   . Palpitation   . Sleep apnea   . Genital herpes   . Candida infection   . Testosterone deficiency   . LVH (left ventricular hypertrophy)   . Dyspnea   . Acid reflux   . HTN (hypertension)   . Obesity   . Kidney stone on left side   . Hydronephrosis     Surgical History: Past Surgical History  Procedure Laterality  Date  . Circumcision    . Nasal septum surgery      Home Medications:    Medication List       This list is accurate as of: 09/21/15  9:15 AM.  Always use your most recent med list.               benazepril 10 MG tablet  Commonly known as:  LOTENSIN  TAKE 0.5 TABLETS (5 MG TOTAL) BY MOUTH DAILY.     cetirizine 10 MG tablet  Commonly known as:  ZYRTEC  Take 10 mg by mouth daily.     finasteride 5 MG tablet  Commonly known as:  PROSCAR  TAKE 1 TABLET (5 MG TOTAL) BY MOUTH DAILY.     fluticasone 50 MCG/ACT nasal spray  Commonly known as:  FLONASE  Place 2 sprays into both nostrils daily.     omeprazole 10 MG capsule  Commonly known as:  PRILOSEC  Take 10 mg by mouth daily.     tamsulosin 0.4 MG Caps capsule  Commonly known as:  FLOMAX  TAKE ONE CAPSULE BY MOUTH EVERY DAY     valACYclovir 500 MG tablet  Commonly known as:  VALTREX  TAKE 1 TABLET (500 MG TOTAL) BY MOUTH  DAILY.     VYTORIN 10-20 MG tablet  Generic drug:  ezetimibe-simvastatin  TAKE 1 TABLET BY MOUTH DAILY.        Allergies:  Allergies  Allergen Reactions  . Niacin And Related Hives  . Penicillins     rash  . Rosuvastatin     REACTION: myalgia    Family History: Family History  Problem Relation Age of Onset  . Alcohol abuse Mother   . Hyperlipidemia Mother   . Hypertension Mother   . Hypertension Father   . Hyperlipidemia Father   . Colon cancer Paternal Uncle   . Prostate cancer Neg Hx   . Kidney disease Neg Hx     Social History:  reports that he has never smoked. He has never used smokeless tobacco. He reports that he drinks alcohol. He reports that he does not use illicit drugs.  ROS: UROLOGY Frequent Urination?: No Hard to postpone urination?: No Burning/pain with urination?: No Get up at night to urinate?: Yes Leakage of urine?: No Urine stream starts and stops?: No Trouble starting stream?: Yes Do you have to strain to urinate?: No Blood in urine?: No Urinary tract infection?: No Sexually transmitted disease?: No Injury to kidneys or bladder?: No Painful intercourse?: No Weak stream?: No Erection problems?: No Penile pain?: No  Gastrointestinal Nausea?: No Vomiting?: No Indigestion/heartburn?: No Diarrhea?: No Constipation?: No  Constitutional Fever: No Night sweats?: No Weight loss?: No Fatigue?: No  Skin Skin rash/lesions?: No Itching?: No  Eyes Blurred vision?: No Double vision?: No  Ears/Nose/Throat Sore throat?: No Sinus problems?: No  Hematologic/Lymphatic Swollen glands?: No Easy bruising?: No  Cardiovascular Leg swelling?: No Chest pain?: No  Respiratory Cough?: No Shortness of breath?: No  Endocrine Excessive thirst?: No  Musculoskeletal Back pain?: No Joint pain?: No  Neurological Headaches?: No Dizziness?: No  Psychologic Depression?: No Anxiety?: No  Physical Exam: BP 110/59 mmHg  Pulse 81   Ht 5\' 9"  (1.753 m)  Wt 208 lb (94.348 kg)  BMI 30.70 kg/m2  Constitutional: Well nourished. Alert and oriented, No acute distress. HEENT: [place] AT, moist mucus membranes. Trachea midline, no masses. Cardiovascular: No clubbing, cyanosis, or edema. Respiratory: Normal respiratory effort, no increased work of breathing. GI: Abdomen  is soft, non tender, non distended, no abdominal masses. Liver and spleen not palpable.  No hernias appreciated.  Stool sample for occult testing is not indicated.   GU: No CVA tenderness.  No bladder fullness or masses.  Patient with circumcised phallus.   Urethral meatus is patent.  No penile discharge. No penile lesions or rashes. Scrotum without lesions, cysts, rashes and/or edema.  Testicles are located scrotally bilaterally. No masses are appreciated in the testicles. Left and right epididymis are normal. Rectal: Patient with  normal sphincter tone. Anus and perineum without scarring or rashes. No rectal masses are appreciated. Prostate is approximately 50 grams, deep median sulcus, no nodules are appreciated. Seminal vesicles are normal. Skin: No rashes, bruises or suspicious lesions. Lymph: No cervical or inguinal adenopathy. Neurologic: Grossly intact, no focal deficits, moving all 4 extremities. Psychiatric: Normal mood and affect.  Laboratory Data: Lab Results  Component Value Date   WBC 9.6 07/15/2014   HGB 15.4 07/15/2014   HCT 44.4 07/15/2014   MCV 88.4 07/15/2014   PLT 217 07/15/2014    Lab Results  Component Value Date   CREATININE 1.14 07/03/2015    Lab Results  Component Value Date   PSA 0.17 07/03/2015   PSA 0.17 01/06/2014   PSA 0.20 02/21/2013    Lab Results  Component Value Date   TESTOSTERONE 425.26 06/19/2008     Lab Results  Component Value Date   TSH 1.96 02/21/2013       Component Value Date/Time   CHOL 128 07/03/2015 0916   HDL 34.30* 07/03/2015 0916   CHOLHDL 4 07/03/2015 0916   CHOLHDL 8.0 CALC 04/15/2006 1015    VLDL 22.8 07/03/2015 0916   LDLCALC 71 07/03/2015 0916    Lab Results  Component Value Date   AST 25 07/03/2015   Lab Results  Component Value Date   ALT 27 07/03/2015     Assessment & Plan:    1. BPH with LUTS:   Patient's IPSS score is 15/3.  Patient and I discussed the mechanism of action of his tamsulosin and finasteride.  I explained to him that the tamsulosin is in alpha-blocker which relaxes the smooth muscle encircling the prostate and bladder neck.  This results in an opening of the prostatic urethra and bladder neck facilitating emptying of the bladder.  The finasteride blocks the testosterone stimulation of the prostate therefore maintaining the size of the prostate or in some cases decreasing the size of the prostate.  It is true that some patients lower urinary tract symptoms can be managed with 1 or the other, but like blood pressure medications, some patients will need to address their lower urinary tract symptoms with the 2 different mechanisms of actions.  In relation to the ejaculatory disorder and the reduction of semen volume, I had a frank discussion with the patient that as a man ages and the prostate is enlarged is in size, it can be expected that the amount of semen and a forceful ejaculation can be reduced.  This can occur even without medication as the prostate will enlarge as a man ages.  He has the options to stop the tamsulosin and finasteride and see if his sexual function and ejaculatory disorders improve, but his urinary symptoms may worsen.  A bladder outlet procedure may be an option to improve his lower urinary tract symptoms, but these procedures run the risk of ejaculatory disorders and erectile dysfunction.  I suggested that we try Cialis 5 mg daily in place  of the tamsulosin and continue the finasteride.  I explained to the patient that Cialis is a PDE 5 inhibitor which has been known to improve sexual function and also improve lower urinary tract symptoms.   He would like to try this approach.  I have given him a 30 day supply of Cialis 5 mg samples to take daily.  He will continue the finasteride.  He will return in 3 weeks for and I PSS score and discussion concerning the change in his medication.  2. Erectile dysfunction:    I explained to the patient that in order to achieve an erection it takes good functioning of the nervous system (parasympathetic, sympathetic, sensory and motor), good blood flow into the erectile tissue of the penis and a desire to have sex.   I stated that conditions like diabetes, hypertension, coronary artery disease, peripheral vascular disease, smoking, alcohol consumption, age and BPH can diminish the ability to have an erection.   He is given a 30 day supply of the Cialis 5 mg samples.  Since this medication can also improve erectile dysfunction, this may be effective in treating his erectile dysfunction and ejaculatory disorder.  He will return in 3 weeks for an SHIM and discussion concerning the change in his medication.  3. Ejaculatory disorder:   I had a frank discussion with the patient concerning his less forceful ejaculation.  I explained to him that because of his age and prostate size, even with discontinuing the medication or with the addition of Cialis, his ejaculations may not return to the forceful kinds he has had in the past.   4. Premature ejaculation:  Patient is having premature ejaculation.  He is starting Cialis 5 mg daily.  Sometimes PDE 5 inhibitors are effective in treating premature ejaculation, so we will reassess his symptoms when he returns in 3 weeks.  I had a 40 minute discussion with this patient face-to-face concerning his BPH, erectile dysfunction, ejaculatory disorder and premature ejaculation.  Greater than 50% was spent in counseling & coordination of care with the patient.  Return in about 3 weeks (around 10/12/2015) for IPSS score and dicussion on new med.  These notes generated with voice  recognition software. I apologize for typographical errors.  Zara Council, [facility] [address], [place], [place] [address] [phone]

## 2015-10-09 ENCOUNTER — Encounter: Payer: Self-pay | Admitting: Urology

## 2015-10-09 ENCOUNTER — Ambulatory Visit (INDEPENDENT_AMBULATORY_CARE_PROVIDER_SITE_OTHER): Payer: PRIVATE HEALTH INSURANCE | Admitting: Urology

## 2015-10-09 VITALS — BP 120/75 | HR 44 | Ht 69.0 in | Wt 210.0 lb

## 2015-10-09 DIAGNOSIS — N138 Other obstructive and reflux uropathy: Secondary | ICD-10-CM

## 2015-10-09 DIAGNOSIS — N401 Enlarged prostate with lower urinary tract symptoms: Secondary | ICD-10-CM

## 2015-10-09 DIAGNOSIS — N528 Other male erectile dysfunction: Secondary | ICD-10-CM | POA: Diagnosis not present

## 2015-10-09 DIAGNOSIS — F524 Premature ejaculation: Secondary | ICD-10-CM

## 2015-10-09 DIAGNOSIS — N529 Male erectile dysfunction, unspecified: Secondary | ICD-10-CM

## 2015-10-09 DIAGNOSIS — N5319 Other ejaculatory dysfunction: Secondary | ICD-10-CM | POA: Diagnosis not present

## 2015-10-09 MED ORDER — TAMSULOSIN HCL 0.4 MG PO CAPS: 0.4000 mg | ORAL_CAPSULE | Freq: Every day | ORAL | Status: DC

## 2015-10-09 NOTE — Progress Notes (Signed)
1:55 PM   Joseph Drake Aug 15, 1960 [id number]  Referring provider: Tonia Ghent, MD [address] [place], [place] [address]  Chief Complaint  Patient presents with  . Benign Prostatic Hypertrophy    3 weeks follow up  . Erectile Dysfunction    HPI: Patient is a 55 year old Caucasian male who presents today for a three week follow up after a trial of Cialis 5 mg daily in place of tamsulosin.    Background history [name] was having some significant ejaculatory disorder and concerning decrease in erectile function that has been getting progressively worse since he has been on the tamsulosin and finasteride.  He states he is experiencing a reduction of the volume of the semen and retrograde ejaculation since being on the medication.  He has also noted that his libido has decreased significantly.  His orgasms are becoming weaker and he is suffering from premature ejaculation.  We had a discontinue the tamsulosin and start Cialis 5 mg daily to see if he would have improvement with his premature ejaculation and perhaps increase the volume of his ejaculate fluid.  He stated he could not tolerate the Cialis due to it causing all over body aches.  He did note a little more volume in the semen, but it was not significant enough to continue the Cialis.  BPH WITH LUTS His IPSS score today is 8, which is moderate lower urinary tract symptomatology. He is mixed with his quality life due to his urinary symptoms.  His major complaint today weak urinary stream and hesitancy.  He has noted a 75% improvement with his urination since being on the tamsulosin and finasteride and his nocturia has abated.   He has had these symptoms for over two years.  He denies any dysuria, hematuria or suprapubic pain.   He also denies any recent fevers, chills, nausea or vomiting.   He does not have a family history of PCa.  We had him discontinue the tamsulosin and start Cialis 5 mg daily to see if he would  have greater improvement in his lower urinary tract symptoms.  He did note any improvement in his urinary symptoms with the Cialis, but he had adverse side effects with the medication.      IPSS      09/21/15 0800 10/09/15 1300     International Prostate Symptom Score   How often have you had the sensation of not emptying your bladder? Less than half the time Not at All    How often have you had to urinate less than every two hours? Less than half the time Less than half the time    How often have you found you stopped and started again several times when you urinated? About half the time Less than half the time    How often have you found it difficult to postpone urination? Less than half the time Not at All    How often have you had a weak urinary stream? More than half the time About half the time    How often have you had to strain to start urination? Not at All Not at All    How many times did you typically get up at night to urinate? 2 Times 1 Time    Total IPSS Score 15 8    Quality of Life due to urinary symptoms   If you were to spend the rest of your life with your urinary condition just the way it  is now how would you feel about that? Mixed Mixed       Score:  1-7 Mild 8-19 Moderate 20-35 Severe   PMH: Past Medical History  Diagnosis Date  . Hyperlipidemia   . Allergy   . BPH (benign prostatic hypertrophy)   . Palpitation     Holter in May 2014 with NSR, PVC, rare PACs  . History of continuous positive airway pressure (CPAP) therapy   . ED (erectile dysfunction)   . Palpitation   . Sleep apnea   . Genital herpes   . Candida infection   . Testosterone deficiency   . LVH (left ventricular hypertrophy)   . Dyspnea   . Acid reflux   . HTN (hypertension)   . Obesity   . Kidney stone on left side   . Hydronephrosis     Surgical History: Past Surgical History  Procedure Laterality Date  . Circumcision    . Nasal septum surgery      Home Medications:      Medication List       This list is accurate as of: 10/09/15  1:55 PM.  Always use your most recent med list.               benazepril 10 MG tablet  Commonly known as:  LOTENSIN  TAKE 0.5 TABLETS (5 MG TOTAL) BY MOUTH DAILY.     cetirizine 10 MG tablet  Commonly known as:  ZYRTEC  Take 10 mg by mouth daily.     finasteride 5 MG tablet  Commonly known as:  PROSCAR  TAKE 1 TABLET (5 MG TOTAL) BY MOUTH DAILY.     fluticasone 50 MCG/ACT nasal spray  Commonly known as:  FLONASE  Place 2 sprays into both nostrils daily.     omeprazole 10 MG capsule  Commonly known as:  PRILOSEC  Take 10 mg by mouth daily.     tamsulosin 0.4 MG Caps capsule  Commonly known as:  FLOMAX  Take 1 capsule (0.4 mg total) by mouth daily.     valACYclovir 500 MG tablet  Commonly known as:  VALTREX  TAKE 1 TABLET (500 MG TOTAL) BY MOUTH DAILY.     VYTORIN 10-20 MG tablet  Generic drug:  ezetimibe-simvastatin  TAKE 1 TABLET BY MOUTH DAILY.        Allergies:  Allergies  Allergen Reactions  . Niacin And Related Hives  . Penicillins     rash  . Rosuvastatin     REACTION: myalgia    Family History: Family History  Problem Relation Age of Onset  . Alcohol abuse Mother   . Hyperlipidemia Mother   . Hypertension Mother   . Hypertension Father   . Hyperlipidemia Father   . Colon cancer Paternal Uncle   . Prostate cancer Neg Hx   . Kidney disease Neg Hx     Social History:  reports that he has never smoked. He has never used smokeless tobacco. He reports that he drinks alcohol. He reports that he does not use illicit drugs.  ROS: UROLOGY Frequent Urination?: No Hard to postpone urination?: No Burning/pain with urination?: No Get up at night to urinate?: No Leakage of urine?: No Urine stream starts and stops?: No Trouble starting stream?: No Do you have to strain to urinate?: No Blood in urine?: No Urinary tract infection?: No Sexually transmitted disease?: No Injury to kidneys  or bladder?: No Painful intercourse?: No Weak stream?: No Erection problems?: No Penile pain?: No  Gastrointestinal  Nausea?: No Vomiting?: No Indigestion/heartburn?: No Diarrhea?: No Constipation?: No  Constitutional Fever: No Night sweats?: No Weight loss?: No Fatigue?: No  Skin Skin rash/lesions?: No Itching?: No  Eyes Blurred vision?: No Double vision?: No  Ears/Nose/Throat Sore throat?: No Sinus problems?: No  Hematologic/Lymphatic Swollen glands?: No Easy bruising?: No  Cardiovascular Leg swelling?: No Chest pain?: No  Respiratory Cough?: No Shortness of breath?: No  Endocrine Excessive thirst?: No  Musculoskeletal Back pain?: No Joint pain?: No  Neurological Headaches?: No Dizziness?: No  Psychologic Depression?: No Anxiety?: No  Physical Exam: BP 120/75 mmHg  Pulse 44  Ht 5\' 9"  (1.753 m)  Wt 210 lb (95.255 kg)  BMI 31.00 kg/m2  Constitutional: Well nourished. Alert and oriented, No acute distress. HEENT: [place] AT, moist mucus membranes. Trachea midline, no masses. Cardiovascular: No clubbing, cyanosis, or edema. Respiratory: Normal respiratory effort, no increased work of breathing. Skin: No rashes, bruises or suspicious lesions. Lymph: No cervical or inguinal adenopathy. Neurologic: Grossly intact, no focal deficits, moving all 4 extremities. Psychiatric: Normal mood and affect.  Laboratory Data: Lab Results  Component Value Date   WBC 9.6 07/15/2014   HGB 15.4 07/15/2014   HCT 44.4 07/15/2014   MCV 88.4 07/15/2014   PLT 217 07/15/2014    Lab Results  Component Value Date   CREATININE 1.14 07/03/2015    Lab Results  Component Value Date   PSA 0.17 07/03/2015   PSA 0.17 01/06/2014   PSA 0.20 02/21/2013    Lab Results  Component Value Date   TESTOSTERONE 425.26 06/19/2008     Lab Results  Component Value Date   TSH 1.96 02/21/2013       Component Value Date/Time   CHOL 128 07/03/2015 0916   HDL 34.30*  07/03/2015 0916   CHOLHDL 4 07/03/2015 0916   CHOLHDL 8.0 CALC 04/15/2006 1015   VLDL 22.8 07/03/2015 0916   LDLCALC 71 07/03/2015 0916    Lab Results  Component Value Date   AST 25 07/03/2015   Lab Results  Component Value Date   ALT 27 07/03/2015     Assessment & Plan:    1. BPH with LUTS:   Patient's IPSS score is 8/3.  Patient did find improvement with the Cialis 5 mg daily with his lower urinary tract symptoms, but he developed severe muscle aches and had to discontinue the medication. He has restarted the tamsulosin.  We will see him in one year for I PSS score, PSA and exam.  2. Erectile dysfunction:    Patient did not experience much change with his erections with the Cialis 5 mg daily.  He stated the erections are satisfactory, its the premature ejaculation, reduction in semen volume in the less forceful orgasms that are bothersome to him.  We'll see him in one year for a SHIM score and exam.  3. Ejaculatory disorder:   I had a frank discussion with the patient concerning his less forceful ejaculation.  I explained to him that because of his age and prostate size, even with discontinuing the medication or with the addition of Cialis, his ejaculations may not return to the forceful kinds he has had in the past.  The Cialis did not prove to be effective in improving his ejaculatory disorder and patient experience severe muscle aches with the Cialis.  4. Premature ejaculation:  No change with premature ejaculation.  I explained to the patient that in some cases people have been prescribed Zoloft which is an antidepressant with the side  effect of delayed ejaculation as a possible treatment of premature ejaculation. He stated he was not interested in pursuing a medical treatment for his condition at this time. He stated if things worsened he will contact the office, otherwise we'll see him in one year.  Return in about 1 year (around 10/08/2016) for IPSS, SHIM, PSA and exam.  These  notes generated with voice recognition software. I apologize for typographical errors.  Zara Council, [facility] [address] [place], [place] [address] [phone]

## 2015-10-13 ENCOUNTER — Other Ambulatory Visit: Payer: Self-pay | Admitting: Urology

## 2015-11-07 ENCOUNTER — Ambulatory Visit (AMBULATORY_SURGERY_CENTER): Payer: Self-pay | Admitting: *Deleted

## 2015-11-07 VITALS — Ht 69.0 in | Wt 210.8 lb

## 2015-11-07 DIAGNOSIS — Z1211 Encounter for screening for malignant neoplasm of colon: Secondary | ICD-10-CM

## 2015-11-07 MED ORDER — NA SULFATE-K SULFATE-MG SULF 17.5-3.13-1.6 GM/177ML PO SOLN: 1.0000 | Freq: Once | ORAL | Status: DC

## 2015-11-07 NOTE — Progress Notes (Signed)
Denies allergies to eggs or soy products. Denies complications with sedation or anesthesia. Denies O2 use. Denies use of diet or weight loss medications.  Emmi instructions given for colonoscopy.  

## 2015-11-12 ENCOUNTER — Encounter: Payer: Self-pay | Admitting: Gastroenterology

## 2015-11-21 ENCOUNTER — Encounter: Payer: Self-pay | Admitting: Gastroenterology

## 2015-11-21 ENCOUNTER — Ambulatory Visit (AMBULATORY_SURGERY_CENTER): Payer: PRIVATE HEALTH INSURANCE | Admitting: Gastroenterology

## 2015-11-21 VITALS — BP 106/70 | HR 66 | Temp 96.8°F | Resp 10 | Ht 69.0 in | Wt 210.0 lb

## 2015-11-21 DIAGNOSIS — K635 Polyp of colon: Secondary | ICD-10-CM | POA: Diagnosis not present

## 2015-11-21 DIAGNOSIS — Z1211 Encounter for screening for malignant neoplasm of colon: Secondary | ICD-10-CM

## 2015-11-21 DIAGNOSIS — D123 Benign neoplasm of transverse colon: Secondary | ICD-10-CM | POA: Diagnosis not present

## 2015-11-21 NOTE — Patient Instructions (Signed)

## 2015-11-21 NOTE — Progress Notes (Signed)
Called to room to assist during endoscopic procedure.  Patient ID and intended procedure confirmed with present staff. Received instructions for my participation in the procedure from the performing physician.  

## 2015-11-21 NOTE — Op Note (Signed)
[facility] Patient Name: Joseph Drake Procedure Date: 11/21/2015 8:49 AM MRN: [id number] Endoscopist: Milus Banister , MD Age: 55 Referring MD:  Date of Birth: 08/12/1960 Gender: Male Account #: [id number] Procedure:                Colonoscopy Indications:              Screening for colorectal malignant neoplasm;                            colonoscopy Dr. Sharlett Iles 2007 showed no polyps Medicines:                Monitored Anesthesia Care Procedure:                Pre-Anesthesia Assessment:                           - Prior to the procedure, a History and Physical                            was performed, and patient medications and                            allergies were reviewed. The patient's tolerance of                            previous anesthesia was also reviewed. The risks                            and benefits of the procedure and the sedation                            options and risks were discussed with the patient.                            All questions were answered, and informed consent                            was obtained. Prior Anticoagulants: The patient has                            taken no previous anticoagulant or antiplatelet                            agents. ASA Grade Assessment: II - A patient with                            mild systemic disease. After reviewing the risks                            and benefits, the patient was deemed in                            satisfactory condition to undergo the procedure.  After obtaining informed consent, the colonoscope                            was passed under direct vision. Throughout the                            procedure, the patient's blood pressure, pulse, and                            oxygen saturations were monitored continuously. The                            Model CF-HQ190L [id number]) scope was introduced                            through the anus  and advanced to the the cecum,                            identified by appendiceal orifice and ileocecal                            valve. The colonoscopy was performed without                            difficulty. The patient tolerated the procedure                            well. The quality of the bowel preparation was                            excellent. The ileocecal valve, appendiceal                            orifice, and rectum were photographed. Scope In: 9:03:45 AM Scope Out: 9:18:07 AM Scope Withdrawal Time: 0 hours 10 minutes 49 seconds  Total Procedure Duration: 0 hours 14 minutes 22 seconds  Findings:                 A 4 mm polyp was found in the transverse colon. The                            polyp was sessile. The polyp was removed with a                            cold snare. Resection and retrieval were complete.                           Multiple medium-mouthed diverticula were found in                            the left colon.                           The exam was otherwise without abnormality on  direct and retroflexion views. Complications:            No immediate complications. Estimated blood loss:                            None. Estimated Blood Loss:     Estimated blood loss: none. Impression:               - One 4 mm polyp in the transverse colon, removed                            with a cold snare. Resected and retrieved.                           - Diverticulosis in the left colon.                           - The examination was otherwise normal on direct                            and retroflexion views. Recommendation:           - Patient has a contact number available for                            emergencies. The signs and symptoms of potential                            delayed complications were discussed with the                            patient. Return to normal activities tomorrow.                             Written discharge instructions were provided to the                            patient.                           - Resume previous diet.                           - Continue present medications.                           You will receive a letter within 2-3 weeks with the                            pathology results and my final recommendations.                           If the polyp(s) is proven to be 'pre-cancerous' on                            pathology, you will need repeat colonoscopy in 5  years. If the polyp(s) is NOT 'precancerous' on                            pathology then you should repeat colon cancer                            screening in 10 years with colonoscopy without need                            for colon cancer screening by any method prior to                            then (including stool testing). Milus Banister, MD 11/21/2015 9:20:42 AM This report has been signed electronically.

## 2015-11-21 NOTE — Progress Notes (Signed)
Pt.soft and expelling air large amount,stated that he still felt a little pressure encouraged to continue to pass air, asked pt. If he wanted to stay or go home pt. Stated that he was ok to go home.

## 2015-11-21 NOTE — Progress Notes (Signed)
Report to PACU, RN, vss, BBS= Clear.  

## 2015-11-22 ENCOUNTER — Telehealth: Payer: Self-pay | Admitting: *Deleted

## 2015-11-22 NOTE — Telephone Encounter (Signed)
  Follow up Call-  Call back number 11/21/2015  Post procedure Call Back phone  # [phone]  Permission to leave phone message Yes  Some recent data might be hidden    Pt was asleep, spoke with wife Patient questions:  Do you have a fever, pain , or abdominal swelling? No. Pain Score  0 *  Have you tolerated food without any problems? Yes.    Have you been able to return to your normal activities? Yes.    Do you have any questions about your discharge instructions: Diet   No. Medications  No. Follow up visit  No.  Do you have questions or concerns about your Care? No.  Actions: * If pain score is 4 or above: No action needed, pain <4.

## 2015-11-27 ENCOUNTER — Encounter: Payer: Self-pay | Admitting: Gastroenterology

## 2015-12-20 ENCOUNTER — Telehealth: Payer: Self-pay | Admitting: Gastroenterology

## 2015-12-20 NOTE — Telephone Encounter (Signed)
Joseph Drake, please call him. Please apologize for the delay in getting back to him about the biopsies from his colon polyp. This same problem happened to 5 other people around that same time. Let him know that his polyp was not precancerous and that he needs recall colonoscopy in 10 years.

## 2015-12-20 NOTE — Telephone Encounter (Signed)
Spoke with the patient. Pathology results given and recall date. Apology given and well received by the patient.

## 2016-01-12 ENCOUNTER — Ambulatory Visit (INDEPENDENT_AMBULATORY_CARE_PROVIDER_SITE_OTHER): Payer: PRIVATE HEALTH INSURANCE | Admitting: Family Medicine

## 2016-01-12 ENCOUNTER — Encounter: Payer: Self-pay | Admitting: Family Medicine

## 2016-01-12 DIAGNOSIS — M79672 Pain in left foot: Secondary | ICD-10-CM | POA: Insufficient documentation

## 2016-01-12 MED ORDER — PREDNISONE 10 MG PO TABS: ORAL_TABLET | ORAL | 0 refills | Status: DC

## 2016-01-12 NOTE — Progress Notes (Signed)
  Tommi Rumps, MD Phone: [phone]  Joseph Drake is a 55 y.o. male who presents today for same-day visit.  Patient notes onset of left first MTP joint discomfort on Thursday. Notes it has progressively gotten worse. It has gotten to the point he couldn't sleep last night. He notes if he puts pressure on it while stepping or any other kind of pressure on the outside of the joint there is some discomfort. It is somewhat swollen. There is some mild erythema. There is no warmth. He has not had any fevers. He has had similar symptoms in the past that typically resolve over the period of a day. He's been trying ibuprofen 600 mg several times a day with no benefit. No prior and firmed diagnosis of gout though notes this was mentioned in the past when he was evaluated for this.  PMH: nonsmoker.   ROS see history of present illness  Objective  Physical Exam Vitals:   01/12/16 1240  BP: 118/80  Pulse: 96  Resp: 20  Temp: 98.6 F (37 C)    BP Readings from Last 3 Encounters:  01/12/16 118/80  11/21/15 106/70  10/09/15 120/75   Wt Readings from Last 3 Encounters:  01/12/16 211 lb (95.7 kg)  11/21/15 210 lb (95.3 kg)  11/07/15 210 lb 12.8 oz (95.6 kg)    Physical Exam  Constitutional: He is well-developed, well-nourished, and in no distress.  Cardiovascular: Normal rate, regular rhythm and normal heart sounds.   Pulmonary/Chest: Effort normal and breath sounds normal.  Musculoskeletal:  Left first MTP with mild swelling and overlying erythema with no warmth, he has full range of motion of the joint, there is tenderness in the lateral joint line of the first MTP, there is no other tenderness in the foot, left foot is warm and well perfused, right foot with no abnormalities  Neurological: He is alert.  Walks with mild limp favoring left foot  Skin: Skin is warm and dry.     Assessment/Plan: Please see individual problem list.  Left foot pain Patient with left foot pain at  first MTP joint. History consistent with gout with recurrent episodes of similar symptoms in the past. Could be related to arthritis as well. We'll treat with prednisone as this would be beneficial for either. If not improving by early next week he will follow-up. Will need a uric acid at some point in the future. Given return precautions.   No orders of the defined types were placed in this encounter.   Meds ordered this encounter  Medications  . predniSONE (DELTASONE) 10 MG tablet    Sig: Please take 50 mg (5 tablets) by mouth today, then decrease by 1 tablet daily until gone    Dispense:  15 tablet    Refill:  0    Tommi Rumps, MD [place]

## 2016-01-12 NOTE — Progress Notes (Signed)
Pre visit review using our clinic review tool, if applicable. No additional management support is needed unless otherwise documented below in the visit note. 

## 2016-01-12 NOTE — Assessment & Plan Note (Signed)
Patient with left foot pain at first MTP joint. History consistent with gout with recurrent episodes of similar symptoms in the past. Could be related to arthritis as well. We'll treat with prednisone as this would be beneficial for either. If not improving by early next week he will follow-up. Will need a uric acid at some point in the future. Given return precautions.

## 2016-01-12 NOTE — Patient Instructions (Addendum)
Nice to see you. Your symptoms are concerning for gout though could also be related to arthritis. We will start you on prednisone to help with your discomfort. At some point in the future you should have a uric acid tested. If you're symptoms are not improving by early next week follow-up with your PCP.

## 2016-01-14 ENCOUNTER — Telehealth: Payer: Self-pay

## 2016-01-14 NOTE — Telephone Encounter (Signed)
Per chart review pt was seen at [facility].

## 2016-01-14 NOTE — Telephone Encounter (Signed)
PLEASE NOTE: All timestamps contained within this report are represented as [place] Standard Time. CONFIDENTIALTY NOTICE: This fax transmission is intended only for the addressee. It contains information that is legally privileged, confidential or otherwise protected from use or disclosure. If you are not the intended recipient, you are strictly prohibited from reviewing, disclosing, copying using or disseminating any of this information or taking any action in reliance on or regarding this information. If you have received this fax in error, please notify us immediately by telephone so that we can arrange for its return to [place]. Phone: [phone], Toll-Free: [phone], Fax: [phone] Page: 1 of 2 Call Id: [id number] [place] Patient Name: Joseph Drake Gender: Male DOB: [birth year] Age: 55 Y 3 M 9 D Return Phone Number: [phone] (Primary) Address: City/State/Zip: [place] Client [place] [facility] - Client Client Site [place] Physician Renford Dills - MD Contact Type Call Who Is Calling Patient / Member / Family / Caregiver Call Type Triage / Clinical Relationship To Patient Self Return Phone Number [phone] (Primary) Chief Complaint Foot Pain Reason for Call Symptomatic / Request for [place] states he is having gout or arthritis in his left foot. PreDisposition Call Doctor Translation No Nurse Assessment Nurse: Lillia Corporal, RN, Lenell Antu Date/Time Eilene Ghazi Time): 01/12/2016 9:02:35 AM Confirm and document reason for call. If symptomatic, describe symptoms. You must click the next button to save text entered. ---Caller states he is having gout or arthritis in his left foot. States the pain started Thursday. States over several years he would have pain on foot and it would go away. States this time it  has gotten progressively worse. States foot is slightly swollen and mildly red. States cannot stand on foot. States pain is mostly on base joint of great toe. States eats red meats frequently. States rarely drinks alcohol. Takes a medicine for HTN it rises occasionally. Has high cholesterol. Has the patient traveled out of the country within the last 30 days? ---Not Applicable Does the patient have any new or worsening symptoms? ---Yes Will a triage be completed? ---Yes Related visit to physician within the last 2 weeks? ---No Does the PT have any chronic conditions? (i.e. diabetes, asthma, etc.) ---Yes List chronic conditions. ---HTN, Is this a behavioral health or substance abuse call? ---No Guidelines Guideline Title Affirmed Question Affirmed Notes Nurse Date/Time Eilene Ghazi Time) Foot Pain [1] SEVERE pain (e.g., excruciating, unable to do any normal activities) AND [2] not improved Lillia Corporal, RN, Lenell Antu 01/12/2016 9:06:25 AM PLEASE NOTE: All timestamps contained within this report are represented as [place] Standard Time. CONFIDENTIALTY NOTICE: This fax transmission is intended only for the addressee. It contains information that is legally privileged, confidential or otherwise protected from use or disclosure. If you are not the intended recipient, you are strictly prohibited from reviewing, disclosing, copying using or disseminating any of this information or taking any action in reliance on or regarding this information. If you have received this fax in error, please notify us immediately by telephone so that we can arrange for its return to [place]. Phone: [phone], Toll-Free: [phone], Fax: [phone] Page: 2 of 2 Call Id: [id number] Guidelines Guideline Title Affirmed Question Affirmed Notes Nurse Date/Time Eilene Ghazi Time) after 2 hours of pain medicine Disp. Time Eilene Ghazi Time) Disposition Final User 01/12/2016 9:12:16 AM See Physician within 4 Hours (or PCP triage) Yes  Lillia Corporal, RN, Allean Found Understands:  Yes Disagree/Comply: Comply Care Advice Given Per Guideline SEE PHYSICIAN WITHIN 4 HOURS (or PCP triage): CALL BACK IF: * You become worse. CARE ADVICE given per Foot Pain (Adult) guideline. Comments User: Arcola Jansky, RN Date/Time (Eastern Time): 01/12/2016 9:07:51 AM PRS 6/10 last night, states standing and walking makes pain worse. User: Arcola Jansky, RN Date/Time Eilene Ghazi Time): 01/12/2016 9:08:13 AM Has tried ibuprofen with no help. User: Arcola Jansky, RN Date/Time Eilene Ghazi Time): 01/12/2016 9:13:20 AM [facility] contacted and appointment made for patient for 12:45 pm at Sat clinic. Referrals [facility]

## 2016-01-14 NOTE — Telephone Encounter (Signed)
Noted. Thanks.

## 2016-01-15 ENCOUNTER — Encounter: Payer: Self-pay | Admitting: Family Medicine

## 2016-01-15 ENCOUNTER — Ambulatory Visit (INDEPENDENT_AMBULATORY_CARE_PROVIDER_SITE_OTHER): Payer: PRIVATE HEALTH INSURANCE | Admitting: Family Medicine

## 2016-01-15 VITALS — BP 122/84 | HR 74 | Temp 98.3°F | Wt 212.8 lb

## 2016-01-15 DIAGNOSIS — Z119 Encounter for screening for infectious and parasitic diseases, unspecified: Secondary | ICD-10-CM

## 2016-01-15 DIAGNOSIS — M109 Gout, unspecified: Secondary | ICD-10-CM

## 2016-01-15 MED ORDER — PREDNISONE 10 MG PO TABS: ORAL_TABLET | ORAL | 0 refills | Status: DC

## 2016-01-15 MED ORDER — COLCHICINE 0.6 MG PO TABS: 0.6000 mg | ORAL_TABLET | Freq: Every day | ORAL | 0 refills | Status: DC | PRN

## 2016-01-15 NOTE — Progress Notes (Signed)
Pre visit review using our clinic review tool, if applicable. No additional management support is needed unless otherwise documented below in the visit note. 

## 2016-01-15 NOTE — Patient Instructions (Signed)
Look at the gout diet.  Go to the lab on the way out.  We'll contact you with your lab report. Restart pred taper with food, start colchicine if needed.  Don't take ibuprofen with prednisone.   Take care.  Glad to see you.

## 2016-01-15 NOTE — Progress Notes (Signed)
L 1st MTP pain.  Recently seen by another MD. He couldn't sleep from pain prev.  Presumed gout, started on prednisone.  In the meantime, he was getting some better.  Then this AM the pain was getting worse again.  Puffy and ttp at the L 1st MTP.  Pain with ROM.  Now with some L 2nd toe pain also.  No fevers, no chills.  He doesn't feel sick.  No trauma.  No known h/o gout.    He is quick taper of gout; he clearly got some better initially.   Meds, vitals, and allergies reviewed.   ROS: Per HPI unless specifically indicated in ROS section   No apparent distress. Walking with limp. Able to bear weight. Left foot with normal inspection except for puffiness and redness at the first MTP. Pain with range of motion first MTP. Foot is neurovascularly intact otherwise with normal sensation and normal dorsalis pedis pulse. Foot is not tender to palpation otherwise.

## 2016-01-16 DIAGNOSIS — Z119 Encounter for screening for infectious and parasitic diseases, unspecified: Secondary | ICD-10-CM | POA: Insufficient documentation

## 2016-01-16 DIAGNOSIS — M109 Gout, unspecified: Secondary | ICD-10-CM | POA: Insufficient documentation

## 2016-01-16 LAB — HEPATITIS C ANTIBODY: HCV Ab: NEGATIVE

## 2016-01-16 LAB — URIC ACID: Uric Acid, Serum: 6.3 mg/dL (ref 4.0–7.8)

## 2016-01-16 NOTE — Assessment & Plan Note (Signed)
He had opted in for hepatitis C screening. See notes on labs.

## 2016-01-16 NOTE — Assessment & Plan Note (Signed)
Presumed diagnosis. Likely. Discussed with patient about pathophysiology. It may be that he was getting better from the prednisone taper, but taper was not going on long enough. His foot does not look infected. Restart prednisone taper but extend the course. See med list. See after visit summary. Would not start prophylactic medication in the midst of an acute flare. D/w patient. He agrees. He can use colchicine if needed in the meantime. See notes on labs.

## 2016-04-02 ENCOUNTER — Encounter: Payer: Self-pay | Admitting: Family Medicine

## 2016-04-02 ENCOUNTER — Ambulatory Visit (INDEPENDENT_AMBULATORY_CARE_PROVIDER_SITE_OTHER): Payer: PRIVATE HEALTH INSURANCE | Admitting: Family Medicine

## 2016-04-02 VITALS — BP 124/74 | HR 76 | Temp 98.3°F | Wt 215.8 lb

## 2016-04-02 DIAGNOSIS — R0981 Nasal congestion: Secondary | ICD-10-CM

## 2016-04-02 DIAGNOSIS — Z23 Encounter for immunization: Secondary | ICD-10-CM

## 2016-04-02 MED ORDER — IPRATROPIUM BROMIDE 0.03 % NA SOLN: 2.0000 | Freq: Two times a day (BID) | NASAL | 12 refills | Status: DC

## 2016-04-02 NOTE — Progress Notes (Signed)
Pre visit review using our clinic review tool, if applicable. No additional management support is needed unless otherwise documented below in the visit note. 

## 2016-04-02 NOTE — Progress Notes (Signed)
Chronic sinus congestion.  Going on for a long time, "lifelong" per patient.  Congested, thick post nasal gtt.  Had tried claritin D, then allegra D.  Still on zyrtec and flonase currently.  He can't tolerate OTC decongestants at night as that affects his sleep too much.  He has tried mucinex at night w/o relief.    Uses CPAP for OSA at baseline.  That helped with AM fatigue at the time.  Now he is getting congested at night to the point where he can't use CPAP well, so he isn't sleeping as well as prev.  He has been snoring over the CPAP.    H/o nasal septum surgery in the past.  No FCNAVD.  He doesn't feel sick o/w.    Meds, vitals, and allergies reviewed.   ROS: Per HPI unless specifically indicated in ROS section   GEN: nad, alert and oriented HEENT: mucous membranes moist, TM wnl, nasal exam with some clear discharge, no obvious obstruction, OP wnl NECK: supple w/o LA, no stridor.  CV: rrr PULM: ctab, no inc wob

## 2016-04-02 NOTE — Patient Instructions (Signed)
Rosaria Ferries will call about your referral.  ENT clinic then allergy clinic.   Add on nasal atrovent.   Update me as needed.  Take care.  Glad to see you.

## 2016-04-03 DIAGNOSIS — R0981 Nasal congestion: Secondary | ICD-10-CM | POA: Insufficient documentation

## 2016-04-03 NOTE — Assessment & Plan Note (Signed)
Chronic issue. Does not appear infectious. Discussed with patient about options. Add on nasal Atrovent. In meantime set up follow-up with ENT. He may end up needing allergy clinic evaluation. I put in that referral also, to be done after ENT evaluation. He agrees. Appreciate help all involved.

## 2016-05-01 DIAGNOSIS — J343 Hypertrophy of nasal turbinates: Secondary | ICD-10-CM | POA: Insufficient documentation

## 2016-05-01 DIAGNOSIS — J324 Chronic pansinusitis: Secondary | ICD-10-CM | POA: Insufficient documentation

## 2016-05-20 ENCOUNTER — Ambulatory Visit (INDEPENDENT_AMBULATORY_CARE_PROVIDER_SITE_OTHER): Payer: PRIVATE HEALTH INSURANCE | Admitting: Allergy and Immunology

## 2016-05-20 ENCOUNTER — Encounter: Payer: Self-pay | Admitting: Allergy and Immunology

## 2016-05-20 VITALS — BP 120/74 | HR 80 | Temp 98.3°F | Resp 16 | Ht 68.5 in | Wt 218.6 lb

## 2016-05-20 DIAGNOSIS — J3089 Other allergic rhinitis: Secondary | ICD-10-CM

## 2016-05-20 DIAGNOSIS — L719 Rosacea, unspecified: Secondary | ICD-10-CM | POA: Diagnosis not present

## 2016-05-20 DIAGNOSIS — K219 Gastro-esophageal reflux disease without esophagitis: Secondary | ICD-10-CM

## 2016-05-20 MED ORDER — RANITIDINE HCL 300 MG PO TABS: ORAL_TABLET | ORAL | 5 refills | Status: DC

## 2016-05-20 MED ORDER — METRONIDAZOLE 0.75 % EX CREA: TOPICAL_CREAM | CUTANEOUS | 5 refills | Status: DC

## 2016-05-20 MED ORDER — OMEPRAZOLE 40 MG PO CPDR: DELAYED_RELEASE_CAPSULE | ORAL | 5 refills | Status: DC

## 2016-05-20 MED ORDER — AZELASTINE HCL 0.1 % NA SOLN: NASAL | 5 refills | Status: DC

## 2016-05-20 NOTE — Patient Instructions (Addendum)
  1. Allergen avoidance measures  2. Prior to bedtime utilize the following:   A. Afrin 1 spray alternating nostril every other night  B. Flonase 2 sprays each nostril  C. Azelastine 2 sprays each nostril   3. If needed:   A. nasal saline spray  B. OTC antihistamine - Claritin/Zyrtec  4. Treat reflux:   A. increase omeprazole to 40 mg one time per day in a.m.  B. start ranitidine 300 mg one time per day in p.m.  C. consolidate all caffeine consumption  5. Treat rosacea: Metro cream apply to face twice a day  6. Further evaluation and treatment?  7. Return to clinic in 4 weeks or earlier if problem

## 2016-05-20 NOTE — Progress Notes (Signed)
Dear Dr. Damita Dunnings,  Thank you for referring Joseph Drake to the [facility] on 05/20/2016.   Below is a summation of this patient's evaluation and recommendations.  Thank you for your referral. I will keep you informed about this patient's response to treatment.   If you have any questions please do not hesitate to contact me.   Sincerely,  Jiles Prows, MD [place] [facility] [place] [facility]   ______________________________________________________________________    NEW PATIENT NOTE  Referring Provider: Tonia Ghent, MD Primary Provider: Elsie Stain, MD Date of office visit: 05/20/2016    Subjective:   Chief Complaint:  Joseph Drake (DOB: 08/14/1960) is a 56 y.o. male who presents to the clinic on 05/20/2016 with a chief complaint of Nasal Congestion .     HPI: Joseph Drake presents to this clinic in evaluation of persistent nasal congestion. Apparently for decades Joseph Drake has been having problems with nasal congestion associated with sneezing. He uses a CPAP machine and over the course of the past several months this nasal congestion has really become a significant issue and appears to be interfering with the function of his nasal pillow. He's developed more snoring and he has once again developed fatigue in the morning secondary to his nasal congestion at nighttime. He did visit with ENT, Dr. Janace Hoard, who treated him empirically with clindamycin which did not really help much of his congestion but maybe cleared up some bloody nasal discharge that was present. He does use a nasal steroid and he thinks that this may help him somewhat yet he still continues to have problems. There is no obvious provoking factor giving rise to this issue.  Joseph Drake also has an issue with constant drainage in his throat. He feels as though something is running down his throat. He has a glob stuck in his throat and throat clearing and  intermittent raspy voice. He does have reflux adequately treated with omeprazole. If he does not use omeprazole he gets regurgitation. He drinks 2 sodas per day and 1 tea per day and no chocolate.  Past Medical History:  Diagnosis Date  . Acid reflux   . Allergy   . BPH (benign prostatic hypertrophy)   . Candida infection   . Dyspnea   . ED (erectile dysfunction)   . Genital herpes   . History of continuous positive airway pressure (CPAP) therapy   . HTN (hypertension)   . Hydronephrosis   . Hyperlipidemia   . Kidney stone on left side   . LVH (left ventricular hypertrophy)   . Obesity   . Palpitation    Holter in May 2014 with NSR, PVC, rare PACs  . Palpitation   . Sleep apnea   . Testosterone deficiency     Past Surgical History:  Procedure Laterality Date  . CIRCUMCISION    . NASAL SEPTUM SURGERY      Allergies as of 05/20/2016      Reactions   Niacin And Related Hives   Penicillins    rash   Rosuvastatin    REACTION: myalgia      Medication List      benazepril 10 MG tablet Commonly known as:  LOTENSIN TAKE 0.5 TABLETS (5 MG TOTAL) BY MOUTH DAILY.   cetirizine 10 MG tablet Commonly known as:  ZYRTEC Take 10 mg by mouth daily.   colchicine 0.6 MG tablet Take 1 tablet (0.6 mg total) by mouth daily as needed (  for gout).   finasteride 5 MG tablet Commonly known as:  PROSCAR TAKE 1 TABLET (5 MG TOTAL) BY MOUTH DAILY.   fluticasone 50 MCG/ACT nasal spray Commonly known as:  FLONASE Place 2 sprays into both nostrils daily.   omeprazole 10 MG capsule Commonly known as:  PRILOSEC Take 10 mg by mouth daily.   tamsulosin 0.4 MG Caps capsule Commonly known as:  FLOMAX TAKE 1 CAPSULE BY MOUTH ONCE DAILY   valACYclovir 500 MG tablet Commonly known as:  VALTREX TAKE 1 TABLET (500 MG TOTAL) BY MOUTH DAILY.   VYTORIN 10-20 MG tablet Generic drug:  ezetimibe-simvastatin TAKE 1 TABLET BY MOUTH DAILY.       Review of systems negative except as noted in  HPI / PMHx or noted below:  Review of Systems  Constitutional: Negative.   HENT: Negative.   Eyes: Negative.   Respiratory: Negative.   Cardiovascular: Negative.   Gastrointestinal: Negative.   Genitourinary: Negative.   Musculoskeletal: Negative.   Skin: Negative.   Neurological: Negative.   Endo/Heme/Allergies: Negative.   Psychiatric/Behavioral: Negative.     Family History  Problem Relation Age of Onset  . Alcohol abuse Mother   . Hyperlipidemia Mother   . Hypertension Mother   . Hypertension Father   . Hyperlipidemia Father   . Colon cancer Paternal Uncle   . Prostate cancer Neg Hx   . Kidney disease Neg Hx   . Allergic rhinitis Neg Hx   . Asthma Neg Hx     Social History   Social History  . Marital status: Married    Spouse name: N/A  . Number of children: N/A  . Years of education: N/A   Occupational History  . Not on file.   Social History Main Topics  . Smoking status: Never Smoker  . Smokeless tobacco: Never Used  . Alcohol use 0.0 oz/week     Comment: rarely   . Drug use: No  . Sexual activity: Yes   Other Topics Concern  . Not on file   Social History Narrative   Drafting, structural Chief Strategy Officer for bridges   Remarried 2002   1 daughter from 1st marriage   2 stepkids from wife's first marriage   Orthoptist and Social history  Lives in a house with a dry environment, no animals located inside the household, carpeting in the bedroom, no plastic on the bed or pillow, and no smokers located inside the household. He works as a Geneticist, molecular with Social worker  Objective:   Vitals:   05/20/16 1405  BP: 120/74  Pulse: 80  Resp: 16  Temp: 98.3 F (36.8 C)   Height: 5' 8.5" (174 cm) Weight: 218 lb 9.6 oz (99.2 kg)  Physical Exam  Constitutional: He is well-developed, well-nourished, and in no distress.  HENT:  Head: Normocephalic. Head is without right periorbital erythema and without left periorbital erythema.    Right Ear: Tympanic membrane, external ear and ear canal normal.  Left Ear: Tympanic membrane, external ear and ear canal normal.  Nose: Mucosal edema present. No rhinorrhea.  Mouth/Throat: Oropharynx is clear and moist and mucous membranes are normal. No oropharyngeal exudate.  Eyes: Conjunctivae and lids are normal. Pupils are equal, round, and reactive to light.  Neck: Trachea normal. No tracheal deviation present. No thyromegaly present.  Cardiovascular: Normal rate, regular rhythm, S1 normal, S2 normal and normal heart sounds.   No murmur heard. Pulmonary/Chest: Effort normal. No stridor. No tachypnea. No respiratory  distress. He has no wheezes. He has no rales. He exhibits no tenderness.  Abdominal: Soft. He exhibits no distension and no mass. There is no hepatosplenomegaly. There is no tenderness. There is no rebound and no guarding.  Musculoskeletal: He exhibits no edema or tenderness.  Lymphadenopathy:       Head (right side): No tonsillar adenopathy present.       Head (left side): No tonsillar adenopathy present.    He has no cervical adenopathy.    He has no axillary adenopathy.  Neurological: He is alert. Gait normal.  Skin: Rash ( erythema, telangiectasia, and papule formation on nose and cheeks and forehead ) noted. He is not diaphoretic. No erythema. No pallor. Nails show no clubbing.  Psychiatric: Mood and affect normal.    Diagnostics: Allergy skin tests were performed. He demonstrated hypersensitivity to dust mite, dog, and mold  Assessment and Plan:    1. Other allergic rhinitis   2. LPRD (laryngopharyngeal reflux disease)   3. Rosacea     1. Allergen avoidance measures  2. Prior to bedtime utilize the following:   A. Afrin 1 spray alternating nostril every other night  B. Flonase 2 sprays each nostril  C. Azelastine 2 sprays each nostril   3. If needed:   A. nasal saline spray  B. OTC antihistamine - Claritin/Zyrtec  4. Treat reflux:   A. increase  omeprazole to 40 mg one time per day in a.m.  B. start ranitidine 300 mg one time per day in p.m.  C. consolidate all caffeine consumption  5. Treat rosacea: Metro cream apply to face twice a day  6. Further evaluation and treatment?  7. Return to clinic in 4 weeks or earlier if problem  Joseph Drake will utilize the plan mentioned above which includes the administration of a topical nasal decongestant spray. I do not think we will get into any problems with rebound as long as he continues to use Flonase and his nasal antihistamine spray in conjunction with Afrin. We'll have him use Afrin as a single nostril administration alternating from night to night. In addition, he does have lots of postnasal drip and throat clearing suggesting that he does have LPR and we'll treat this empirically with the therapy mentioned above for the next 4 weeks to see what type of response we receive with this approach. Finally, he does have have rosacea and he does appear to be developing some cystic lesions with this condition and I will start him on some MetroCream. I'll regroup with him in 4 weeks.  Jiles Prows, MD [facility] [place]

## 2016-06-09 DIAGNOSIS — K219 Gastro-esophageal reflux disease without esophagitis: Secondary | ICD-10-CM | POA: Insufficient documentation

## 2016-06-17 ENCOUNTER — Ambulatory Visit: Payer: PRIVATE HEALTH INSURANCE | Admitting: Allergy and Immunology

## 2016-06-19 ENCOUNTER — Other Ambulatory Visit: Payer: Self-pay | Admitting: Family Medicine

## 2016-07-16 ENCOUNTER — Other Ambulatory Visit: Payer: Self-pay | Admitting: Family Medicine

## 2016-08-07 ENCOUNTER — Encounter: Payer: PRIVATE HEALTH INSURANCE | Admitting: Family Medicine

## 2016-08-08 ENCOUNTER — Encounter: Payer: Self-pay | Admitting: Family Medicine

## 2016-08-08 ENCOUNTER — Ambulatory Visit (INDEPENDENT_AMBULATORY_CARE_PROVIDER_SITE_OTHER): Payer: PRIVATE HEALTH INSURANCE | Admitting: Family Medicine

## 2016-08-08 VITALS — BP 102/80 | HR 80 | Temp 97.5°F | Wt 216.8 lb

## 2016-08-08 DIAGNOSIS — Z0001 Encounter for general adult medical examination with abnormal findings: Secondary | ICD-10-CM

## 2016-08-08 DIAGNOSIS — N401 Enlarged prostate with lower urinary tract symptoms: Secondary | ICD-10-CM

## 2016-08-08 DIAGNOSIS — K219 Gastro-esophageal reflux disease without esophagitis: Secondary | ICD-10-CM

## 2016-08-08 DIAGNOSIS — A6000 Herpesviral infection of urogenital system, unspecified: Secondary | ICD-10-CM

## 2016-08-08 DIAGNOSIS — L719 Rosacea, unspecified: Secondary | ICD-10-CM

## 2016-08-08 DIAGNOSIS — Z125 Encounter for screening for malignant neoplasm of prostate: Secondary | ICD-10-CM | POA: Diagnosis not present

## 2016-08-08 DIAGNOSIS — E785 Hyperlipidemia, unspecified: Secondary | ICD-10-CM

## 2016-08-08 DIAGNOSIS — N138 Other obstructive and reflux uropathy: Secondary | ICD-10-CM

## 2016-08-08 DIAGNOSIS — I10 Essential (primary) hypertension: Secondary | ICD-10-CM | POA: Diagnosis not present

## 2016-08-08 DIAGNOSIS — G4733 Obstructive sleep apnea (adult) (pediatric): Secondary | ICD-10-CM

## 2016-08-08 LAB — COMPREHENSIVE METABOLIC PANEL
ALT: 28 U/L (ref 0–53)
AST: 19 U/L (ref 0–37)
Albumin: 4.3 g/dL (ref 3.5–5.2)
Alkaline Phosphatase: 88 U/L (ref 39–117)
BUN: 12 mg/dL (ref 6–23)
CO2: 30 mEq/L (ref 19–32)
Calcium: 9.1 mg/dL (ref 8.4–10.5)
Chloride: 104 mEq/L (ref 96–112)
Creatinine, Ser: 1.06 mg/dL (ref 0.40–1.50)
GFR: 76.58 mL/min (ref >60.00)
Glucose, Bld: 100 mg/dL — ABNORMAL HIGH (ref 70–99)
Potassium: 4.3 mEq/L (ref 3.5–5.1)
Sodium: 140 mEq/L (ref 135–145)
Total Bilirubin: 0.7 mg/dL (ref 0.2–1.2)
Total Protein: 7 g/dL (ref 6.0–8.3)

## 2016-08-08 LAB — LIPID PANEL
Cholesterol: 151 mg/dL (ref 0–200)
HDL: 35.1 mg/dL — ABNORMAL LOW (ref >39.00)
LDL Cholesterol: 82 mg/dL (ref 0–99)
NonHDL: 116.39
Total CHOL/HDL Ratio: 4
Triglycerides: 172 mg/dL — ABNORMAL HIGH (ref 0.0–149.0)
VLDL: 34.4 mg/dL (ref 0.0–40.0)

## 2016-08-08 LAB — PSA: PSA: 0.19 ng/mL (ref 0.10–4.00)

## 2016-08-08 LAB — URIC ACID: Uric Acid, Serum: 7.8 mg/dL (ref 4.0–7.8)

## 2016-08-08 MED ORDER — FINASTERIDE 5 MG PO TABS: ORAL_TABLET | ORAL | 3 refills | Status: DC

## 2016-08-08 MED ORDER — BENAZEPRIL HCL 10 MG PO TABS: 5.0000 mg | ORAL_TABLET | Freq: Every day | ORAL | 3 refills | Status: DC

## 2016-08-08 MED ORDER — EZETIMIBE-SIMVASTATIN 10-20 MG PO TABS: 1.0000 | ORAL_TABLET | Freq: Every day | ORAL | 3 refills | Status: DC

## 2016-08-08 MED ORDER — VALACYCLOVIR HCL 500 MG PO TABS: ORAL_TABLET | ORAL | 3 refills | Status: DC

## 2016-08-08 MED ORDER — COLCHICINE 0.6 MG PO TABS: 0.6000 mg | ORAL_TABLET | Freq: Every day | ORAL | 0 refills | Status: DC | PRN

## 2016-08-08 NOTE — Progress Notes (Signed)
CPE- See plan.  Routine anticipatory guidance given to patient.  See health maintenance.  The possibility exists that previously documented standard health maintenance information may have been brought forward from a previous encounter into this note.  If needed, that same information has been updated to reflect the current situation based on today's encounter.    Flu 2017 Tetanus 2015 PNA and shingles not due.  Colonoscopy 2017 PSA pending.   Living will- wife designated if patient were incapacitated.   HCV pref nev HIV screening done, neg, in 1990s.   Diet and exercise, d/w pt.  "I cough do better."   Encouraged.    Hypertension:    Using medication without problems or lightheadedness: yes Chest pain with exertion:no Edema:no Short of breath:no  Elevated Cholesterol: Using medications without problems:yes Muscle aches: no Diet compliance: encouraged Exercise: encouraged Labs pending.   BPH.  Still on baseline meds.  Complaint.  He has urology f/u pending.    OSA on CPAP, compliant, with relief.  He notes fatigue and throat irritation if he doesn't use it.  He has seen ENT in the meantime.  He is considering surgery vs tolerating his current turbinate enlargement.  I'll defer to patient and ENT.    Still on valtrex suppression with relief.    On metrogel for rosacea, with some improvement.  No ADE on med except for mild stinging with med application, it resolves.  He can try application to another area to see if that causes the same sensation, d/w pt.    He didn't tolerate 40mg  of prilosec, had some back discomfort, backed down to 20mg  and did tolerate that.  He isn't have GERD sx as is.    PMH and SH reviewed  Meds, vitals, and allergies reviewed.   ROS: Per HPI.  Unless specifically indicated otherwise in HPI, the patient denies:  General: fever. Eyes: acute vision changes ENT: sore throat Cardiovascular: chest pain Respiratory: SOB GI: vomiting GU:  dysuria Musculoskeletal: acute back pain Derm: acute rash Neuro: acute motor dysfunction Psych: worsening mood Endocrine: polydipsia Heme: bleeding Allergy: hayfever  GEN: nad, alert and oriented HEENT: mucous membranes moist NECK: supple w/o LA CV: rrr. PULM: ctab, no inc wob ABD: soft, +bs EXT: no edema SKIN: no acute rash except mild rosacea noted on the face, benign cherry angiomas noted.

## 2016-08-08 NOTE — Progress Notes (Signed)
Pre visit review using our clinic review tool, if applicable. No additional management support is needed unless otherwise documented below in the visit note. 

## 2016-08-08 NOTE — Patient Instructions (Signed)
Take care.  Glad to see you.  Go to the lab on the way out.  We'll contact you with your lab report. Update me as needed.

## 2016-08-10 DIAGNOSIS — L719 Rosacea, unspecified: Secondary | ICD-10-CM | POA: Insufficient documentation

## 2016-08-10 DIAGNOSIS — K219 Gastro-esophageal reflux disease without esophagitis: Secondary | ICD-10-CM | POA: Insufficient documentation

## 2016-08-10 NOTE — Assessment & Plan Note (Signed)
On suppression without troubles. Continue as is.

## 2016-08-10 NOTE — Assessment & Plan Note (Signed)
Able to tolerate Prilosec 20 mg a day. Continue as is.

## 2016-08-10 NOTE — Assessment & Plan Note (Signed)
Able to tolerate statin. See notes on labs.

## 2016-08-10 NOTE — Assessment & Plan Note (Signed)
Flu 2017 Tetanus 2015 PNA and shingles not due.  Colonoscopy 2017 PSA pending.   Living will- wife designated if patient were incapacitated.   HCV pref nev HIV screening done, neg, in 1990s.   Diet and exercise, d/w pt.  "I cough do better."   Encouraged.

## 2016-08-10 NOTE — Assessment & Plan Note (Addendum)
Some improvement with med. No ADE on med except for mild stinging with med application, it resolves soon thereafter.  He can try application to another area to see if that causes the same sensation, d/w pt.   update me as needed. He agrees.

## 2016-08-10 NOTE — Assessment & Plan Note (Signed)
Still on baseline meds.  Complaint.  He has urology f/u pending.

## 2016-08-10 NOTE — Assessment & Plan Note (Signed)
Controlled. Continue work on diet and exercise. No change in meds.

## 2016-08-10 NOTE — Assessment & Plan Note (Signed)
He has seen ENT in the meantime.  He is considering surgery vs tolerating his current turbinate enlargement.  I'll defer to patient and ENT.

## 2016-09-16 ENCOUNTER — Ambulatory Visit (INDEPENDENT_AMBULATORY_CARE_PROVIDER_SITE_OTHER): Payer: PRIVATE HEALTH INSURANCE | Admitting: Family Medicine

## 2016-09-16 ENCOUNTER — Encounter: Payer: Self-pay | Admitting: Family Medicine

## 2016-09-16 VITALS — BP 122/78 | HR 83 | Temp 97.5°F | Wt 214.8 lb

## 2016-09-16 DIAGNOSIS — F419 Anxiety disorder, unspecified: Secondary | ICD-10-CM

## 2016-09-16 MED ORDER — BUPROPION HCL 75 MG PO TABS: ORAL_TABLET | ORAL | 3 refills | Status: DC

## 2016-09-16 NOTE — Patient Instructions (Signed)
Rosaria Ferries will call about your referral. See her on the way out.  Start wellbutrin, 1 tab a day initially.  Go up to 1 pill twice a day after about 5 days if needed/tolerated.  Update me in about 10 days, sooner if needed.  You deserve a lot of credit for coming in today.  Take care.  Glad to see you.

## 2016-09-16 NOTE — Progress Notes (Signed)
Anxiety and depression sx.  "I live my life with my fears, rational or irrational, in a tight little ball and normally I can keep them in but I can't now and I'm all over the place."  "I need some help."  No SI/HI.  No h/o SA.    The trigger this time was related to changes in his relationship with his wife, ie sexual changes- she is going through menopause and he has urology f/u pending.  "I have these fears that she might go somewhere else and I can't get the fears under control."    Focus is diminished.  Sleep is fair, but not great.    He has had flares like this prev, episodic.  Has been going on episodically for decades with anxiety attack at about 56 years of age, around the time of significant stressors.  He has memories of trouble speaking in public dating back to primary school.  He has usually tried to avoid conflicts.    Meds, vitals, and allergies reviewed.   ROS: Per HPI unless specifically indicated in ROS section   GEN: nad, alert and oriented HEENT: mucous membranes moist NECK: supple w/o LA CV: rrr.  no murmur PULM: ctab, no inc wob

## 2016-09-17 DIAGNOSIS — F419 Anxiety disorder, unspecified: Secondary | ICD-10-CM | POA: Insufficient documentation

## 2016-09-17 NOTE — Assessment & Plan Note (Addendum)
Discussed with patient about diagnosis and treatment. Refer for counseling. Okay for outpatient follow-up. He was interested in starting medications. Would avoid SSRIs due to potential for sexual side effects.  Would start Wellbutrin 75 mg a day. Increase to twice a day if tolerated, after about 5 days. No history of seizure. Routine cautions given. He will update me. He agrees with plan. >25 minutes spent in face to face time with patient, >50% spent in counselling or coordination of care.

## 2016-09-25 ENCOUNTER — Telehealth: Payer: Self-pay | Admitting: Urology

## 2016-09-25 ENCOUNTER — Other Ambulatory Visit: Payer: Self-pay

## 2016-09-25 DIAGNOSIS — N401 Enlarged prostate with lower urinary tract symptoms: Secondary | ICD-10-CM

## 2016-09-25 NOTE — Telephone Encounter (Signed)
Pt had PSA done by primary care in April/2018.  He wondered if he needed it done again.

## 2016-09-25 NOTE — Telephone Encounter (Signed)
No. We do not need to repeat his PSA.

## 2016-09-26 NOTE — Telephone Encounter (Signed)
Spoke to patient gave instructions. Patient verbalized understanding.

## 2016-10-01 ENCOUNTER — Other Ambulatory Visit: Payer: PRIVATE HEALTH INSURANCE

## 2016-10-01 ENCOUNTER — Encounter: Payer: Self-pay | Admitting: Family Medicine

## 2016-10-02 ENCOUNTER — Ambulatory Visit (INDEPENDENT_AMBULATORY_CARE_PROVIDER_SITE_OTHER): Payer: PRIVATE HEALTH INSURANCE | Admitting: Psychology

## 2016-10-02 ENCOUNTER — Other Ambulatory Visit: Payer: Self-pay | Admitting: Family Medicine

## 2016-10-02 DIAGNOSIS — F411 Generalized anxiety disorder: Secondary | ICD-10-CM

## 2016-10-03 NOTE — Telephone Encounter (Signed)
Electronic refill request. Last office visit:   08/08/16 CPE Last Filled:   30 tablet 0 08/08/2016  Please advise.

## 2016-10-04 NOTE — Telephone Encounter (Signed)
Sent. Thanks.   

## 2016-10-08 ENCOUNTER — Ambulatory Visit: Payer: PRIVATE HEALTH INSURANCE | Admitting: Urology

## 2016-10-13 ENCOUNTER — Ambulatory Visit: Payer: PRIVATE HEALTH INSURANCE | Admitting: Urology

## 2016-10-17 ENCOUNTER — Ambulatory Visit (INDEPENDENT_AMBULATORY_CARE_PROVIDER_SITE_OTHER): Payer: PRIVATE HEALTH INSURANCE | Admitting: Psychology

## 2016-10-17 DIAGNOSIS — F411 Generalized anxiety disorder: Secondary | ICD-10-CM

## 2016-10-19 NOTE — Progress Notes (Signed)
4:30 PM   Joseph Drake 1960/09/12 [id number]  Referring provider: Tonia Ghent, MD [address] [place], [place] [address]  Chief Complaint  Patient presents with  . Benign Prostatic Hypertrophy    1 year follow up  . Erectile Dysfunction    HPI: Patient is a 56 year old Caucasian male who presents today for a one year follow up for BPH with LU TS,  ED and ejaculatory disorders.    Background history Joseph Drake was having some significant ejaculatory disorder and concerning decrease in erectile function that has been getting progressively worse since he has been on the tamsulosin and finasteride.  He states he is experiencing a reduction of the volume of the semen and retrograde ejaculation since being on the medication.  He has also noted that his libido has decreased significantly.  His orgasms are becoming weaker and he is suffering from premature ejaculation.  We had a discontinue the tamsulosin and start Cialis 5 mg daily to see if he would have improvement with his premature ejaculation and perhaps increase the volume of his ejaculate fluid.  He stated he could not tolerate the Cialis due to it causing all over body aches.  He did note a little more volume in the semen, but it was not significant enough to continue the Cialis.  BPH WITH LUTS His IPSS score today is 10, which is moderate lower urinary tract symptomatology.  He is mostly satisfied with his quality life due to his urinary symptoms.  His previous I PSS score was 8/3.   His major complaint today are intermittency and hesitancy.  He has had these symptoms for over two years.  He denies any dysuria, hematuria or suprapubic pain.   He also denies any recent fevers, chills, nausea or vomiting.   He does not have a family history of PCa.    He is currently taken tansulosin and finasteride.        IPSS    Row Name 10/20/16 1600         International Prostate Symptom Score   How often have you had the  sensation of not emptying your bladder? Less than 1 in 5     How often have you had to urinate less than every two hours? Less than half the time     How often have you found you stopped and started again several times when you urinated? Less than half the time     How often have you found it difficult to postpone urination? Less than 1 in 5 times     How often have you had a weak urinary stream? About half the time     How often have you had to strain to start urination? Not at All     How many times did you typically get up at night to urinate? 1 Time     Total IPSS Score 10       Quality of Life due to urinary symptoms   If you were to spend the rest of your life with your urinary condition just the way it is now how would you feel about that? Mostly Satisfied        Score:  1-7 Mild 8-19 Moderate 20-35 Severe  Erectile dysfunction His SHIM score is 17, which is mild ED.   He has been having difficulty with erections for the last few years.   His major complaint is achieving an erection.  His libido is preserved  His risk factors for ED are age, BPH, testosterone deficiency, HTN, HLD, sleep apnea,  Stress and antidepressants.   He denies any painful erections or curvatures with his erections.   He is still having/no longer having spontaneous erections.  He has tried Cialis in the past, but he found it intolerable.       SHIM    Row Name 10/20/16 1612         SHIM: Over the last 6 months:   How do you rate your confidence that you could get and keep an erection? Moderate     When you had erections with sexual stimulation, how often were your erections hard enough for penetration (entering your partner)? Sometimes (about half the time)     During sexual intercourse, how often were you able to maintain your erection after you had penetrated (entered) your partner? Most Times (much more than half the time)     During sexual intercourse, how difficult was it to maintain your erection  to completion of intercourse? Slightly Difficult     When you attempted sexual intercourse, how often was it satisfactory for you? Sometimes (about half the time)       SHIM Total Score   SHIM 17        Score: 1-7 Severe ED 8-11 Moderate ED 12-16 Mild-Moderate ED 17-21 Mild ED 22-25 No ED  PMH: Past Medical History:  Diagnosis Date  . Acid reflux   . Allergy   . BPH (benign prostatic hypertrophy)   . Candida infection   . Dyspnea   . ED (erectile dysfunction)   . Genital herpes   . History of continuous positive airway pressure (CPAP) therapy   . HTN (hypertension)   . Hydronephrosis   . Hyperlipidemia   . Kidney stone on left side   . LVH (left ventricular hypertrophy)   . Obesity   . Palpitation    Holter in May 2014 with NSR, PVC, rare PACs  . Palpitation   . Sleep apnea   . Testosterone deficiency     Surgical History: Past Surgical History:  Procedure Laterality Date  . CIRCUMCISION    . NASAL SEPTUM SURGERY      Home Medications:  Allergies as of 10/20/2016      Reactions   Niacin And Related Hives   Penicillins    rash   Rosuvastatin    REACTION: myalgia      Medication List       Accurate as of 10/20/16  4:30 PM. Always use your most recent med list.          benazepril 10 MG tablet Commonly known as:  LOTENSIN Take 0.5 tablets (5 mg total) by mouth daily.   buPROPion 75 MG tablet Commonly known as:  WELLBUTRIN 1 tab a day for 5 days, then 1 tab twice a day.   cetirizine 10 MG tablet Commonly known as:  ZYRTEC Take 10 mg by mouth daily.   colchicine 0.6 MG tablet TAKE ONE TABLET BY MOUTH DAILY AS NEEDED FOR GOUT   ezetimibe-simvastatin 10-20 MG tablet Commonly known as:  VYTORIN Take 1 tablet by mouth daily.   finasteride 5 MG tablet Commonly known as:  PROSCAR TAKE 1 TABLET (5 MG TOTAL) BY MOUTH DAILY.   fluticasone 50 MCG/ACT nasal spray Commonly known as:  FLONASE Place 2 sprays into both nostrils daily.     metroNIDAZOLE 0.75 % cream Commonly known as:  METROCREAM Apply to face twice daily as directed  omeprazole 20 MG capsule Commonly known as:  PRILOSEC Take 20 mg by mouth daily.   tamsulosin 0.4 MG Caps capsule Commonly known as:  FLOMAX TAKE 1 CAPSULE BY MOUTH ONCE DAILY   valACYclovir 500 MG tablet Commonly known as:  VALTREX TAKE 1 TABLET (500 MG TOTAL) BY MOUTH DAILY.       Allergies:  Allergies  Allergen Reactions  . Niacin And Related Hives  . Penicillins     rash  . Rosuvastatin     REACTION: myalgia    Family History: Family History  Problem Relation Age of Onset  . Alcohol abuse Mother   . Hyperlipidemia Mother   . Hypertension Mother   . Hypertension Father   . Hyperlipidemia Father   . Colon cancer Paternal Uncle   . Prostate cancer Neg Hx   . Kidney disease Neg Hx   . Allergic rhinitis Neg Hx   . Asthma Neg Hx   . Kidney cancer Neg Hx   . Bladder Cancer Neg Hx     Social History:  reports that he has never smoked. He has never used smokeless tobacco. He reports that he drinks alcohol. He reports that he does not use drugs.  ROS: UROLOGY Frequent Urination?: No Hard to postpone urination?: No Burning/pain with urination?: No Get up at night to urinate?: No Leakage of urine?: No Urine stream starts and stops?: Yes Trouble starting stream?: Yes Do you have to strain to urinate?: No Blood in urine?: No Urinary tract infection?: No Sexually transmitted disease?: No Injury to kidneys or bladder?: No Painful intercourse?: No Weak stream?: No Erection problems?: No Penile pain?: No  Gastrointestinal Nausea?: No Vomiting?: No Indigestion/heartburn?: No Diarrhea?: No Constipation?: No  Constitutional Fever: No Night sweats?: No Weight loss?: No Fatigue?: No  Skin Skin rash/lesions?: No Itching?: No  Eyes Blurred vision?: No Double vision?: No  Ears/Nose/Throat Sore throat?: No Sinus problems?:  No  Hematologic/Lymphatic Swollen glands?: No Easy bruising?: No  Cardiovascular Leg swelling?: No Chest pain?: No  Respiratory Cough?: No Shortness of breath?: No  Endocrine Excessive thirst?: No  Musculoskeletal Back pain?: No Joint pain?: No  Neurological Headaches?: No Dizziness?: No  Psychologic Depression?: No Anxiety?: No  Physical Exam: BP 117/81   Pulse 85   Ht 5\' 9"  (1.753 m)   Wt 215 lb 11.2 oz (97.8 kg)   BMI 31.85 kg/m   Constitutional: Well nourished. Alert and oriented, No acute distress. HEENT: [place] AT, moist mucus membranes. Trachea midline, no masses. Cardiovascular: No clubbing, cyanosis, or edema. Respiratory: Normal respiratory effort, no increased work of breathing. GI: Abdomen is soft, non tender, non distended, no abdominal masses. Liver and spleen not palpable.  No hernias appreciated.  Stool sample for occult testing is not indicated.   GU: No CVA tenderness.  No bladder fullness or masses.  Patient with circumcised phallus.   Urethral meatus is patent.  No penile discharge. No penile lesions or rashes. Scrotum without lesions, cysts, rashes and/or edema.  Testicles are located scrotally bilaterally. No masses are appreciated in the testicles. Left and right epididymis are normal. Rectal: Patient with  normal sphincter tone. Anus and perineum without scarring or rashes. No rectal masses are appreciated. Prostate is approximately 45grams, deep median sulcus,  no nodules are appreciated. Seminal vesicles are normal. Skin: No rashes, bruises or suspicious lesions. Lymph: No cervical or inguinal adenopathy. Neurologic: Grossly intact, no focal deficits, moving all 4 extremities. Psychiatric: Normal mood and affect.  Laboratory Data:  Lab  Results  Component Value Date   CREATININE 1.06 08/08/2016    Lab Results  Component Value Date   PSA 0.19 08/08/2016   PSA 0.17 07/03/2015   PSA 0.17 01/06/2014        Component Value Date/Time    CHOL 151 08/08/2016 1043   HDL 35.10 (L) 08/08/2016 1043   CHOLHDL 4 08/08/2016 1043   VLDL 34.4 08/08/2016 1043   LDLCALC 82 08/08/2016 1043    Lab Results  Component Value Date   AST 19 08/08/2016   Lab Results  Component Value Date   ALT 28 08/08/2016     Assessment & Plan:    1. BPH with LUTS:   Patient's IPSS score is 10/2.  It is stable.  He Will continue the tamsulosin and finasteride.  We will see him in one year for I PSS score, PSA and exam.  2. Erectile dysfunction:    SHIM score is 17.  Patient did not experience much change with his erections with the Cialis 5 mg daily.  He stated the erections are satisfactory, its the premature ejaculation, reduction in semen volume in the less forceful orgasms that are bothersome to him.  H is seeing a therapist at this time.  We did discuss trying another PDE 5 inhibitor or intracavernosal injections. He deferred at this time. We'll see him in one year for a SHIM score and exam.  3. Ejaculatory disorder  - currently seeing a therapist  Return in about 1 year (around 10/20/2017) for IPSS, SHIM, PSA and exam.  These notes generated with voice recognition software. I apologize for typographical errors.  Zara Council, [facility] [address] [place], [place] [address] [phone]

## 2016-10-20 ENCOUNTER — Ambulatory Visit (INDEPENDENT_AMBULATORY_CARE_PROVIDER_SITE_OTHER): Payer: PRIVATE HEALTH INSURANCE | Admitting: Urology

## 2016-10-20 ENCOUNTER — Encounter: Payer: Self-pay | Admitting: Urology

## 2016-10-20 VITALS — BP 117/81 | HR 85 | Ht 69.0 in | Wt 215.7 lb

## 2016-10-20 DIAGNOSIS — N529 Male erectile dysfunction, unspecified: Secondary | ICD-10-CM | POA: Diagnosis not present

## 2016-10-20 DIAGNOSIS — N138 Other obstructive and reflux uropathy: Secondary | ICD-10-CM

## 2016-10-20 DIAGNOSIS — N5319 Other ejaculatory dysfunction: Secondary | ICD-10-CM | POA: Diagnosis not present

## 2016-10-20 DIAGNOSIS — N401 Enlarged prostate with lower urinary tract symptoms: Secondary | ICD-10-CM

## 2016-10-20 MED ORDER — FINASTERIDE 5 MG PO TABS: ORAL_TABLET | ORAL | 3 refills | Status: DC

## 2016-10-20 MED ORDER — TAMSULOSIN HCL 0.4 MG PO CAPS: 0.4000 mg | ORAL_CAPSULE | Freq: Every day | ORAL | 3 refills | Status: DC

## 2016-10-30 ENCOUNTER — Ambulatory Visit (INDEPENDENT_AMBULATORY_CARE_PROVIDER_SITE_OTHER): Payer: PRIVATE HEALTH INSURANCE | Admitting: Psychology

## 2016-10-30 DIAGNOSIS — F411 Generalized anxiety disorder: Secondary | ICD-10-CM

## 2016-11-13 ENCOUNTER — Encounter: Payer: Self-pay | Admitting: Urology

## 2016-11-13 ENCOUNTER — Ambulatory Visit (INDEPENDENT_AMBULATORY_CARE_PROVIDER_SITE_OTHER): Payer: PRIVATE HEALTH INSURANCE | Admitting: Psychology

## 2016-11-13 DIAGNOSIS — F411 Generalized anxiety disorder: Secondary | ICD-10-CM

## 2016-11-27 ENCOUNTER — Ambulatory Visit: Payer: PRIVATE HEALTH INSURANCE | Admitting: Psychology

## 2016-12-11 ENCOUNTER — Ambulatory Visit (INDEPENDENT_AMBULATORY_CARE_PROVIDER_SITE_OTHER): Payer: PRIVATE HEALTH INSURANCE | Admitting: Psychology

## 2016-12-11 DIAGNOSIS — F411 Generalized anxiety disorder: Secondary | ICD-10-CM

## 2016-12-26 ENCOUNTER — Encounter: Payer: Self-pay | Admitting: Family Medicine

## 2016-12-26 ENCOUNTER — Ambulatory Visit (INDEPENDENT_AMBULATORY_CARE_PROVIDER_SITE_OTHER): Payer: PRIVATE HEALTH INSURANCE | Admitting: Psychology

## 2016-12-26 DIAGNOSIS — F411 Generalized anxiety disorder: Secondary | ICD-10-CM

## 2016-12-30 ENCOUNTER — Other Ambulatory Visit: Payer: Self-pay | Admitting: Family Medicine

## 2016-12-30 MED ORDER — BUPROPION HCL 75 MG PO TABS: ORAL_TABLET | ORAL | 3 refills | Status: DC

## 2017-01-06 ENCOUNTER — Ambulatory Visit (INDEPENDENT_AMBULATORY_CARE_PROVIDER_SITE_OTHER): Payer: PRIVATE HEALTH INSURANCE | Admitting: Psychology

## 2017-01-06 DIAGNOSIS — F411 Generalized anxiety disorder: Secondary | ICD-10-CM | POA: Diagnosis not present

## 2017-01-26 ENCOUNTER — Ambulatory Visit (INDEPENDENT_AMBULATORY_CARE_PROVIDER_SITE_OTHER): Payer: PRIVATE HEALTH INSURANCE | Admitting: Psychology

## 2017-01-26 DIAGNOSIS — F411 Generalized anxiety disorder: Secondary | ICD-10-CM | POA: Diagnosis not present

## 2017-02-19 ENCOUNTER — Ambulatory Visit (INDEPENDENT_AMBULATORY_CARE_PROVIDER_SITE_OTHER): Payer: PRIVATE HEALTH INSURANCE | Admitting: Psychology

## 2017-02-19 DIAGNOSIS — F411 Generalized anxiety disorder: Secondary | ICD-10-CM

## 2017-03-13 ENCOUNTER — Ambulatory Visit (INDEPENDENT_AMBULATORY_CARE_PROVIDER_SITE_OTHER): Payer: PRIVATE HEALTH INSURANCE | Admitting: Psychology

## 2017-03-13 DIAGNOSIS — F411 Generalized anxiety disorder: Secondary | ICD-10-CM | POA: Diagnosis not present

## 2017-03-31 ENCOUNTER — Ambulatory Visit: Payer: PRIVATE HEALTH INSURANCE | Admitting: Psychology

## 2017-03-31 DIAGNOSIS — F411 Generalized anxiety disorder: Secondary | ICD-10-CM

## 2017-04-23 ENCOUNTER — Other Ambulatory Visit: Payer: Self-pay | Admitting: Family Medicine

## 2017-04-24 NOTE — Telephone Encounter (Signed)
Electronic refill request. Bupropion Last office visit:   09/16/2016 Last Filled:    90 tablet 3 12/30/2016  Please advise.

## 2017-04-26 NOTE — Telephone Encounter (Signed)
Sent.  Thanks.   Due for CPE in spring 2019.

## 2017-04-27 NOTE — Telephone Encounter (Signed)
Left detailed message on voicemail.  

## 2017-04-30 ENCOUNTER — Ambulatory Visit (INDEPENDENT_AMBULATORY_CARE_PROVIDER_SITE_OTHER): Payer: BLUE CROSS/BLUE SHIELD | Admitting: Psychology

## 2017-04-30 DIAGNOSIS — F411 Generalized anxiety disorder: Secondary | ICD-10-CM

## 2017-05-12 ENCOUNTER — Ambulatory Visit: Payer: BLUE CROSS/BLUE SHIELD | Admitting: Psychology

## 2017-05-12 DIAGNOSIS — F411 Generalized anxiety disorder: Secondary | ICD-10-CM | POA: Diagnosis not present

## 2017-05-21 ENCOUNTER — Ambulatory Visit (INDEPENDENT_AMBULATORY_CARE_PROVIDER_SITE_OTHER): Payer: BLUE CROSS/BLUE SHIELD | Admitting: Psychology

## 2017-05-21 DIAGNOSIS — F411 Generalized anxiety disorder: Secondary | ICD-10-CM | POA: Diagnosis not present

## 2017-06-08 ENCOUNTER — Ambulatory Visit: Payer: BLUE CROSS/BLUE SHIELD | Admitting: Psychology

## 2017-06-08 DIAGNOSIS — F411 Generalized anxiety disorder: Secondary | ICD-10-CM | POA: Diagnosis not present

## 2017-06-29 ENCOUNTER — Ambulatory Visit: Payer: BLUE CROSS/BLUE SHIELD | Admitting: Psychology

## 2017-06-29 DIAGNOSIS — F411 Generalized anxiety disorder: Secondary | ICD-10-CM

## 2017-07-20 ENCOUNTER — Ambulatory Visit: Payer: BLUE CROSS/BLUE SHIELD | Admitting: Psychology

## 2017-07-20 ENCOUNTER — Encounter: Payer: Self-pay | Admitting: Family Medicine

## 2017-07-20 ENCOUNTER — Encounter: Payer: Self-pay | Admitting: Urology

## 2017-07-20 ENCOUNTER — Other Ambulatory Visit: Payer: Self-pay | Admitting: *Deleted

## 2017-07-20 DIAGNOSIS — F411 Generalized anxiety disorder: Secondary | ICD-10-CM | POA: Diagnosis not present

## 2017-07-20 MED ORDER — BENAZEPRIL HCL 10 MG PO TABS: 5.0000 mg | ORAL_TABLET | Freq: Every day | ORAL | 0 refills | Status: DC

## 2017-07-20 MED ORDER — VALACYCLOVIR HCL 500 MG PO TABS: ORAL_TABLET | ORAL | 0 refills | Status: DC

## 2017-07-20 MED ORDER — EZETIMIBE-SIMVASTATIN 10-20 MG PO TABS: 1.0000 | ORAL_TABLET | Freq: Every day | ORAL | 0 refills | Status: DC

## 2017-07-21 ENCOUNTER — Other Ambulatory Visit: Payer: Self-pay | Admitting: Family Medicine

## 2017-07-21 MED ORDER — FINASTERIDE 5 MG PO TABS
ORAL_TABLET | ORAL | 3 refills | Status: DC
Start: 2017-07-21 — End: 2017-10-19

## 2017-07-21 MED ORDER — TAMSULOSIN HCL 0.4 MG PO CAPS: 0.4000 mg | ORAL_CAPSULE | Freq: Every day | ORAL | 3 refills | Status: DC

## 2017-08-10 ENCOUNTER — Encounter: Payer: Self-pay | Admitting: Family Medicine

## 2017-08-10 ENCOUNTER — Ambulatory Visit (INDEPENDENT_AMBULATORY_CARE_PROVIDER_SITE_OTHER): Payer: BLUE CROSS/BLUE SHIELD | Admitting: Family Medicine

## 2017-08-10 VITALS — BP 102/72 | HR 91 | Temp 98.3°F | Ht 69.0 in | Wt 215.5 lb

## 2017-08-10 DIAGNOSIS — M109 Gout, unspecified: Secondary | ICD-10-CM

## 2017-08-10 DIAGNOSIS — Z7189 Other specified counseling: Secondary | ICD-10-CM

## 2017-08-10 DIAGNOSIS — G4733 Obstructive sleep apnea (adult) (pediatric): Secondary | ICD-10-CM

## 2017-08-10 DIAGNOSIS — Z0001 Encounter for general adult medical examination with abnormal findings: Secondary | ICD-10-CM

## 2017-08-10 DIAGNOSIS — Z125 Encounter for screening for malignant neoplasm of prostate: Secondary | ICD-10-CM

## 2017-08-10 DIAGNOSIS — L989 Disorder of the skin and subcutaneous tissue, unspecified: Secondary | ICD-10-CM

## 2017-08-10 DIAGNOSIS — M79672 Pain in left foot: Secondary | ICD-10-CM

## 2017-08-10 DIAGNOSIS — N138 Other obstructive and reflux uropathy: Secondary | ICD-10-CM

## 2017-08-10 DIAGNOSIS — F419 Anxiety disorder, unspecified: Secondary | ICD-10-CM

## 2017-08-10 DIAGNOSIS — I10 Essential (primary) hypertension: Secondary | ICD-10-CM

## 2017-08-10 DIAGNOSIS — L608 Other nail disorders: Secondary | ICD-10-CM

## 2017-08-10 DIAGNOSIS — E785 Hyperlipidemia, unspecified: Secondary | ICD-10-CM

## 2017-08-10 DIAGNOSIS — N401 Enlarged prostate with lower urinary tract symptoms: Secondary | ICD-10-CM

## 2017-08-10 MED ORDER — COLCHICINE 0.6 MG PO TABS: ORAL_TABLET | ORAL | 2 refills | Status: DC

## 2017-08-10 MED ORDER — VALACYCLOVIR HCL 500 MG PO TABS: ORAL_TABLET | ORAL | 3 refills | Status: DC

## 2017-08-10 MED ORDER — BUPROPION HCL 75 MG PO TABS: ORAL_TABLET | ORAL | 3 refills | Status: DC

## 2017-08-10 MED ORDER — BENAZEPRIL HCL 5 MG PO TABS: 5.0000 mg | ORAL_TABLET | Freq: Every day | ORAL | 3 refills | Status: DC

## 2017-08-10 MED ORDER — EZETIMIBE-SIMVASTATIN 10-20 MG PO TABS: 1.0000 | ORAL_TABLET | Freq: Every day | ORAL | 3 refills | Status: DC

## 2017-08-10 NOTE — Patient Instructions (Addendum)
Check with the pharmacy about mitigare vs colcrys.   Go to the lab on the way out.  We'll contact you with your lab report. Try full length soft arch support inserts.  See how that does.   Update me as needed.   Try dial soap and warm compresses.  Update me if the spots don't heal over.   Thanks for your effort.  Take care.  Glad to see you.

## 2017-08-10 NOTE — Progress Notes (Signed)
CPE- See plan.  Routine anticipatory guidance given to patient.  See health maintenance.  The possibility exists that previously documented standard health maintenance information may have been brought forward from a previous encounter into this note.  If needed, that same information has been updated to reflect the current situation based on today's encounter.    Flu done yearly per patient report, at work.   Tetanus 2015 PNA and shingles not due.  Colonoscopy 2017 PSA pending.  on finasteride.   Living will- wife designated if patient were incapacitated.  HCV preg nev HIV screening done, neg, in 1990s.  Diet and exercise, d/w pt. walking some, discussed. Encouraged diet and exercise.  OSA on CPAP.  Compliant.  Feels better with use.    BPH.  He has f/u with urology pending and I'll defer.  Effect of med has been limited recently.  He agrees to talk to urology.    Gout. Rare/prn use of colchicine. D/w pt about options.  See AVS.    Elevated Cholesterol: Using medications without problems:yes Muscle aches: no Diet compliance:encouarged.   Exercise:encouarged Labs pending.   Hypertension:    Using medication without problems or lightheadedness: yes Chest pain with exertion:no Edema:no Short of breath:no Labs pending.    Mood d/w pt.  In counseling with some help.  Taking wellbutrin at baseline.  "some good day and some bad days" but better than last year. He thought med helped.  No ADE on med.  No SI/HI.    He has some occ bumps that came up in the last 6 months.  Some have resolved.  A few still present.  Initially itchy but not now.  Can sometime have postinflammatory changes.    Toenail fungus.  Has tried OTC meds. Single nail affected.  He wanted eval.    He has some dorsal L foot pain after walking.  No trauma.     PMH and SH reviewed  Meds, vitals, and allergies reviewed.   ROS: Per HPI.  Unless specifically indicated otherwise in HPI, the patient  denies:  General: fever. Eyes: acute vision changes ENT: sore throat Cardiovascular: chest pain Respiratory: SOB GI: vomiting GU: dysuria Musculoskeletal: acute back pain Derm: acute rash Neuro: acute motor dysfunction Psych: worsening mood Endocrine: polydipsia Heme: bleeding Allergy: hayfever  GEN: nad, alert and oriented HEENT: mucous membranes moist NECK: supple w/o LA CV: rrr. PULM: ctab, no inc wob ABD: soft, +bs EXT: no edema SKIN: no acute rash but he has a small irritated area near the right axilla, about 1 cm across.  No drainage.  No fluctuant mass.  It looks to be a resolving superficial skin infection. He has loss of arch in the L foot, w/o medial or lateral malleoli tenderness.  Chronic limited changes on R 1st toenail laterally

## 2017-08-11 DIAGNOSIS — L608 Other nail disorders: Secondary | ICD-10-CM | POA: Insufficient documentation

## 2017-08-11 DIAGNOSIS — L989 Disorder of the skin and subcutaneous tissue, unspecified: Secondary | ICD-10-CM | POA: Insufficient documentation

## 2017-08-11 DIAGNOSIS — R21 Rash and other nonspecific skin eruption: Secondary | ICD-10-CM | POA: Insufficient documentation

## 2017-08-11 LAB — COMPREHENSIVE METABOLIC PANEL
ALT: 27 U/L (ref 0–53)
AST: 17 U/L (ref 0–37)
Albumin: 4.4 g/dL (ref 3.5–5.2)
Alkaline Phosphatase: 100 U/L (ref 39–117)
BUN: 14 mg/dL (ref 6–23)
CO2: 30 mEq/L (ref 19–32)
Calcium: 9.3 mg/dL (ref 8.4–10.5)
Chloride: 100 mEq/L (ref 96–112)
Creatinine, Ser: 1.17 mg/dL (ref 0.40–1.50)
GFR: 68.09 mL/min (ref >60.00)
Glucose, Bld: 69 mg/dL — ABNORMAL LOW (ref 70–99)
Potassium: 4.1 mEq/L (ref 3.5–5.1)
Sodium: 137 mEq/L (ref 135–145)
Total Bilirubin: 0.7 mg/dL (ref 0.2–1.2)
Total Protein: 7.2 g/dL (ref 6.0–8.3)

## 2017-08-11 LAB — URIC ACID: Uric Acid, Serum: 8.1 mg/dL — ABNORMAL HIGH (ref 4.0–7.8)

## 2017-08-11 LAB — LIPID PANEL
Cholesterol: 159 mg/dL (ref 0–200)
HDL: 35.1 mg/dL — ABNORMAL LOW (ref >39.00)
NonHDL: 123.6
Total CHOL/HDL Ratio: 5
Triglycerides: 205 mg/dL — ABNORMAL HIGH (ref 0.0–149.0)
VLDL: 41 mg/dL — ABNORMAL HIGH (ref 0.0–40.0)

## 2017-08-11 LAB — PSA: PSA: 0.15 ng/mL (ref 0.10–4.00)

## 2017-08-11 LAB — LDL CHOLESTEROL, DIRECT: Direct LDL: 103 mg/dL

## 2017-08-11 NOTE — Assessment & Plan Note (Signed)
This could be an isolated issue of onychomycosis or it could just be an abnormal portion of the nail growing forward.  Either way with such a small area affected I would not try to treat it.  Discussed with patient.  He agrees.

## 2017-08-11 NOTE — Assessment & Plan Note (Signed)
Rare/prn use of colchicine. D/w pt about options.  See AVS.

## 2017-08-11 NOTE — Assessment & Plan Note (Signed)
Labs pending.  No change in meds.  See notes on labs.  Continue work on diet and exercise.

## 2017-08-11 NOTE — Assessment & Plan Note (Signed)
In counseling with some help.  Taking wellbutrin at baseline.  "some good day and some bad days" but better than last year. He thought med helped.  No ADE on med.  No SI/HI.   Continue as is.  He agrees.

## 2017-08-11 NOTE — Assessment & Plan Note (Signed)
Flu done yearly per patient report, at work.   Tetanus 2015 PNA and shingles not due.  Colonoscopy 2017 PSA pending.  on finasteride.    Living will- wife designated if patient were incapacitated.  HCV preg nev HIV screening done, neg, in 1990s.  Diet and exercise, d/w pt. walking some, discussed. Encouraged diet and exercise.

## 2017-08-11 NOTE — Assessment & Plan Note (Signed)
Compliant.  Feels better with use.

## 2017-08-11 NOTE — Assessment & Plan Note (Signed)
He has f/u with urology pending and I'll defer.  Effect of med has been limited recently.  He agrees to talk to urology.

## 2017-08-11 NOTE — Assessment & Plan Note (Signed)
He has significant loss of arch.  Reasonable to try full length soft arch support inserts and update me as needed.  He agrees.

## 2017-08-11 NOTE — Assessment & Plan Note (Signed)
He can use warm compresses, use topical Neosporin.  Try Dial soap.  Update me as needed.  No need for incision and drainage.  He agrees.

## 2017-08-13 ENCOUNTER — Ambulatory Visit: Payer: BLUE CROSS/BLUE SHIELD | Admitting: Psychology

## 2017-08-13 ENCOUNTER — Ambulatory Visit (INDEPENDENT_AMBULATORY_CARE_PROVIDER_SITE_OTHER): Payer: BLUE CROSS/BLUE SHIELD | Admitting: Psychology

## 2017-08-13 DIAGNOSIS — F411 Generalized anxiety disorder: Secondary | ICD-10-CM | POA: Diagnosis not present

## 2017-09-12 ENCOUNTER — Other Ambulatory Visit: Payer: Self-pay | Admitting: Family Medicine

## 2017-09-22 ENCOUNTER — Ambulatory Visit: Payer: BLUE CROSS/BLUE SHIELD | Admitting: Psychology

## 2017-09-22 DIAGNOSIS — F411 Generalized anxiety disorder: Secondary | ICD-10-CM

## 2017-10-12 ENCOUNTER — Ambulatory Visit: Payer: BLUE CROSS/BLUE SHIELD | Admitting: Psychology

## 2017-10-12 DIAGNOSIS — F411 Generalized anxiety disorder: Secondary | ICD-10-CM

## 2017-10-17 NOTE — Progress Notes (Signed)
9:50 PM   Joseph Drake March 27, 1961 [id number]  Referring provider: Tonia Ghent, MD [address] [place], [place] [address]  Chief Complaint  Patient presents with  . Benign Prostatic Hypertrophy    HPI: Patient is a 57 year old Caucasian male who presents today for a one year follow up for BPH with LU TS,  ED and ejaculatory disorders.    Ejaculatory disorder Continues to have issues with ejaculatory disorder.  He has noticed an increase in erectile dysfunction severity and a decrease in his libido as well.  He is aware that the tamsulosin and finasteride may be contributing to this issue.  BPH WITH LUTS His IPSS score today is 15, which is moderate lower urinary tract symptomatology.  He is mostly dissatisfied with his quality life due to his urinary symptoms.  His previous I PSS score was 10/2.   His major complaints today are urinary hesitancy, intermittency and a weak urinary stream.  The symptoms have been worsening over the last few months.  He has had these symptoms for over two years.  He denies any dysuria, hematuria or suprapubic pain.   He also denies any recent fevers, chills, nausea or vomiting.   He does not have a family history of PCa.    He is currently taken tamsulosin and finasteride.    IPSS    Row Name 10/19/17 1600         International Prostate Symptom Score   How often have you had the sensation of not emptying your bladder?  Less than 1 in 5     How often have you had to urinate less than every two hours?  Less than half the time     How often have you found you stopped and started again several times when you urinated?  About half the time     How often have you found it difficult to postpone urination?  Less than half the time     How often have you had a weak urinary stream?  Almost always     How often have you had to strain to start urination?  Less than 1 in 5 times     How many times did you typically get up at night to urinate?  1  Time     Total IPSS Score  15       Quality of Life due to urinary symptoms   If you were to spend the rest of your life with your urinary condition just the way it is now how would you feel about that?  Mostly Disatisfied        Score:  1-7 Mild 8-19 Moderate 20-35 Severe  Erectile dysfunction Has noticed increase in the severity of the ED.  He could not tolerate Cialis due to intense back pain.  He is not having pain or curvature with erections.  He is also noted a decrease in his libido.  PMH: Past Medical History:  Diagnosis Date  . Acid reflux   . Allergy   . BPH (benign prostatic hypertrophy)   . Candida infection   . Dyspnea   . ED (erectile dysfunction)   . Genital herpes   . History of continuous positive airway pressure (CPAP) therapy   . HTN (hypertension)   . Hydronephrosis   . Hyperlipidemia   . Kidney stone on left side   . LVH (left ventricular hypertrophy)   . Obesity   . Palpitation    Holter  in May 2014 with NSR, PVC, rare PACs  . Palpitation   . Sleep apnea   . Testosterone deficiency     Surgical History: Past Surgical History:  Procedure Laterality Date  . CIRCUMCISION    . NASAL SEPTUM SURGERY      Home Medications:  Allergies as of 10/19/2017      Reactions   Niacin And Related Hives   Penicillins    rash   Rosuvastatin    REACTION: myalgia      Medication List        Accurate as of 10/19/17  9:50 PM. Always use your most recent med list.          benazepril 5 MG tablet Commonly known as:  LOTENSIN Take 1 tablet (5 mg total) by mouth daily.   benazepril 10 MG tablet Commonly known as:  LOTENSIN TAKE 0.5 TABLETS (5 MG TOTAL) BY MOUTH DAILY.   buPROPion 75 MG tablet Commonly known as:  WELLBUTRIN TAKE 2 TABLETS BY MOUTH EVERY MORNING TAKE 1 TABLET BY MOUTH EVERY EVENING   cetirizine 10 MG tablet Commonly known as:  ZYRTEC Take 10 mg by mouth daily.   colchicine 0.6 MG tablet TAKE ONE TABLET BY MOUTH DAILY AS NEEDED  FOR GOUT.  Okay to fill with mitigare or colcrys also.   ezetimibe-simvastatin 10-20 MG tablet Commonly known as:  VYTORIN Take 1 tablet by mouth daily.   ezetimibe-simvastatin 10-20 MG tablet Commonly known as:  VYTORIN TAKE 1 TABLET BY MOUTH EVERY DAY   finasteride 5 MG tablet Commonly known as:  PROSCAR TAKE 1 TABLET (5 MG TOTAL) BY MOUTH DAILY.   fluticasone 50 MCG/ACT nasal spray Commonly known as:  FLONASE Place 2 sprays into both nostrils daily.   omeprazole 20 MG capsule Commonly known as:  PRILOSEC Take 20 mg by mouth daily.   sildenafil 100 MG tablet Commonly known as:  VIAGRA Take 1 tablet (100 mg total) by mouth daily as needed for erectile dysfunction. Take two hours prior to intercourse on an empty stomach   tamsulosin 0.4 MG Caps capsule Commonly known as:  FLOMAX Take 1 capsule (0.4 mg total) by mouth daily.   valACYclovir 500 MG tablet Commonly known as:  VALTREX TAKE 1 TABLET (500 MG TOTAL) BY MOUTH DAILY.       Allergies:  Allergies  Allergen Reactions  . Niacin And Related Hives  . Penicillins     rash  . Rosuvastatin     REACTION: myalgia    Family History: Family History  Problem Relation Age of Onset  . Alcohol abuse Mother   . Hyperlipidemia Mother   . Hypertension Mother   . Hypertension Father   . Hyperlipidemia Father   . Colon cancer Paternal Uncle   . Prostate cancer Neg Hx   . Kidney disease Neg Hx   . Allergic rhinitis Neg Hx   . Asthma Neg Hx   . Kidney cancer Neg Hx   . Bladder Cancer Neg Hx     Social History:  reports that he has never smoked. He has never used smokeless tobacco. He reports that he drinks alcohol. He reports that he does not use drugs.  ROS: UROLOGY Frequent Urination?: No Hard to postpone urination?: No Burning/pain with urination?: No Get up at night to urinate?: No Leakage of urine?: No Urine stream starts and stops?: Yes Trouble starting stream?: Yes Do you have to strain to urinate?:  No Blood in urine?: No Urinary tract infection?: No  Sexually transmitted disease?: No Injury to kidneys or bladder?: No Painful intercourse?: No Weak stream?: No Erection problems?: Yes Penile pain?: No  Gastrointestinal Nausea?: No Vomiting?: No Indigestion/heartburn?: No Diarrhea?: No Constipation?: No  Constitutional Fever: No Night sweats?: No Weight loss?: No Fatigue?: No  Skin Skin rash/lesions?: No Itching?: No  Eyes Blurred vision?: No Double vision?: No  Ears/Nose/Throat Sore throat?: No Sinus problems?: No  Hematologic/Lymphatic Swollen glands?: No Easy bruising?: No  Cardiovascular Leg swelling?: No Chest pain?: No  Respiratory Cough?: No Shortness of breath?: No  Endocrine Excessive thirst?: No  Musculoskeletal Back pain?: No Joint pain?: No  Neurological Headaches?: No Dizziness?: No  Psychologic Depression?: No Anxiety?: No  Physical Exam: BP 126/84 (BP Location: Right Arm, Patient Position: Sitting, Cuff Size: Large)   Pulse 67   Ht 5\' 9"  (1.753 m)   Wt 216 lb 12.8 oz (98.3 kg)   BMI 32.02 kg/m   Constitutional: Well nourished. Alert and oriented, No acute distress. HEENT:  AT, moist mucus membranes. Trachea midline, no masses. Cardiovascular: No clubbing, cyanosis, or edema. Respiratory: Normal respiratory effort, no increased work of breathing. GI: Abdomen is soft, non tender, non distended, no abdominal masses. Liver and spleen not palpable.  No hernias appreciated.  Stool sample for occult testing is not indicated.   GU: No CVA tenderness.  No bladder fullness or masses.  Patient with circumcised phallus.  Urethral meatus is patent.  No penile discharge. No penile lesions or rashes. Scrotum without lesions, cysts, rashes and/or edema.  Testicles are located scrotally bilaterally. No masses are appreciated in the testicles. Left and right epididymis are normal. Rectal: Patient with  normal sphincter tone. Anus and  perineum without scarring or rashes. No rectal masses are appreciated. Prostate is approximately 45 grams,deep median sulcus, no nodules are appreciated. Seminal vesicles are normal. Skin: No rashes, bruises or suspicious lesions. Lymph: No cervical or inguinal adenopathy. Neurologic: Grossly intact, no focal deficits, moving all 4 extremities. Psychiatric: Normal mood and affect.   Laboratory Data:  Lab Results  Component Value Date   CREATININE 1.17 08/10/2017    Lab Results  Component Value Date   PSA 0.15 08/10/2017   PSA 0.19 08/08/2016   PSA 0.17 07/03/2015   Corrected PSA values  0.34 in 06/2015      0.38 in 07/2016      0.30 in 07/2017     Component Value Date/Time   CHOL 159 08/10/2017 1608   HDL 35.10 (L) 08/10/2017 1608   CHOLHDL 5 08/10/2017 1608   VLDL 41.0 (H) 08/10/2017 1608   LDLCALC 82 08/08/2016 1043    Lab Results  Component Value Date   AST 17 08/10/2017   Lab Results  Component Value Date   ALT 27 08/10/2017   I have reviewed the labs.  Assessment & Plan:    1. BPH with LUTS:   Patient's IPSS score is 15/4.  It is worsening.  He will continue the tamsulosin and finasteride for now, but we will schedule him for cystoscopy to evaluate for bladder outlet obstruction and he is interested in a bladder outlet procedure if he is a candidate.   I have explained to the patient that they will  be scheduled for a cystoscopy in our office to evaluate their bladder.  The cystoscopy consists of passing a tube with a lens up through their urethra and into their urinary bladder.   We will inject the urethra with a lidocaine gel prior to introducing the cystoscope  to help with any discomfort during the procedure.   After the procedure, they might experience blood in the urine and discomfort with urination.  This will abate after the first few voids.  I have  encouraged the patient to increase water intake  during this time.  Patient denies any allergies to lidocaine.    2. Erectile dysfunction Patient given a prescription for Viagra 100 mg.  Instructed to take the medication two hours prior to intercourse on an empty stomach.  He is warned not to take the medications that contain nitrates.  I also advised him of the side effects, such as: headache, flushing, dyspepsia, abnormal vision, nasal congestion, back pain, myalgia, nausea, dizziness, and rash. Will also check a TSH and testosterone level when he returns for his cystoscopy  3. Ejaculatory disorder  - currently seeing a therapist  Return for cystoscopy to evaluate for BOO.  These notes generated with voice recognition software. I apologize for typographical errors.  Zara Council, PA-C  [facility] [address] [place], [place] [address] [phone]

## 2017-10-19 ENCOUNTER — Ambulatory Visit: Payer: BLUE CROSS/BLUE SHIELD | Admitting: Urology

## 2017-10-19 ENCOUNTER — Encounter: Payer: Self-pay | Admitting: Urology

## 2017-10-19 VITALS — BP 126/84 | HR 67 | Ht 69.0 in | Wt 216.8 lb

## 2017-10-19 DIAGNOSIS — N5319 Other ejaculatory dysfunction: Secondary | ICD-10-CM | POA: Diagnosis not present

## 2017-10-19 DIAGNOSIS — N529 Male erectile dysfunction, unspecified: Secondary | ICD-10-CM

## 2017-10-19 DIAGNOSIS — N138 Other obstructive and reflux uropathy: Secondary | ICD-10-CM | POA: Diagnosis not present

## 2017-10-19 DIAGNOSIS — N401 Enlarged prostate with lower urinary tract symptoms: Secondary | ICD-10-CM | POA: Diagnosis not present

## 2017-10-19 MED ORDER — TAMSULOSIN HCL 0.4 MG PO CAPS: 0.4000 mg | ORAL_CAPSULE | Freq: Every day | ORAL | 3 refills | Status: DC

## 2017-10-19 MED ORDER — FINASTERIDE 5 MG PO TABS: ORAL_TABLET | ORAL | 3 refills | Status: DC

## 2017-10-19 MED ORDER — SILDENAFIL CITRATE 100 MG PO TABS
100.0000 mg | ORAL_TABLET | Freq: Every day | ORAL | 12 refills | Status: DC | PRN
Start: 2017-10-19 — End: 2019-08-18

## 2017-10-20 ENCOUNTER — Telehealth: Payer: Self-pay | Admitting: Urology

## 2017-10-20 ENCOUNTER — Other Ambulatory Visit: Payer: Self-pay | Admitting: Urology

## 2017-10-20 MED ORDER — DIAZEPAM 10 MG PO TABS: ORAL_TABLET | ORAL | 0 refills | Status: DC

## 2017-10-20 NOTE — Progress Notes (Signed)
Valium script written.

## 2017-10-20 NOTE — Telephone Encounter (Signed)
I have printed the prescription for Valium 10 mg to take prior to his cystoscopy.

## 2017-10-20 NOTE — Telephone Encounter (Signed)
Would you be willing to send a script for his nerves? please advise

## 2017-10-20 NOTE — Telephone Encounter (Signed)
Pt called and changed cysto appt w/Brandon to Friday 8/23 in the morning so his wife could come with him.  Pt is extremely nervous about this and said Larene Beach mentioned at appt that he could get something to help calm him.  Please give pt a call and let him know if this is something we can do.

## 2017-10-20 NOTE — Telephone Encounter (Signed)
Patient notified

## 2017-11-09 ENCOUNTER — Ambulatory Visit: Payer: BLUE CROSS/BLUE SHIELD | Admitting: Psychology

## 2017-11-09 DIAGNOSIS — F411 Generalized anxiety disorder: Secondary | ICD-10-CM

## 2017-11-17 ENCOUNTER — Other Ambulatory Visit: Payer: BLUE CROSS/BLUE SHIELD

## 2017-11-17 DIAGNOSIS — N5319 Other ejaculatory dysfunction: Secondary | ICD-10-CM

## 2017-11-17 DIAGNOSIS — N529 Male erectile dysfunction, unspecified: Secondary | ICD-10-CM

## 2017-11-17 DIAGNOSIS — N401 Enlarged prostate with lower urinary tract symptoms: Principal | ICD-10-CM

## 2017-11-17 DIAGNOSIS — N138 Other obstructive and reflux uropathy: Secondary | ICD-10-CM

## 2017-11-18 LAB — TESTOSTERONE: Testosterone: 384 ng/dL (ref 264–916)

## 2017-11-18 LAB — TSH: TSH: 1.92 u[IU]/mL (ref 0.450–4.500)

## 2017-12-01 ENCOUNTER — Other Ambulatory Visit: Payer: BLUE CROSS/BLUE SHIELD | Admitting: Urology

## 2017-12-09 ENCOUNTER — Ambulatory Visit: Payer: BLUE CROSS/BLUE SHIELD | Admitting: Psychology

## 2017-12-09 DIAGNOSIS — F411 Generalized anxiety disorder: Secondary | ICD-10-CM | POA: Diagnosis not present

## 2017-12-18 ENCOUNTER — Encounter: Payer: Self-pay | Admitting: Urology

## 2017-12-18 ENCOUNTER — Ambulatory Visit: Payer: BLUE CROSS/BLUE SHIELD | Admitting: Urology

## 2017-12-18 VITALS — BP 128/81 | HR 108 | Ht 69.0 in | Wt 210.0 lb

## 2017-12-18 DIAGNOSIS — N401 Enlarged prostate with lower urinary tract symptoms: Secondary | ICD-10-CM

## 2017-12-18 DIAGNOSIS — N138 Other obstructive and reflux uropathy: Secondary | ICD-10-CM | POA: Diagnosis not present

## 2017-12-18 LAB — URINALYSIS, COMPLETE
Bilirubin, UA: NEGATIVE
Glucose, UA: NEGATIVE
Ketones, UA: NEGATIVE
Leukocytes, UA: NEGATIVE
Nitrite, UA: NEGATIVE
Protein, UA: NEGATIVE
Specific Gravity, UA: 1.015 (ref 1.005–1.030)
Urobilinogen, Ur: 0.2 mg/dL (ref 0.2–1.0)
pH, UA: 5.5 (ref 5.0–7.5)

## 2017-12-18 LAB — BLADDER SCAN AMB NON-IMAGING

## 2017-12-18 MED ORDER — CIPROFLOXACIN HCL 500 MG PO TABS
500.0000 mg | ORAL_TABLET | Freq: Once | ORAL | Status: AC
  Administered 2017-12-18: 500 mg via ORAL

## 2017-12-18 MED ORDER — LIDOCAINE HCL URETHRAL/MUCOSAL 2 % EX GEL
1.0000 | Freq: Once | CUTANEOUS | Status: AC
Start: 2017-12-18 — End: 2017-12-18
  Administered 2017-12-18: 1 via URETHRAL

## 2017-12-18 NOTE — Progress Notes (Signed)
   12/18/17  CC:  Chief Complaint  Patient presents with  . Cysto    HPI: 57 year old male with refractory lower urinary tract symptoms presumably secondary to BPH who presents today for cystoscopy today for further evaluation of his prostatic anatomy.  He is currently on Flomax and finasteride.  He reports initial improvement with Flomax but his symptoms have subsequently returned.  He is noticed no change of finasteride.  His primary complaints are urinary hesitancy, intermittency, and weak stream.  He feels like he does not empty his bladder completely.  He reports that when he relaxes a bit more, he is able to void a second time and FDS bladder more efficiently.  He is extremely anxious about cystoscopy today.  He required a pre-ureteral Valium.  Blood pressure 128/81, pulse (!) 108, height 5\' 9"  (1.753 m), weight 210 lb (95.3 kg). NED. A&Ox3.   No respiratory distress   Abd soft, NT, ND Normal phallus with bilateral descended testicles  Cystoscopy Procedure Note  Patient identification was confirmed, informed consent was obtained, and patient was prepped using Betadine solution.  Lidocaine jelly was administered per urethral meatus.    Preoperative abx where received prior to procedure.     Pre-Procedure: - Inspection reveals a normal caliber ureteral meatus.  Procedure: The flexible cystoscope was introduced without difficulty - No urethral strictures/lesions are present. - Normal prostate without significant coaptation, prostate length approximately 3 cm - Normal bladder neck - Bilateral ureteral orifices identified - Bladder mucosa  reveals no ulcers, tumors, or lesions - No bladder stones - No trabeculation  Retroflexion shows no significant intravesical median lobe or protrusion, unremarkable   Post-Procedure: - Patient tolerated the procedure well  Assessment/ Plan:  1. BPH with obstruction/lower urinary tract symptoms Prostate without an obviously  obstructive appearance, no trabeculation or obvious sequela of chronic outlet obstruction Prostate is relatively small on cystoscopic exam He may be a reasonable candidate for TURP versus UroLift in order to stop his BPH medications We also discussed the role of urodynamics to assess for obstruction Somewhat suspicious that he may have underlying pelvic floor dysfunction especially given his history of significant anxiety, improved voiding with relaxation He was given literature on both of the procedures today and will return with a full bladder for uroflow to discuss further - Urinalysis, Complete - ciprofloxacin (CIPRO) tablet 500 mg - lidocaine (XYLOCAINE) 2 % jelly 1 application - BLADDER SCAN AMB NON-IMAGING  Return in about 2 weeks (around 01/01/2018) for Uroflow (full bladder), discuss outlet procedures.

## 2017-12-18 NOTE — Patient Instructions (Signed)
Transurethral Resection of the Prostate Transurethral resection of the prostate (TURP) is the removal (resection) of part of the gland that produces semen (prostate gland). This procedure is done to treat benign prostatic hyperplasia (BPH). BPH is an abnormal, noncancerous (benign) increase in the number of cells that make up the prostate tissue. BPH causes the prostate to get bigger. The enlarged prostate can push against or block the tube that drains urine from the bladder out of the body (urethra). BPH can affect normal urine flow by causing bladder infections, difficulty controlling bladder function, and difficulty emptying the bladder. The goal of TURP is to remove enough prostate tissue to allow for a normal flow of urine. In a transurethral resection, a thin telescope with a light, a tiny camera, and an electric cutting edge (resectoscope) is passed through the urethra and into the prostate. The opening of the urethra is at the end of the penis. Tell a health care provider about:  Any allergies you have.  All medicines you are taking, including vitamins, herbs, eye drops, creams, and over-the-counter medicines.  Any problems you or family members have had with anesthetic medicines.  Any blood disorders you have.  Any surgeries you have had.  Any medical conditions you have.  Any prostate infections you have had. What are the risks? Generally, this is a safe procedure. However, problems may occur, including:  Infection.  Bleeding.  Allergic reactions to medicines.  Damage to other structures or organs, such as: ? The urethra. ? The bladder. ? Muscles that surround the prostate.  Difficulty getting an erection.  Difficulty controlling urination (incontinence).  Scarring, which may cause problems with urine flow.  What happens before the procedure?  Follow instructions from your health care provider about eating or drinking restrictions.  Ask your health care provider  about: ? Changing or stopping your regular medicines. This is especially important if you are taking diabetes medicines or blood thinners. ? Taking medicines such as aspirin and ibuprofen. These medicines can thin your blood. Do not take these medicines before your procedure if your health care provider instructs you not to.  You may have a physical exam.  You may have a blood or urine sample taken.  You may be given antibiotic medicine to help prevent infection.  Ask your health care provider how your surgical site will be marked or identified.  Plan to have someone take you home after the procedure. You may not be able to drive for up to 10 days after your procedure. What happens during the procedure?  To reduce your risk of infection: ? Your health care team will wash or sanitize their hands. ? Your skin will be washed with soap.  An IV tube will be inserted into one of your veins.  You will be given one or more of the following: ? A medicine to help you relax (sedative). ? A medicine to make you fall asleep (general anesthetic). ? A medicine that is injected into your spine to numb the area below and slightly above the injection site (spinal anesthetic).  Your legs will be placed in foot rests (stirrups) so that your legs are apart and your knees are bent.  The resectoscope will be passed through your urethra to your prostate.  Parts of your prostate will be resected using the cutting edge of the resectoscope.  The resectoscope will be removed.  A thin, flexible tube (catheter) will be passed through your urethra and into your bladder. The catheter will drain   urine into a bag outside of your body. ? Fluid may be passed through the catheter to keep the catheter open. The procedure may vary among health care providers and hospitals. What happens after the procedure?  Your blood pressure, heart rate, breathing rate, and blood oxygen level will be monitored often until the  medicines you were given have worn off.  You may continue to receive fluids and medicines through an IV tube.  You may have some pain. Pain medicine will be available to help you.  You will have a catheter draining your urine. ? You may have blood in your urine. Your catheter may be kept in until your urine is clear. ? Your urinary drainage will be monitored. If necessary, your bladder may be rinsed out (irrigated) through your catheter.  You will be encouraged to walk around as soon as possible.  You may have to wear compression stockings. These stockings help prevent blood clots and reduce swelling in your legs.  Do not drive for 24 hours if you received a sedative. This information is not intended to replace advice given to you by your health care provider. Make sure you discuss any questions you have with your health care provider. Document Released: 04/14/2005 Document Revised: 12/16/2015 Document Reviewed: 01/04/2015 [employer] Interactive Patient Education  2018 [employer]  

## 2018-01-01 ENCOUNTER — Ambulatory Visit (INDEPENDENT_AMBULATORY_CARE_PROVIDER_SITE_OTHER): Payer: BLUE CROSS/BLUE SHIELD | Admitting: Urology

## 2018-01-01 VITALS — BP 129/88 | HR 90 | Ht 69.0 in | Wt 216.0 lb

## 2018-01-01 DIAGNOSIS — N401 Enlarged prostate with lower urinary tract symptoms: Secondary | ICD-10-CM | POA: Diagnosis not present

## 2018-01-01 DIAGNOSIS — N138 Other obstructive and reflux uropathy: Secondary | ICD-10-CM

## 2018-01-01 DIAGNOSIS — M6289 Other specified disorders of muscle: Secondary | ICD-10-CM | POA: Diagnosis not present

## 2018-01-01 NOTE — Progress Notes (Signed)
01/01/2018 4:27 PM   Joseph Drake 05/24/1960 [id number]  Referring provider: Tonia Ghent, MD [address] [place], [place] [address]  Chief Complaint  Patient presents with  . Follow-up    HPI: 57 year old male who returns the office today primarily to discuss the option of outlet procedures.  He was previously seen and evaluated the office for refractory lower urinary tract symptoms.  Symptoms include urinary hesitancy, intermittency and weak stream.  He reports that on occasion, when he relaxes a little bit, he is able to void without difficulty.  He reports needing to sit and wait at times for stream to start and ultimately will start once he relaxes his body.  He is currently on maximal medical therapy with finasteride and Flomax.  Cystoscopy on 11/2017 showed relatively normal prostate without significant coaptation, small prostate, 3 cm length.  Today, he required Valium for his procedure.  Most recent PSA 0.15 on 07/2017.   PMH: Past Medical History:  Diagnosis Date  . Acid reflux   . Allergy   . BPH (benign prostatic hypertrophy)   . Candida infection   . Dyspnea   . ED (erectile dysfunction)   . Genital herpes   . History of continuous positive airway pressure (CPAP) therapy   . HTN (hypertension)   . Hydronephrosis   . Hyperlipidemia   . Kidney stone on left side   . LVH (left ventricular hypertrophy)   . Obesity   . Palpitation    Holter in May 2014 with NSR, PVC, rare PACs  . Palpitation   . Sleep apnea   . Testosterone deficiency     Surgical History: Past Surgical History:  Procedure Laterality Date  . CIRCUMCISION    . NASAL SEPTUM SURGERY      Home Medications:  Allergies as of 01/01/2018      Reactions   Niacin And Related Hives   Penicillins    rash   Rosuvastatin    REACTION: myalgia      Medication List        Accurate as of 01/01/18 11:59 PM. Always use your most recent med list.          benazepril 5 MG  tablet Commonly known as:  LOTENSIN Take 1 tablet (5 mg total) by mouth daily.   benazepril 10 MG tablet Commonly known as:  LOTENSIN TAKE 0.5 TABLETS (5 MG TOTAL) BY MOUTH DAILY.   buPROPion 75 MG tablet Commonly known as:  WELLBUTRIN TAKE 2 TABLETS BY MOUTH EVERY MORNING TAKE 1 TABLET BY MOUTH EVERY EVENING   cetirizine 10 MG tablet Commonly known as:  ZYRTEC Take 10 mg by mouth daily.   colchicine 0.6 MG tablet TAKE ONE TABLET BY MOUTH DAILY AS NEEDED FOR GOUT.  Okay to fill with mitigare or colcrys also.   diazepam 10 MG tablet Commonly known as:  VALIUM Take one tablet 30 minutes prior to the cystoscopy.  Please have a driver with you.   ezetimibe-simvastatin 10-20 MG tablet Commonly known as:  VYTORIN Take 1 tablet by mouth daily.   ezetimibe-simvastatin 10-20 MG tablet Commonly known as:  VYTORIN TAKE 1 TABLET BY MOUTH EVERY DAY   finasteride 5 MG tablet Commonly known as:  PROSCAR TAKE 1 TABLET (5 MG TOTAL) BY MOUTH DAILY.   fluticasone 50 MCG/ACT nasal spray Commonly known as:  FLONASE Place 2 sprays into both nostrils daily.   omeprazole 20 MG capsule Commonly known as:  PRILOSEC Take 20 mg by mouth  daily.   sildenafil 100 MG tablet Commonly known as:  VIAGRA Take 1 tablet (100 mg total) by mouth daily as needed for erectile dysfunction. Take two hours prior to intercourse on an empty stomach   tamsulosin 0.4 MG Caps capsule Commonly known as:  FLOMAX Take 1 capsule (0.4 mg total) by mouth daily.   valACYclovir 500 MG tablet Commonly known as:  VALTREX TAKE 1 TABLET (500 MG TOTAL) BY MOUTH DAILY.       Allergies:  Allergies  Allergen Reactions  . Niacin And Related Hives  . Penicillins     rash  . Rosuvastatin     REACTION: myalgia    Family History: Family History  Problem Relation Age of Onset  . Alcohol abuse Mother   . Hyperlipidemia Mother   . Hypertension Mother   . Hypertension Father   . Hyperlipidemia Father   . Colon  cancer Paternal Uncle   . Prostate cancer Neg Hx   . Kidney disease Neg Hx   . Allergic rhinitis Neg Hx   . Asthma Neg Hx   . Kidney cancer Neg Hx   . Bladder Cancer Neg Hx     Social History:  reports that he has never smoked. He has never used smokeless tobacco. He reports that he drinks alcohol. He reports that he does not use drugs.  ROS: UROLOGY Frequent Urination?: Yes Hard to postpone urination?: No Burning/pain with urination?: No Get up at night to urinate?: Yes Leakage of urine?: No Urine stream starts and stops?: Yes Trouble starting stream?: Yes Do you have to strain to urinate?: No Blood in urine?: No Urinary tract infection?: No Sexually transmitted disease?: No Injury to kidneys or bladder?: No Painful intercourse?: No Weak stream?: No Erection problems?: No Penile pain?: No  Gastrointestinal Nausea?: No Vomiting?: No Indigestion/heartburn?: No Diarrhea?: No Constipation?: No  Constitutional Fever: No Night sweats?: No Weight loss?: No Fatigue?: No  Skin Skin rash/lesions?: No Itching?: No  Eyes Blurred vision?: No Double vision?: No  Ears/Nose/Throat Sore throat?: No Sinus problems?: No  Hematologic/Lymphatic Swollen glands?: No Easy bruising?: No  Cardiovascular Leg swelling?: No Chest pain?: No  Respiratory Cough?: No Shortness of breath?: No  Endocrine Excessive thirst?: No  Musculoskeletal Back pain?: No Joint pain?: No  Neurological Headaches?: No Dizziness?: No  Psychologic Depression?: No Anxiety?: No  Physical Exam: BP 129/88   Pulse 90   Ht 5\' 9"  (1.753 m)   Wt 216 lb (98 kg)   BMI 31.90 kg/m   Constitutional:  Alert and oriented, No acute distress.  Mood by wife today. HEENT: [place] AT, moist mucus membranes.  Trachea midline, no masses. Cardiovascular: No clubbing, cyanosis, or edema. Respiratory: Normal respiratory effort, no increased work of breathing. Skin: No rashes, bruises or suspicious  lesions. Neurologic: Grossly intact, no focal deficits, moving all 4 extremities. Psychiatric: Normal mood and affect.  Laboratory Data: Lab Results  Component Value Date   WBC 9.6 07/15/2014   HGB 15.4 07/15/2014   HCT 44.4 07/15/2014   MCV 88.4 07/15/2014   PLT 217 07/15/2014    Lab Results  Component Value Date   CREATININE 1.17 08/10/2017    Lab Results  Component Value Date   PSA 0.15 08/10/2017   PSA 0.19 08/08/2016   PSA 0.17 07/03/2015    Lab Results  Component Value Date   TESTOSTERONE 384 11/17/2017    Urinalysis N/a  Pertinent Imaging: n/a  Assessment & Plan:    1. Pelvic floor dysfunction  We discussed that given the relatively nonobstructing appearance of his prostate as well as history of improved urination with relaxation, I am somewhat suspicious that he has some underlying pelvic floor dysfunction which is contributing to his urinary symptoms We discussed that this often can be improved with physical therapy to which he is agreeable - Ambulatory referral to Physical Therapy  2. BPH with obstruction/lower urinary tract symptoms Lengthy discussion today about the etiology of his urinary issues He is currently on maximal medical therapy, continue Flomax and finasteride for the time being We discussed the option of pursuing urodynamics to prove whether or not he obstructed especially given his relatively nonobstructing appearance of his prostate We also discussed that we could go ahead and pursue a procedure such as TURP versus UroLift as well as the risk and benefits of each of these procedures which will certainly take obstruction out of the equation At this point in time, they are more interested in pursuing treatment of #1 and then reassess his urinary symptoms   Return in about 3 months (around 04/02/2018) for IPSS/ PVR.  Hollice Espy, MD  [facility] [address] [place],  [address] [phone]

## 2018-01-01 NOTE — Progress Notes (Signed)
Uroflow  Peak Flow: 4.88ml Average Flow: 4.5ml Voided Volume: 137ml Voiding Time: 37.5sec Flow Time: 33.2sec Time to Peak Flow: 4.3sec

## 2018-01-04 ENCOUNTER — Ambulatory Visit: Payer: BLUE CROSS/BLUE SHIELD | Admitting: Psychology

## 2018-01-04 DIAGNOSIS — F411 Generalized anxiety disorder: Secondary | ICD-10-CM | POA: Diagnosis not present

## 2018-01-13 ENCOUNTER — Other Ambulatory Visit: Payer: Self-pay

## 2018-01-13 ENCOUNTER — Encounter: Payer: Self-pay | Admitting: Physical Therapy

## 2018-01-13 ENCOUNTER — Ambulatory Visit: Payer: BLUE CROSS/BLUE SHIELD | Attending: Urology | Admitting: Physical Therapy

## 2018-01-13 DIAGNOSIS — M6208 Separation of muscle (nontraumatic), other site: Secondary | ICD-10-CM | POA: Diagnosis not present

## 2018-01-13 DIAGNOSIS — R29898 Other symptoms and signs involving the musculoskeletal system: Secondary | ICD-10-CM | POA: Insufficient documentation

## 2018-01-13 DIAGNOSIS — R279 Unspecified lack of coordination: Secondary | ICD-10-CM | POA: Insufficient documentation

## 2018-01-13 NOTE — Patient Instructions (Addendum)
Increase miback mobility:  open book ( handout0 15 reps, x 2 x day     Stretches every hour ar desk standing:      - 6 directions of the spine, sidebend, arm swings, minisquat and reach hands behind, clasping hands, chest lift and look up          5 reps each direction       - proper foot on chair and rocks hip to stretch front of hips and adductors.       _______  Urination when sitting: Drag hands and elbows back on thigh to arch midback up 10 reps  then   practice lengthened breathing count of 1-2 pause, exhale 2-1 pause  , expanding diaphragm to relax pelvic floor     _____  Workstation handout  :  raise monitor,  get a key board tray, have your mouse closer, raise your seat higher to relax shoulders down

## 2018-01-14 NOTE — Addendum Note (Signed)
Addended by: Jerl Mina on: 01/14/2018 07:56 PM   Modules accepted: Orders

## 2018-01-14 NOTE — Therapy (Addendum)
[place] [facility] [address] [place], [place], [address] Phone: [phone]   Fax:  [phone]  Physical Therapy Evaluation  Patient Details  Name: Joseph Drake MRN: [id number] Date of Birth: August 15, 1960 Referring Provider: Erlene Quan MD   Encounter Date: 01/13/2018  PT End of Session - 01/14/18 1856    Visit Number  1    Number of Visits  12    Date for PT Re-Evaluation  04/08/18    PT Start Time  1400    PT Stop Time  1515    PT Time Calculation (min)  75 min    Activity Tolerance  Patient tolerated treatment well;No increased pain       Past Medical History:  Diagnosis Date  . Acid reflux   . Allergy   . BPH (benign prostatic hypertrophy)   . Candida infection   . Dyspnea   . ED (erectile dysfunction)   . Genital herpes   . History of continuous positive airway pressure (CPAP) therapy   . HTN (hypertension)   . Hydronephrosis   . Hyperlipidemia   . Kidney stone on left side   . LVH (left ventricular hypertrophy)   . Obesity   . Palpitation    Holter in May 2014 with NSR, PVC, rare PACs  . Palpitation   . Sleep apnea   . Testosterone deficiency     Past Surgical History:  Procedure Laterality Date  . CIRCUMCISION    . NASAL SEPTUM SURGERY      There were no vitals filed for this visit.   Subjective Assessment - 01/13/18 1516    Subjective  Sometimes pt has difficulty urinating. It starts slow and with a weak stream. Pt has associated this with his enlarged prostate which he manages with his medications.  Nocturia prior to taking medications 2-3 x night, and currently 1 x night.  Pt finds difficulty with urinating with standing and when he has waited sometime to go. Pt can not relax when using public stalls.   Pt has undergone a flow test but he finds it difficult to relax to start urinating. If he sits down, he can relax more and take more time to begin but it is still slow stream with multiple trials  before completely emptying. Pt can feel his muscles tighten back up. Daily fluid intake: "not enough of water",  2-3 glasses of tea, 2 -3 12 fl oz of sodas, no coffee nor alcohol.  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 Physical activity :  no regular routine. Takes a 10 min walk x 2 day.       Pertinent History  Pt has been wearing a CPAP for 8 years for OSA.  Pt has passed the kidney in 2014-08-01. Pt has had gout and an enlarged L ventricle of the heart. Pt has not gone to see his cardiologist since 2011-12.           [facility] PT Assessment - 01/13/18 1516      Assessment   Medical Diagnosis  Pelvic Floor dysfunction    Referring Provider  Erlene Quan MD      Precautions   Precautions  None      Restrictions   Weight Bearing Restrictions  No      Balance Screen   Has the patient fallen in the past 6 months  No      Observation/Other Assessments   Observations  ankles crossed. picture  of his workstation revievwed from his phone. Noted computer monitor too low, R upper trap elevation due to high mouse placement on table       Coordination   Gross Motor Movements are Fluid and Coordinated  --   chest breathing, limited posterior excursion of diaphragm      Single Leg Stance   Comments  SLS no deviations      AROM   Overall AROM Comments  WLF       PROM   Overall PROM Comments  R hip IR 12 deg, L 15 deg ( post Tx: 20 deg B )        Palpation   Spinal mobility  hypmobility at upper thoracic , upper trap/ mid trap  tensions     SI assessment   L hypomobility at SIJ     Palpation comment  R ASIS lower ( post Tx levelled)                  Objective measurements completed on examination: See above findings.    Pelvic Floor Special Questions - 01/13/18 1536    Diastasis Recti  neg       [facility] Adult PT Treatment/Exercise - 01/13/18 1550      Therapeutic Activites    Therapeutic Activities  --   ergonomic assessment of work  station     Exercises   Exercises  --   see pt instructions     Manual Therapy   Manual therapy comments  R long axis distraction,  rotational mob on L SIJ,                   PT Long Term Goals - 01/14/18 1913      PT LONG TERM GOAL #1   Title  Pt will decrease NIH-CPSI score from 60% to < 30 % in order to return urinary function     Time  12    Period  Weeks    Status  New    Target Date  04/08/18      PT LONG TERM GOAL #2   Title  Pt will report being able to relax and urinate in a public restroom of multiple stalls in order to participate in community events    Time  5    Period  Weeks    Status  New    Target Date  02/18/18      PT LONG TERM GOAL #3   Title    Time  10    Period  Weeks    Status  New    Target Date  03/25/18      PT LONG TERM GOAL #4   Title  Pt will demo increased spinal mobility in the thoracic area to optimize diaphragm and pelvic floor function     Time  4    Period  Weeks    Status  New    Target Date  02/11/18             Plan - 01/14/18 1904    Clinical Impression Statement  Pt is a  57 yo male who reports difficulty initiating urination and relaxing his pelvic floor muscles. Pt can ot relax in public restrooms with multiple stalls. Standing is more difficult than sitting when urinating. Pt presents with limited spinal mobility, dyscoordination and strength of pelvic floor mm, weak hip weakness/ hypomobility, poor body mechanics which places strain on the abdominal/pelvic floor mm. These are deficits that  indicate an ineffective intraabdominal pressure system associated with increased risk for urinary leakage. Pt works a sedentary job, lacks a regular physical activity routine, has Hx of kidney stones, gout, and L enlarged L ventricle of the heart. Pt has not gone to see his cardiologist since 2011-12.     Today, pt demo'd increased spinal/ hip mobility and demo'd increased diaphragmatic excursion post-Tx. Progressed pt to  stretches for spine to implement at work and educated pt relaxation/ breathing technique to use when urinating in a seated position. Pt demo'd HEP correctly. Pt was provided education on etiology of Sx with anatomy, physiology explanation with images along with the benefits of customized pelvic PT Tx based on pt's medical conditions and musculoskeletal deficits.         Clinical Presentation  Evolving    Clinical Decision Making  Moderate    Rehab Potential  Good    PT Frequency  1x / week    PT Duration  12 weeks    PT Treatment/Interventions  Patient/family education;Therapeutic activities;Therapeutic exercise;Balance training;Gait training;Moist Heat;Neuromuscular re-education;Functional mobility training;Aquatic Therapy;Manual techniques;Dry needling;Scar mobilization;Compression bandaging;Splinting;Manual lymph drainage;Energy conservation;Biofeedback    Consulted and Agree with Plan of Care  Patient       Patient will benefit from skilled therapeutic intervention in order to improve the following deficits and impairments:  Improper body mechanics, Postural dysfunction, Decreased scar mobility, Decreased mobility, Decreased coordination, Decreased endurance, Decreased range of motion, Decreased strength, Hypomobility, Difficulty walking, Decreased balance, Decreased safety awareness, Increased muscle spasms, Abnormal gait, Decreased activity tolerance, Increased fascial restricitons  Visit Diagnosis: Other symptoms and signs involving the musculoskeletal system  Unspecified lack of coordination     Problem List Patient Active Problem List   Diagnosis Date Noted  . Skin lesion 08/11/2017  . Toenail deformity 08/11/2017  . Anxiety 09/17/2016  . GERD (gastroesophageal reflux disease) 08/10/2016  . Rosacea 08/10/2016  . Laryngopharyngeal reflux (LPR) 06/09/2016  . Chronic pansinusitis 05/01/2016  . Hypertrophy, nasal, turbinate 05/01/2016  . Nasal congestion 04/03/2016  . Gout  01/16/2016  . Encounter for screening examination for infectious disease 01/16/2016  . Left foot pain 01/12/2016  . BPH with obstruction/lower urinary tract symptoms 09/21/2015  . 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 Encounter for general adult medical examination with abnormal findings 07/09/2015  . Advance care planning 07/09/2015  . Knee pain 07/09/2015  . Essential hypertension 04/23/2010  . Obstructive sleep apnea 03/13/2010  . VENTRICULAR HYPERTROPHY, LEFT 03/13/2010  . DYSPNEA ON EXERTION 01/11/2010  . CANDIDIASIS, UROGENITAL 10/12/2009  . Genital herpes 11/27/2008  . HYPERSOMNIA, ASSOCIATED WITH SLEEP APNEA 08/29/2008  . ALLERGIC RHINITIS 06/27/2008  . TESTOSTERONE DEFICIENCY 01/28/2008  . ERECTILE DYSFUNCTION 01/10/2008  . HLD (hyperlipidemia) 09/23/2006    Jerl Mina ,PT, DPT, E-RYT  01/14/2018, 7:23 PM  [place] [facility] [address] [place], [place], [address] Phone: [phone]   Fax:  [phone]  Name: Joseph Drake MRN: [id number] Date of Birth: 27-Nov-1960

## 2018-01-15 ENCOUNTER — Encounter: Payer: Self-pay | Admitting: Family Medicine

## 2018-01-15 ENCOUNTER — Ambulatory Visit: Payer: BLUE CROSS/BLUE SHIELD | Admitting: Family Medicine

## 2018-01-15 VITALS — BP 106/70 | HR 88 | Temp 97.9°F | Ht 69.0 in | Wt 217.5 lb

## 2018-01-15 DIAGNOSIS — F419 Anxiety disorder, unspecified: Secondary | ICD-10-CM | POA: Diagnosis not present

## 2018-01-15 MED ORDER — ESCITALOPRAM OXALATE 5 MG PO TABS: 5.0000 mg | ORAL_TABLET | Freq: Every day | ORAL | 5 refills | Status: DC

## 2018-01-15 NOTE — Patient Instructions (Signed)
Add on lexapro 5mg  a day for 1 month.  Continue 3 wellbutrin a day for now.   Then cut wellbutrin back to 1 pill twice a day for 2 weeks.   If tolerated, then increase the lexapro to 10mg  a day for 1 month.  Then cut wellbutrin back to 1 pill once a day for 2 weeks.   I would greatly appreciate an update in a few weeks and also at any point in the transition where you had concerns.   Take care.  Glad to see you.

## 2018-01-15 NOTE — Progress Notes (Signed)
D/w pt about mood and wellbutrin.  He had been in counseling, seeing Terri Bauert.  He is wondering if the wellbutrin is working well enough.  He is still anxious, dealing with obsessive thoughts.  He didn't know about a change to SSRI.  We talked about concern for sexual side effects with med.  No Si/Hi.  Compliant with medications.  Meds, vitals, and allergies reviewed.   ROS: Per HPI unless specifically indicated in ROS section   GEN: nad, alert and oriented HEENT: mucous membranes moist NECK: supple w/o LA CV: rrr.  PULM: ctab, no inc wob ABD: soft, +bs Affect appropriate.  Speech and judgment appear normal. No tremor.

## 2018-01-17 NOTE — Assessment & Plan Note (Addendum)
Would try to gradually add on Lexapro while slowly decreasing Wellbutrin.  I do not want to make more than one change at a time.  Rationale discussed with patient.  He agrees.  No suicidal homicidal intent.  Okay for outpatient follow-up.  Plan is as follows-  Add on lexapro 5mg  a day for 1 month.  Continue 3 wellbutrin a day for now.   Then cut wellbutrin back to 1 pill twice a day for 2 weeks while on 5 mg of Lexapro a day.  If tolerated, then increase the lexapro to 10mg  a day for 1 month, while continuing 1 Wellbutrin twice a day.  Then cut wellbutrin back to 1 pill once a day for 2 weeks.   I would greatly appreciate an update in a few weeks and also at any point in the transition if/when he had concerns.   He agrees with plan.  Written copy of plan given to patient. >25 minutes spent in face to face time with patient, >50% spent in counselling or coordination of care.   I am comfortable with this plan, as is the patient.  We talked about the potential concurrent use of SSRIs and Wellbutrin.  It may be the case that he ends up needing 10 mg of Lexapro, potentially higher, with a low dose of Wellbutrin concurrently.  If this is not helping the patient significantly we can always refer him to psychiatry.  Offered that today but he wanted to go ahead with the plan as described above.  I think that is reasonable.

## 2018-01-20 ENCOUNTER — Ambulatory Visit: Payer: BLUE CROSS/BLUE SHIELD | Admitting: Physical Therapy

## 2018-01-20 DIAGNOSIS — R29898 Other symptoms and signs involving the musculoskeletal system: Secondary | ICD-10-CM

## 2018-01-20 DIAGNOSIS — R279 Unspecified lack of coordination: Secondary | ICD-10-CM

## 2018-01-20 NOTE — Therapy (Signed)
[place] MAIN [facility] SERVICES [address] [place], [place], [address] Phone: [phone]   Fax:  [phone]  Physical Therapy Treatment  Patient Details  Name: Joseph Drake MRN: [id number] Date of Birth: 1960/07/05 Referring Provider: Erlene Quan MD   Encounter Date: 01/20/2018  PT End of Session - 01/20/18 1557    Visit Number  2    Number of Visits  12    Date for PT Re-Evaluation  04/08/18    PT Start Time  8850    PT Stop Time  1600    PT Time Calculation (min)  53 min    Activity Tolerance  Patient tolerated treatment well;No increased pain       Past Medical History:  Diagnosis Date  . Acid reflux   . Allergy   . BPH (benign prostatic hypertrophy)   . Candida infection   . Dyspnea   . ED (erectile dysfunction)   . Genital herpes   . History of continuous positive airway pressure (CPAP) therapy   . HTN (hypertension)   . Hydronephrosis   . Hyperlipidemia   . Kidney stone on left side   . LVH (left ventricular hypertrophy)   . Obesity   . Palpitation    Holter in May 2014 with NSR, PVC, rare PACs  . Palpitation   . Sleep apnea   . Testosterone deficiency     Past Surgical History:  Procedure Laterality Date  . CIRCUMCISION    . NASAL SEPTUM SURGERY      There were no vitals filed for this visit.  Subjective Assessment - 01/20/18 1511    Subjective  Pt has been practicing his stretches at home and work.      Pertinent History  Pt has been wearing a CPAP for 8 years for OSA.  Pt has passed the kidney in Aug 10, 2014. Pt has had gout and an larged L ventricle of the heart. Pt has not gone to see his cardiologist since 2011-12.           [facility] PT Assessment - 01/20/18 1555      Palpation   Spinal mobility  increased hypmobility ( particular at upper thoracic) with increased mm tensions medial scapula      SI assessment   ASIS equal                   [facility] Adult PT Treatment/Exercise - 01/20/18 1556       Exercises   Exercises  --   see pt instructions     Moist Heat Therapy   Number Minutes Moist Heat  8 Minutes    Moist Heat Location  --   midback      Manual Therapy   Manual therapy comments  PA mobs along thoracic segments Grade III, STM at medial scapular mm                   PT Long Term Goals - 01/14/18 1913      PT LONG TERM GOAL #1   Title  Pt will decrease NIH-CPSI score from 60% to < 30 % in order to return urinary function     Time  12    Period  Weeks    Status  New    Target Date  04/08/18      PT LONG TERM GOAL #2   Title  Pt will report being able to relax and urinate in a public restroom of multiple stalls in  order to participate in community events    Time  5    Period  Weeks    Status  New    Target Date  02/18/18      PT LONG TERM GOAL #3   Title  Pt will demo increased spinal mobility in the thoracic area to optimize diaphragm and pelvic floor function     Time  4    Period  Weeks    Status  New    Target Date  03/25/18      PT LONG TERM GOAL #4   Title  --    Time  --    Period  --    Status  --    Target Date  02/11/18            Plan - 01/20/18 1557    Clinical Impression Statement  Pt demo'd equal alignment of his pelvic girdle but required manual Tx to continue decrease hypomobility of thoracic spine and mm tensions. Added mobility exercises into HEP and progressed with deep core strengthening. Educated pt to continue with flexibility routine at home and work. Pt continues to benefit from skilled PT.      Rehab Potential  Good    PT Frequency  1x / week    PT Duration  12 weeks    PT Treatment/Interventions  Patient/family education;Therapeutic activities;Therapeutic exercise;Balance training;Gait training;Moist Heat;Neuromuscular re-education;Functional mobility training;Aquatic Therapy;Manual techniques;Dry needling;Scar mobilization;Compression bandaging;Splinting;Manual lymph drainage;Energy conservation;Biofeedback     Consulted and Agree with Plan of Care  Patient       Patient will benefit from skilled therapeutic intervention in order to improve the following deficits and impairments:  Improper body mechanics, Postural dysfunction, Decreased scar mobility, Decreased mobility, Decreased coordination, Decreased endurance, Decreased range of motion, Decreased strength, Hypomobility, Difficulty walking, Decreased balance, Decreased safety awareness, Increased muscle spasms, Abnormal gait, Decreased activity tolerance, Increased fascial restricitons  Visit Diagnosis: Other symptoms and signs involving the musculoskeletal system  Unspecified lack of coordination     Problem List Patient Active Problem List   Diagnosis Date Noted  . Skin lesion 08/11/2017  . Toenail deformity 08/11/2017  . Anxiety 09/17/2016  . GERD (gastroesophageal reflux disease) 08/10/2016  . Rosacea 08/10/2016  . Laryngopharyngeal reflux (LPR) 06/09/2016  . Chronic pansinusitis 05/01/2016  . Hypertrophy, nasal, turbinate 05/01/2016  . Nasal congestion 04/03/2016  . Gout 01/16/2016  . Encounter for screening examination for infectious disease 01/16/2016  . Left foot pain 01/12/2016  . BPH with obstruction/lower urinary tract symptoms 09/21/2015  . 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 Encounter for general adult medical examination with abnormal findings 07/09/2015  . Advance care planning 07/09/2015  . Knee pain 07/09/2015  . Essential hypertension 04/23/2010  . Obstructive sleep apnea 03/13/2010  . VENTRICULAR HYPERTROPHY, LEFT 03/13/2010  . DYSPNEA ON EXERTION 01/11/2010  . CANDIDIASIS, UROGENITAL 10/12/2009  . Genital herpes 11/27/2008  . HYPERSOMNIA, ASSOCIATED WITH SLEEP APNEA 08/29/2008  . ALLERGIC RHINITIS 06/27/2008  . TESTOSTERONE DEFICIENCY 01/28/2008  . ERECTILE DYSFUNCTION 01/10/2008  . HLD (hyperlipidemia) 09/23/2006    Jerl Mina ,PT, DPT, E-RYT  01/20/2018, 4:04 PM  [place] [facility] SERVICES [address] [place], [place], [address] Phone: [phone]   Fax:  [phone]  Name: Joseph Drake MRN: [id number] Date of Birth: [birth year]

## 2018-01-26 ENCOUNTER — Ambulatory Visit: Payer: BLUE CROSS/BLUE SHIELD | Attending: Urology | Admitting: Physical Therapy

## 2018-01-26 DIAGNOSIS — R279 Unspecified lack of coordination: Secondary | ICD-10-CM | POA: Diagnosis present

## 2018-01-26 DIAGNOSIS — R29898 Other symptoms and signs involving the musculoskeletal system: Secondary | ICD-10-CM | POA: Insufficient documentation

## 2018-01-26 NOTE — Patient Instructions (Addendum)
This week: Bring a tumbler or stainless steel bottle for water in the morning at work.  And refill for the afternoon.   _______   Follow up with Dr. Damita Dunnings on another sleep study because you have not had an updated sleep study in 8 years.  The settings on the machine may need to be changed.

## 2018-01-26 NOTE — Therapy (Signed)
[place] MAIN [facility] SERVICES [address] [place], [place], [address] Phone: [phone]   Fax:  [phone]  Physical Therapy Treatment  Patient Details  Name: Joseph Drake MRN: [id number] Date of Birth: 1960/05/15 Referring Provider (PT): Erlene Quan MD   Encounter Date: 01/26/2018  PT End of Session - 01/26/18 1515    Visit Number  3    Number of Visits  12    Date for PT Re-Evaluation  04/08/18    PT Start Time  1510    PT Stop Time  1605    PT Time Calculation (min)  55 min    Activity Tolerance  Patient tolerated treatment well;No increased pain       Past Medical History:  Diagnosis Date  . Acid reflux   . Allergy   . BPH (benign prostatic hypertrophy)   . Candida infection   . Dyspnea   . ED (erectile dysfunction)   . Genital herpes   . History of continuous positive airway pressure (CPAP) therapy   . HTN (hypertension)   . Hydronephrosis   . Hyperlipidemia   . Kidney stone on left side   . LVH (left ventricular hypertrophy)   . Obesity   . Palpitation    Holter in May 2014 with NSR, PVC, rare PACs  . Palpitation   . Sleep apnea   . Testosterone deficiency     Past Surgical History:  Procedure Laterality Date  . CIRCUMCISION    . NASAL SEPTUM SURGERY      There were no vitals filed for this visit.  Subjective Assessment - 01/26/18 1516    Subjective  Pt reports he has been able to perform his exercises.      Pertinent History  Pt has been wearing a CPAP for 8 years for OSA.  Pt has passed the kidney in 08/07/2014. Pt has had gout and an larged L ventricle of the heart. Pt has not gone to see his cardiologist since 2011-12.           [facility] PT Assessment - 01/26/18 1557      Palpation   Spinal mobility  T12-L2 region with hypomobility, tightness, paraspinals R > L, medial scapular region       SI assessment   ASIS equal                   [facility] Adult PT Treatment/Exercise - 01/26/18 1558      Exercises   Exercises  --   see pt instructions     Moist Heat Therapy   Number Minutes Moist Heat  10 Minutes    Moist Heat Location  Other (comment)   with exercises     Manual Therapy   Manual therapy comments  STM/ MWM at mm listed in assessment, Grade II mob along throacic segments, medial glides R side along facets,                   PT Long Term Goals - 01/14/18 1913      PT LONG TERM GOAL #1   Title  Pt will decrease NIH-CPSI score from 60% to < 30 % in order to return urinary function     Time  12    Period  Weeks    Status  New    Target Date  04/08/18      PT LONG TERM GOAL #2   Title  Pt will report being able to relax and urinate  in a public restroom of multiple stalls in order to participate in community events    Time  5    Period  Weeks    Status  New    Target Date  02/18/18      PT LONG TERM GOAL #3   Title  Pt will demo increased spinal mobility in the thoracic area to optimize diaphragm and pelvic floor function     Time  4    Period  Weeks    Status  New    Target Date  03/25/18      PT LONG TERM GOAL #4   Title  --    Time  --    Period  --    Status  --    Target Date  02/11/18            Plan - 01/26/18 1600    Clinical Impression Statement  Pt tolerated manual Tx without complaints. Addressed further with manual Tx to decrease hypomobility and mm tightness at thoracic / lumbar junction to facilitate lateral / posterior excursion and depression of diaphragm. Provided education relationship of diaphragm on parasympathetic nn system and relaxation of pelvic floor mm,. Educated pt to increase water intake to 2 ( tumblers/ stainless steel bottle) per day.  Pt continse to benefit from skilled PT.     Rehab Potential  Good    PT Frequency  1x / week    PT Duration  12 weeks    PT Treatment/Interventions  Patient/family education;Therapeutic activities;Therapeutic exercise;Balance training;Gait training;Moist Heat;Neuromuscular  re-education;Functional mobility training;Aquatic Therapy;Manual techniques;Dry needling;Scar mobilization;Compression bandaging;Splinting;Manual lymph drainage;Energy conservation;Biofeedback    Consulted and Agree with Plan of Care  Patient       Patient will benefit from skilled therapeutic intervention in order to improve the following deficits and impairments:  Improper body mechanics, Postural dysfunction, Decreased scar mobility, Decreased mobility, Decreased coordination, Decreased endurance, Decreased range of motion, Decreased strength, Hypomobility, Difficulty walking, Decreased balance, Decreased safety awareness, Increased muscle spasms, Abnormal gait, Decreased activity tolerance, Increased fascial restricitons  Visit Diagnosis: Other symptoms and signs involving the musculoskeletal system  Unspecified lack of coordination     Problem List Patient Active Problem List   Diagnosis Date Noted  . Skin lesion 08/11/2017  . Toenail deformity 08/11/2017  . Anxiety 09/17/2016  . GERD (gastroesophageal reflux disease) 08/10/2016  . Rosacea 08/10/2016  . Laryngopharyngeal reflux (LPR) 06/09/2016  . Chronic pansinusitis 05/01/2016  . Hypertrophy, nasal, turbinate 05/01/2016  . Nasal congestion 04/03/2016  . Gout 01/16/2016  . Encounter for screening examination for infectious disease 01/16/2016  . Left foot pain 01/12/2016  . BPH with obstruction/lower urinary tract symptoms 09/21/2015  . 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 Encounter for general adult medical examination with abnormal findings 07/09/2015  . Advance care planning 07/09/2015  . Knee pain 07/09/2015  . Essential hypertension 04/23/2010  . Obstructive sleep apnea 03/13/2010  . VENTRICULAR HYPERTROPHY, LEFT 03/13/2010  . DYSPNEA ON EXERTION 01/11/2010  . CANDIDIASIS, UROGENITAL 10/12/2009  . Genital herpes 11/27/2008  . HYPERSOMNIA, ASSOCIATED WITH SLEEP APNEA 08/29/2008  . ALLERGIC RHINITIS  06/27/2008  . TESTOSTERONE DEFICIENCY 01/28/2008  . ERECTILE DYSFUNCTION 01/10/2008  . HLD (hyperlipidemia) 09/23/2006    Jerl Mina ,PT, DPT, E-RYT  01/26/2018, 4:03 PM  [place] MAIN [facility] SERVICES [address] [place], [place], [address] Phone: [phone]   Fax:  [phone]  Name: Gervis Gaba MRN: [id number] Date of Birth: 08/09/1960

## 2018-02-01 ENCOUNTER — Ambulatory Visit: Payer: BLUE CROSS/BLUE SHIELD | Admitting: Psychology

## 2018-02-01 ENCOUNTER — Ambulatory Visit: Payer: BLUE CROSS/BLUE SHIELD | Attending: Urology | Admitting: Physical Therapy

## 2018-02-01 DIAGNOSIS — M791 Myalgia, unspecified site: Secondary | ICD-10-CM | POA: Insufficient documentation

## 2018-02-01 DIAGNOSIS — R279 Unspecified lack of coordination: Secondary | ICD-10-CM

## 2018-02-01 DIAGNOSIS — R29898 Other symptoms and signs involving the musculoskeletal system: Secondary | ICD-10-CM

## 2018-02-01 NOTE — Therapy (Signed)
[place] MAIN [facility] SERVICES [address] [place], [place], [address] Phone: [phone]   Fax:  [phone]  Physical Therapy Treatment  Patient Details  Name: Joseph Drake MRN: [id number] Date of Birth: 06/15/1960 Referring Provider (PT): Erlene Quan MD   Encounter Date: 02/01/2018  PT End of Session - 02/01/18 08/14/10    Visit Number  4    Number of Visits  12    Date for PT Re-Evaluation  04/08/18    PT Start Time  1004    PT Stop Time  1056    PT Time Calculation (min)  52 min    Activity Tolerance  Patient tolerated treatment well;No increased pain       Past Medical History:  Diagnosis Date  . Acid reflux   . Allergy   . BPH (benign prostatic hypertrophy)   . Candida infection   . Dyspnea   . ED (erectile dysfunction)   . Genital herpes   . History of continuous positive airway pressure (CPAP) therapy   . HTN (hypertension)   . Hydronephrosis   . Hyperlipidemia   . Kidney stone on left side   . LVH (left ventricular hypertrophy)   . Obesity   . Palpitation    Holter in May 2014 with NSR, PVC, rare PACs  . Palpitation   . Sleep apnea   . Testosterone deficiency     Past Surgical History:  Procedure Laterality Date  . CIRCUMCISION    . NASAL SEPTUM SURGERY      There were no vitals filed for this visit.  Subjective Assessment - 02/01/18 1007    Subjective  Pt reports he notices his right side by his shoulder blade, there is still tighness     Pertinent History  Pt has been wearing a CPAP for 8 years for OSA.  Pt has passed the kidney in Aug 14, 2014. Pt has had gout and an larged L ventricle of the heart. Pt has not gone to see his cardiologist since 2011-12.           [facility] PT Assessment - 02/01/18 08/13/01      Palpation   Spinal mobility  Grade III T5-7 R medial facet, increased mm tightness at region medial to scapula    Palpation comment                       [place] Adult PT Treatment/Exercise - 02/01/18  08/13/2056      Neuro Re-ed    Neuro Re-ed Details   guided with visual and tactile cues for the coordination of diaphragmatic expansion/ pelvic floor lengthening with inhalation      Moist Heat Therapy   Number Minutes Moist Heat  4 Minutes    Moist Heat Location  --   thoracic spine     Manual Therapy   Manual therapy comments  PA mobs Grade III T5-7 R medial facet, STM/ MWM at mm medial to scapula                    PT Long Term Goals - 01/14/18 1913      PT LONG TERM GOAL #1   Title  Pt will decrease NIH-CPSI score from 60% to < 30 % in order to return urinary function     Time  12    Period  Weeks    Status  New    Target Date  04/08/18      PT  LONG TERM GOAL #2   Title  Pt will report being able to relax and urinate in a public restroom of multiple stalls in order to participate in community events    Time  5    Period  Weeks    Status  New    Target Date  02/18/18      PT LONG TERM GOAL #3   Title  Pt will demo increased spinal mobility in the thoracic area to optimize diaphragm and pelvic floor function     Time  4    Period  Weeks    Status  New    Target Date  03/25/18      PT LONG TERM GOAL #4   Title  --    Time  --    Period  --    Status  --    Target Date  02/11/18            Plan - 02/01/18 2112    Clinical Impression Statement  Pt demo'd signficantly less paraspinal mm tensions. Addressed the remaining tightness at medial side of R by scapula which pt tolerated without issues. Pt demo'd signficantly improve diaphragmatic excursion post Tx and required tactile and visual cues to coordinate diaphragm and pelvic floor ROM.  Pt is progressing well and is learning to relax his pelvic floor mm with each session. Pt continues to benefit from skilled PT.     Rehab Potential  Good    PT Frequency  1x / week    PT Duration  12 weeks    PT Treatment/Interventions  Patient/family education;Therapeutic activities;Therapeutic exercise;Balance  training;Gait training;Moist Heat;Neuromuscular re-education;Functional mobility training;Aquatic Therapy;Manual techniques;Dry needling;Scar mobilization;Compression bandaging;Splinting;Manual lymph drainage;Energy conservation;Biofeedback    Consulted and Agree with Plan of Care  Patient       Patient will benefit from skilled therapeutic intervention in order to improve the following deficits and impairments:  Improper body mechanics, Postural dysfunction, Decreased scar mobility, Decreased mobility, Decreased coordination, Decreased endurance, Decreased range of motion, Decreased strength, Hypomobility, Difficulty walking, Decreased balance, Decreased safety awareness, Increased muscle spasms, Abnormal gait, Decreased activity tolerance, Increased fascial restricitons  Visit Diagnosis: Other symptoms and signs involving the musculoskeletal system  Unspecified lack of coordination     Problem List Patient Active Problem List   Diagnosis Date Noted  . Skin lesion 08/11/2017  . Toenail deformity 08/11/2017  . Anxiety 09/17/2016  . GERD (gastroesophageal reflux disease) 08/10/2016  . Rosacea 08/10/2016  . Laryngopharyngeal reflux (LPR) 06/09/2016  . Chronic pansinusitis 05/01/2016  . Hypertrophy, nasal, turbinate 05/01/2016  . Nasal congestion 04/03/2016  . Gout 01/16/2016  . Encounter for screening examination for infectious disease 01/16/2016  . Left foot pain 01/12/2016  . BPH with obstruction/lower urinary tract symptoms 09/21/2015  . 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 Encounter for general adult medical examination with abnormal findings 07/09/2015  . Advance care planning 07/09/2015  . Knee pain 07/09/2015  . Essential hypertension 04/23/2010  . Obstructive sleep apnea 03/13/2010  . VENTRICULAR HYPERTROPHY, LEFT 03/13/2010  . DYSPNEA ON EXERTION 01/11/2010  . CANDIDIASIS, UROGENITAL 10/12/2009  . Genital herpes 11/27/2008  . HYPERSOMNIA, ASSOCIATED  WITH SLEEP APNEA 08/29/2008  . ALLERGIC RHINITIS 06/27/2008  . TESTOSTERONE DEFICIENCY 01/28/2008  . ERECTILE DYSFUNCTION 01/10/2008  . HLD (hyperlipidemia) 09/23/2006    Jerl Mina ,PT, DPT, E-RYT  02/01/2018, 9:21 PM  [place] MAIN [facility] SERVICES [address] [place], [place], [address] Phone: [phone]  Fax:  [phone]  Name: Joseph Drake MRN: [id number] Date of Birth: 1960-09-16

## 2018-02-04 ENCOUNTER — Encounter: Payer: Self-pay | Admitting: Physical Therapy

## 2018-02-08 ENCOUNTER — Ambulatory Visit: Payer: BLUE CROSS/BLUE SHIELD | Admitting: Psychology

## 2018-02-08 DIAGNOSIS — F411 Generalized anxiety disorder: Secondary | ICD-10-CM

## 2018-02-11 ENCOUNTER — Ambulatory Visit: Payer: BLUE CROSS/BLUE SHIELD | Admitting: Physical Therapy

## 2018-02-11 DIAGNOSIS — R279 Unspecified lack of coordination: Secondary | ICD-10-CM

## 2018-02-11 DIAGNOSIS — R29898 Other symptoms and signs involving the musculoskeletal system: Secondary | ICD-10-CM

## 2018-02-11 NOTE — Patient Instructions (Signed)
    Stretch Pelvic floor  1)  "v heels slide away and then back toward buttocks and then rock knee to slight ,  slide heel along at 11 o clock away from buttocks   10 reps    2) Figure-4 ( crossing ankle over opp thigh with knee bent, foot on bed  5 reps   3) cross thigh over thigh  5 reps     4) Happy baby pose Knees are towards chest and armpit direction, hands holding back of thigh "like baby getting their diaper changed" 5 reps    ___   Body scan nightly 3 cycles ( emailed audio)

## 2018-02-12 NOTE — Therapy (Signed)
[place] MAIN [facility] SERVICES [address] [place], [place], [address] Phone: [phone]   Fax:  [phone]  Physical Therapy Treatment  Patient Details  Name: Joseph Drake MRN: [id number] Date of Birth: Dec 03, 1960 Referring Provider (PT): Erlene Quan MD   Encounter Date: 02/11/2018  PT End of Session - 02/11/18 1255    Visit Number  5    Number of Visits  12    Date for PT Re-Evaluation  04/08/18    PT Start Time  26-Jul-1203    PT Stop Time  1255    PT Time Calculation (min)  50 min    Activity Tolerance  Patient tolerated treatment well;No increased pain       Past Medical History:  Diagnosis Date  . Acid reflux   . Allergy   . BPH (benign prostatic hypertrophy)   . Candida infection   . Dyspnea   . ED (erectile dysfunction)   . Genital herpes   . History of continuous positive airway pressure (CPAP) therapy   . HTN (hypertension)   . Hydronephrosis   . Hyperlipidemia   . Kidney stone on left side   . LVH (left ventricular hypertrophy)   . Obesity   . Palpitation    Holter in May 2014 with NSR, PVC, rare PACs  . Palpitation   . Sleep apnea   . Testosterone deficiency     Past Surgical History:  Procedure Laterality Date  . CIRCUMCISION    . NASAL SEPTUM SURGERY      There were no vitals filed for this visit.  Subjective Assessment - 02/11/18 1249    Subjective  Pt reoprted he has been sitting down to relax and urinate. Once the stream starts, he notices the muscles start to tighten back up    Pertinent History  Pt has been wearing a CPAP for 8 years for OSA.  Pt has passed the kidney in July 26, 2014. Pt has had gout and an larged L ventricle of the heart. Pt has not gone to see his cardiologist since 2011-12.           [facility] PT Assessment - 02/11/18 1256      Palpation   SI assessment   distal ab wall with fascial tensions suprapubic/groin                  Pelvic Floor Special Questions - 02/11/18 1250    External Perineal Exam  through undergarments, pt declined intrarectal exam today     External Palpation  R tightness only , deeper  anterior muscles,  ischiocavernosus, bulbospongiosus, deep transverse perineal  ( decreased post Tx)         [facility] Adult PT Treatment/Exercise - 02/11/18 1252      Neuro Re-ed    Neuro Re-ed Details   see pt instructions      Moist Heat Therapy   Number Minutes Moist Heat  6 Minutes    Moist Heat Location  Other (comment)   ab, perineum through sheets     Manual Therapy   Manual therapy comments  STM/MWM at probelm area noted in assessment    abdominal/ suprapubic fascial release with MWM                  PT Long Term Goals - 01/14/18 1913      PT LONG TERM GOAL #1   Title  Pt will decrease NIH-CPSI score from 60% to < 30 % in order to return  urinary function     Time  12    Period  Weeks    Status  New    Target Date  04/08/18      PT LONG TERM GOAL #2   Title  Pt will report being able to relax and urinate in a public restroom of multiple stalls in order to participate in community events    Time  5    Period  Weeks    Status  New    Target Date  02/18/18      PT LONG TERM GOAL #3   Title  Pt will demo increased spinal mobility in the thoracic area to optimize diaphragm and pelvic floor function     Time  4    Period  Weeks    Status  New    Target Date  03/25/18      PT LONG TERM GOAL #4   Title  --    Time  --    Period  --    Status  --    Target Date  02/11/18            Plan - 02/11/18 1255    Clinical Impression Statement  Pt demo'd decreased fascial tensions over low abdominal/ suprapubic/ anterior pubic area after manual Tx. Pt demo'd increased pelvic lengthening which will likely help pt continue relaxing to have less difficulty with urinating.  Pt continues to benefit from skilled PT.   Rehab Potential  Good    PT Frequency  1x / week    PT Duration  12 weeks    PT Treatment/Interventions   Patient/family education;Therapeutic activities;Therapeutic exercise;Balance training;Gait training;Moist Heat;Neuromuscular re-education;Functional mobility training;Aquatic Therapy;Manual techniques;Dry needling;Scar mobilization;Compression bandaging;Splinting;Manual lymph drainage;Energy conservation;Biofeedback    Consulted and Agree with Plan of Care  Patient       Patient will benefit from skilled therapeutic intervention in order to improve the following deficits and impairments:  Improper body mechanics, Postural dysfunction, Decreased scar mobility, Decreased mobility, Decreased coordination, Decreased endurance, Decreased range of motion, Decreased strength, Hypomobility, Difficulty walking, Decreased balance, Decreased safety awareness, Increased muscle spasms, Abnormal gait, Decreased activity tolerance, Increased fascial restricitons  Visit Diagnosis: Other symptoms and signs involving the musculoskeletal system  Unspecified lack of coordination     Problem List Patient Active Problem List   Diagnosis Date Noted  . Skin lesion 08/11/2017  . Toenail deformity 08/11/2017  . Anxiety 09/17/2016  . GERD (gastroesophageal reflux disease) 08/10/2016  . Rosacea 08/10/2016  . Laryngopharyngeal reflux (LPR) 06/09/2016  . Chronic pansinusitis 05/01/2016  . Hypertrophy, nasal, turbinate 05/01/2016  . Nasal congestion 04/03/2016  . Gout 01/16/2016  . Encounter for screening examination for infectious disease 01/16/2016  . Left foot pain 01/12/2016  . BPH with obstruction/lower urinary tract symptoms 09/21/2015  . 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 Encounter for general adult medical examination with abnormal findings 07/09/2015  . Advance care planning 07/09/2015  . Knee pain 07/09/2015  . Essential hypertension 04/23/2010  . Obstructive sleep apnea 03/13/2010  . VENTRICULAR HYPERTROPHY, LEFT 03/13/2010  . DYSPNEA ON EXERTION 01/11/2010  . CANDIDIASIS,  UROGENITAL 10/12/2009  . Genital herpes 11/27/2008  . HYPERSOMNIA, ASSOCIATED WITH SLEEP APNEA 08/29/2008  . ALLERGIC RHINITIS 06/27/2008  . TESTOSTERONE DEFICIENCY 01/28/2008  . ERECTILE DYSFUNCTION 01/10/2008  . HLD (hyperlipidemia) 09/23/2006    Jerl Mina ,PT, DPT, E-RYT  02/12/2018, 7:38 PM  [place] MAIN [facility] SERVICES [place], [place],  [place] Phone: [phone]   Fax:  [phone]  Name: Joseph Drake MRN: [id number] Date of Birth: Jul 09, 1960

## 2018-02-17 ENCOUNTER — Encounter: Payer: Self-pay | Admitting: Urology

## 2018-02-17 ENCOUNTER — Ambulatory Visit: Payer: BLUE CROSS/BLUE SHIELD | Admitting: Physical Therapy

## 2018-02-17 DIAGNOSIS — R279 Unspecified lack of coordination: Secondary | ICD-10-CM

## 2018-02-17 DIAGNOSIS — R29898 Other symptoms and signs involving the musculoskeletal system: Secondary | ICD-10-CM | POA: Diagnosis not present

## 2018-02-17 NOTE — Patient Instructions (Signed)
Work stretches to do at work breaks and after walking  - hip flexor , R foot on chair, rocking hips 10 reps, switch sides - same position as hip flexor, but turn navel 45 deg to L, , track R knee along 2-3 rd foot, rocking hips 10 reps  -same position, park R knee above ankle, rest R forearm on thigh, more weight on L back foot, backward elbow rolls   ___  Seated stretches:  figure 4 when placing shoes on or seated  turned 45 deg in seated, ( lunge)   ____  Side stepping with hip width apart hallway 4 x both directions

## 2018-02-17 NOTE — Therapy (Signed)
[place] MAIN [facility] SERVICES [address] [place], [place], [address] Phone: [phone]   Fax:  [phone]  Physical Therapy Treatment  Patient Details  Name: Joseph Drake MRN: [id number] Date of Birth: 01-25-1961 Referring Provider (PT): Erlene Quan MD   Encounter Date: 02/17/2018  PT End of Session - 02/17/18 1647    Visit Number  6    Number of Visits  12    Date for PT Re-Evaluation  04/08/18    PT Start Time  1600    PT Stop Time  1630    PT Time Calculation (min)  30 min    Activity Tolerance  Patient tolerated treatment well;No increased pain       Past Medical History:  Diagnosis Date  . Acid reflux   . Allergy   . BPH (benign prostatic hypertrophy)   . Candida infection   . Dyspnea   . ED (erectile dysfunction)   . Genital herpes   . History of continuous positive airway pressure (CPAP) therapy   . HTN (hypertension)   . Hydronephrosis   . Hyperlipidemia   . Kidney stone on left side   . LVH (left ventricular hypertrophy)   . Obesity   . Palpitation    Holter in May 2014 with NSR, PVC, rare PACs  . Palpitation   . Sleep apnea   . Testosterone deficiency     Past Surgical History:  Procedure Laterality Date  . CIRCUMCISION    . NASAL SEPTUM SURGERY      There were no vitals filed for this visit.  Subjective Assessment - 02/17/18 1605    Subjective  Pt noticed today  his muscles did not clamp up with urination.      Pertinent History  Pt has been wearing a CPAP for 8 years for OSA.  Pt has passed the kidney in 07/25/2014. Pt has had gout and an larged L ventricle of the heart. Pt has not gone to see his cardiologist since 2011-12.           [facility] PT Assessment - 02/17/18 1606      Palpation   Palpation comment  slight tightness at deep transverse perineal B ( decreased post Tx)                    [facility] Adult PT Treatment/Exercise - 02/17/18 1622      Neuro Re-ed    Neuro Re-ed Details   see  pt instructions      Exercises   Exercises  --   see pt instructions     Manual Therapy   Manual therapy comments  STM/MWM at probelm area noted in assessment                   PT Long Term Goals - 01/14/18 1913      PT LONG TERM GOAL #1   Title  Pt will decrease NIH-CPSI score from 60% to < 30 % in order to return urinary function     Time  12    Period  Weeks    Status  New    Target Date  04/08/18      PT LONG TERM GOAL #2   Title  Pt will report being able to relax and urinate in a public restroom of multiple stalls in order to participate in community events    Time  5    Period  Weeks    Status  New  Target Date  02/18/18      PT LONG TERM GOAL #3   Title  Pt will demo increased spinal mobility in the thoracic area to optimize diaphragm and pelvic floor function     Time  4    Period  Weeks    Status  New    Target Date  03/25/18      PT LONG TERM GOAL #4   Title  --    Time  --    Period  --    Status  --    Target Date  02/11/18            Plan - 02/17/18 2029    Clinical Impression Statement  Pt has noticed decrease tightening of pelvic floor mm with urination this week.  Pt demo'd decreased anterior pelvic floor tightness. Applied manual Tx externally to decrease remaining tightness at R pubic tubercle. Added stretches of adductors, quads, hip flexors into work stretches to compliment periods of sitting. Pt is making progress twoards goals with skilled PT.    Rehab Potential  Good    PT Frequency  1x / week    PT Duration  12 weeks    PT Treatment/Interventions  Patient/family education;Therapeutic activities;Therapeutic exercise;Balance training;Gait training;Moist Heat;Neuromuscular re-education;Functional mobility training;Aquatic Therapy;Manual techniques;Dry needling;Scar mobilization;Compression bandaging;Splinting;Manual lymph drainage;Energy conservation;Biofeedback    Consulted and Agree with Plan of Care  Patient       Patient  will benefit from skilled therapeutic intervention in order to improve the following deficits and impairments:  Improper body mechanics, Postural dysfunction, Decreased scar mobility, Decreased mobility, Decreased coordination, Decreased endurance, Decreased range of motion, Decreased strength, Hypomobility, Difficulty walking, Decreased balance, Decreased safety awareness, Increased muscle spasms, Abnormal gait, Decreased activity tolerance, Increased fascial restricitons  Visit Diagnosis: Other symptoms and signs involving the musculoskeletal system  Unspecified lack of coordination     Problem List Patient Active Problem List   Diagnosis Date Noted  . Skin lesion 08/11/2017  . Toenail deformity 08/11/2017  . Anxiety 09/17/2016  . GERD (gastroesophageal reflux disease) 08/10/2016  . Rosacea 08/10/2016  . Laryngopharyngeal reflux (LPR) 06/09/2016  . Chronic pansinusitis 05/01/2016  . Hypertrophy, nasal, turbinate 05/01/2016  . Nasal congestion 04/03/2016  . Gout 01/16/2016  . Encounter for screening examination for infectious disease 01/16/2016  . Left foot pain 01/12/2016  . BPH with obstruction/lower urinary tract symptoms 09/21/2015  . 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 Encounter for general adult medical examination with abnormal findings 07/09/2015  . Advance care planning 07/09/2015  . Knee pain 07/09/2015  . Essential hypertension 04/23/2010  . Obstructive sleep apnea 03/13/2010  . VENTRICULAR HYPERTROPHY, LEFT 03/13/2010  . DYSPNEA ON EXERTION 01/11/2010  . CANDIDIASIS, UROGENITAL 10/12/2009  . Genital herpes 11/27/2008  . HYPERSOMNIA, ASSOCIATED WITH SLEEP APNEA 08/29/2008  . ALLERGIC RHINITIS 06/27/2008  . TESTOSTERONE DEFICIENCY 01/28/2008  . ERECTILE DYSFUNCTION 01/10/2008  . HLD (hyperlipidemia) 09/23/2006    Jerl Mina ,PT, DPT, E-RYT  02/17/2018, 8:31 PM  [place] MAIN [facility] SERVICES [address] [place], [place], [address] Phone: [phone]   Fax:  [phone]  Name: Joseph Drake MRN: [id number] Date of Birth: Sep 28, 1960

## 2018-03-03 ENCOUNTER — Ambulatory Visit: Payer: BLUE CROSS/BLUE SHIELD | Attending: Urology | Admitting: Physical Therapy

## 2018-03-03 DIAGNOSIS — R279 Unspecified lack of coordination: Secondary | ICD-10-CM | POA: Diagnosis present

## 2018-03-03 DIAGNOSIS — R29898 Other symptoms and signs involving the musculoskeletal system: Secondary | ICD-10-CM | POA: Diagnosis not present

## 2018-03-03 NOTE — Patient Instructions (Signed)
[email or web address]  Look for tai chi    Video:   [email or web address]  [email or web address]  [email or web address]   ________ This week:  practiced relaxed standing: knee unlocked    practice with standing during urination   Add sidelying quad stretch into stretch routien  Use belt with happy baby    Standing quad stretch and ( ski track with knee bends)

## 2018-03-05 NOTE — Therapy (Signed)
[place] MAIN [facility] SERVICES [address] [place], [place], [address] Phone: [phone]   Fax:  [phone]  Physical Therapy Treatment  Patient Details  Name: Joseph Drake MRN: [id number] Date of Birth: Aug 22, 1960 Referring Provider (PT): Erlene Quan MD   Encounter Date: 03/03/2018  PT End of Session - 03/05/18 1247    Visit Number  7    Number of Visits  12    Date for PT Re-Evaluation  04/08/18    PT Start Time  5176    PT Stop Time  1648    PT Time Calculation (min)  45 min    Activity Tolerance  Patient tolerated treatment well;No increased pain       Past Medical History:  Diagnosis Date  . Acid reflux   . Allergy   . BPH (benign prostatic hypertrophy)   . Candida infection   . Dyspnea   . ED (erectile dysfunction)   . Genital herpes   . History of continuous positive airway pressure (CPAP) therapy   . HTN (hypertension)   . Hydronephrosis   . Hyperlipidemia   . Kidney stone on left side   . LVH (left ventricular hypertrophy)   . Obesity   . Palpitation    Holter in May 2014 with NSR, PVC, rare PACs  . Palpitation   . Sleep apnea   . Testosterone deficiency     Past Surgical History:  Procedure Laterality Date  . CIRCUMCISION    . NASAL SEPTUM SURGERY      There were no vitals filed for this visit.  Subjective Assessment - 03/05/18 1249    Subjective  Pt noticed pt is able to relax the muscles more during urination, more so when sitting than standing.      Pertinent History  Pt has been wearing a CPAP for 8 years for OSA.  Pt has passed the kidney in 30-Jul-2014. Pt has had gout and an larged L ventricle of the heart. Pt has not gone to see his cardiologist since 2011-12.                      Pelvic Floor Special Questions - 03/05/18 1249    External Perineal Exam  through undergarments, pt declined intrarectal exam today     External Palpation  increased tensions at deep transverse perineal,  ischiocavernosus B , suprapubic region         [facility] Adult PT Treatment/Exercise - 03/05/18 1248      Manual Therapy   Manual therapy comments  STM/MWM at problem area noted in assessment                   PT Long Term Goals - 01/14/18 1913      PT LONG TERM GOAL #1   Title  Pt will decrease NIH-CPSI score from 60% to < 30 % in order to return urinary function     Time  12    Period  Weeks    Status  New    Target Date  04/08/18      PT LONG TERM GOAL #2   Title  Pt will report being able to relax and urinate in a public restroom of multiple stalls in order to participate in community events    Time  5    Period  Weeks    Status  New    Target Date  02/18/18      PT LONG TERM GOAL #  3   Title  Pt will demo increased spinal mobility in the thoracic area to optimize diaphragm and pelvic floor function     Time  4    Period  Weeks    Status  New    Target Date  03/25/18      PT LONG TERM GOAL #4   Title  --    Time  --    Period  --    Status  --    Target Date  02/11/18            Plan - 03/05/18 1247    Clinical Impression Statement  Pt demo'd decreased anterior pelvic floor mm tightness but still required more manual Tx for deeper muscle release. Progressed pt to standing posture training and applying relaxed breathing to lengthen pelvic floor mm to improve urination in standing. Pt demo'd correctly. Pt continues to benefit from skilled PT.     Rehab Potential  Good    PT Frequency  1x / week    PT Duration  12 weeks    PT Treatment/Interventions  Patient/family education;Therapeutic activities;Therapeutic exercise;Balance training;Gait training;Moist Heat;Neuromuscular re-education;Functional mobility training;Aquatic Therapy;Manual techniques;Dry needling;Scar mobilization;Compression bandaging;Splinting;Manual lymph drainage;Energy conservation;Biofeedback    Consulted and Agree with Plan of Care  Patient       Patient will benefit from skilled  therapeutic intervention in order to improve the following deficits and impairments:  Improper body mechanics, Postural dysfunction, Decreased scar mobility, Decreased mobility, Decreased coordination, Decreased endurance, Decreased range of motion, Decreased strength, Hypomobility, Difficulty walking, Decreased balance, Decreased safety awareness, Increased muscle spasms, Abnormal gait, Decreased activity tolerance, Increased fascial restricitons  Visit Diagnosis: Other symptoms and signs involving the musculoskeletal system  Unspecified lack of coordination     Problem List Patient Active Problem List   Diagnosis Date Noted  . Skin lesion 08/11/2017  . Toenail deformity 08/11/2017  . Anxiety 09/17/2016  . GERD (gastroesophageal reflux disease) 08/10/2016  . Rosacea 08/10/2016  . Laryngopharyngeal reflux (LPR) 06/09/2016  . Chronic pansinusitis 05/01/2016  . Hypertrophy, nasal, turbinate 05/01/2016  . Nasal congestion 04/03/2016  . Gout 01/16/2016  . Encounter for screening examination for infectious disease 01/16/2016  . Left foot pain 01/12/2016  . BPH with obstruction/lower urinary tract symptoms 09/21/2015  . 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 Encounter for general adult medical examination with abnormal findings 07/09/2015  . Advance care planning 07/09/2015  . Knee pain 07/09/2015  . Essential hypertension 04/23/2010  . Obstructive sleep apnea 03/13/2010  . VENTRICULAR HYPERTROPHY, LEFT 03/13/2010  . DYSPNEA ON EXERTION 01/11/2010  . CANDIDIASIS, UROGENITAL 10/12/2009  . Genital herpes 11/27/2008  . HYPERSOMNIA, ASSOCIATED WITH SLEEP APNEA 08/29/2008  . ALLERGIC RHINITIS 06/27/2008  . TESTOSTERONE DEFICIENCY 01/28/2008  . ERECTILE DYSFUNCTION 01/10/2008  . HLD (hyperlipidemia) 09/23/2006    Jerl Mina ,PT, DPT, E-RYT  03/05/2018, 12:49 PM  [place] MAIN [facility] SERVICES [address] [place],  [place], [address] Phone: [phone]   Fax:  [phone]  Name: Lavan Imes MRN: [id number] Date of Birth: 10-09-1960

## 2018-03-08 ENCOUNTER — Ambulatory Visit (INDEPENDENT_AMBULATORY_CARE_PROVIDER_SITE_OTHER): Payer: BLUE CROSS/BLUE SHIELD | Admitting: Psychology

## 2018-03-08 DIAGNOSIS — F411 Generalized anxiety disorder: Secondary | ICD-10-CM | POA: Diagnosis not present

## 2018-03-10 ENCOUNTER — Other Ambulatory Visit: Payer: Self-pay | Admitting: Urology

## 2018-03-10 ENCOUNTER — Ambulatory Visit: Payer: BLUE CROSS/BLUE SHIELD | Admitting: Physical Therapy

## 2018-03-10 DIAGNOSIS — R29898 Other symptoms and signs involving the musculoskeletal system: Secondary | ICD-10-CM

## 2018-03-10 DIAGNOSIS — R279 Unspecified lack of coordination: Secondary | ICD-10-CM

## 2018-03-10 NOTE — Patient Instructions (Addendum)
half kneeling transitions to and from the floor  Add mini standing lunges with hand on dresser or table  20 x reps   Start with ski track stance, step back with heel/ toes turned straight, front knee above ankle without moving as your back knee bends and straightens  Keep pelvis and trunk centered between legs without shifting more to the front legs       Transition from standing to floor: Wide squat like you are about to pick something up from the floor --> crawl hand down on thigh and then reach other hand onto the ground (all fours)  To get up, all fours--> lifts hips in to Downward Facing Dog  and walk hands backwards to feet --> mini quat --> hands on thighs, then hips then pause HERE  to avoid (moving too quickly up/ blood rush) -->  knees glide forward and roll  Hips up instead of hinging spine up   * KEEP YOUR HEAD AND HEART LEVELLED , NEVER LETTING YOUR HEAD GET BELOW YOUR HEART     Transition from standing to floor if you have hypertension or can not have your head lower than your heart  Wide squat in front of a CHAIR  Forearms onto the chair Lower knees down into double kneeling   floor   To get up Walk on your knees to the front of chair again Forearms onto the chair Inhale, exhale, pushing onto the forearms to life  lifts hips up, kneeping knees bent hands on thighs, then hips then pause HERE  to avoid (moving too quickly up/ blood rush) -->  knees glide forward and roll  Hips up instead of hinging spine up   * KEEP YOUR HEAD AND HEART LEVELLED , NEVER LETTING YOUR HEAD GET BELOW YOUR HEART  _____    Yoga sequence standing:  See video   1) arms in V, shoulder down  2) mountain pose  3)   Extended side angle :  Feet are hip width apart, L foot one behind like you are on ski tracks,  R knee bent over ankle but not more forward then the ankle.  Make sure 50% weight is in the front foot/leg , 50% weight is the back foot/ leg    Rest R forearm lightly on  top of thigh,  3a)  Elbow circles  3b)  L hand on L hip.  Inhale lengthen spine,   Exhale turn navel to the L then the ribcage turns, look at the other wall.  Keep maintaining  50% weight is in the front foot/leg , 50% weight is the back foot/ leg  And make sure the front knee is still pointed in the toe line of the 2nd toe.   3 breaths here.    4)    REVERSE Warrior    :   Feet are hip width apart, R foot one behind like you are on ski tracks,  L knee bent over ankle but not more forward then the ankle.  Make sure 50% weight is in the front foot/leg , 50% weight is the back foot/ leg     R hand on L hip.  Inhale lengthen spine first and reach up with R    Exhale turn navel to the L then the ribcage turns, look at the other wall. Rest R hand on L thigh  Keep maintaining  50% weight is in the front foot/leg , 50% weight is the back foot/ leg  And make  sure the front knee is still pointed in the toe line of the 2nd toe.    3 breaths here.   _________  Yoga sequence on floor"    Transition from standing to floor: Wide squat like you are about to pick something up from the floor --> crawl hand down on thigh and then reach other hand onto the ground (all fours)  To get up, all fours--> lifts hips in to Downward Facing Dog  3 breaths   Double kneeling, arms over heard  Gate pose:  Start in half kneeling Hips are squared to the front, knees hip width apart, back toes tucked under Sidebend like a wall is behind you x 2 Sidebend like a wall is behind and then five high the sky (trunk rotation) x 2   Gate pose with rocking along diagonal line   _____  MAKE SURE TO ALWAYS END YOGA PRACTICE WITH CORPSE POSE ( [place])   Lengthened breathing   Body scan ( emailed)

## 2018-03-10 NOTE — Therapy (Addendum)
[place] MAIN [facility] SERVICES [address] [place], [place], [address] Phone: [phone]   Fax:  [phone]  Physical Therapy Treatment  Patient Details  Name: Joseph Drake MRN: [id number] Date of Birth: 1961-03-01 Referring Provider (PT): Erlene Quan MD   Encounter Date: 03/10/2018  PT End of Session - 03/10/18 1707    Visit Number  8    Number of Visits  12    Date for PT Re-Evaluation  04/08/18    PT Start Time  6962    PT Stop Time  9528    PT Time Calculation (min)  45 min    Activity Tolerance  Patient tolerated treatment well;No increased pain       Past Medical History:  Diagnosis Date  . Acid reflux   . Allergy   . BPH (benign prostatic hypertrophy)   . Candida infection   . Dyspnea   . ED (erectile dysfunction)   . Genital herpes   . History of continuous positive airway pressure (CPAP) therapy   . HTN (hypertension)   . Hydronephrosis   . Hyperlipidemia   . Kidney stone on left side   . LVH (left ventricular hypertrophy)   . Obesity   . Palpitation    Holter in May 2014 with NSR, PVC, rare PACs  . Palpitation   . Sleep apnea   . Testosterone deficiency     Past Surgical History:  Procedure Laterality Date  . CIRCUMCISION    . NASAL SEPTUM SURGERY      There were no vitals filed for this visit.  Subjective Assessment - 03/10/18 1703    Subjective  Pt reports sitting to urinate is better than standing. 50-60% of the time, pt finds the time to start urination once sitting is less than prior to PT. Once the flow starts, pt is getting an awareness to try and control the muscles from contracting .  Pt tries to stretches in , some days better than others. Pt has increased water intake, decreased soda to 1 per day.Pt notices when the public bathrooms are crowded, pt is more nervous to urinate .       Pertinent History  Pt has been wearing a CPAP for 8 years for OSA.  Pt has passed the kidney in 2014/08/20. Pt has had gout  and an larged L ventricle of the heart. Pt has not gone to see his cardiologist since 2011-12.           [facility] PT Assessment - 03/10/18 1748      Floor to Stand   Comments  half kneeling / poor alignment, downward facing dog with SOB . educated on slower transition to decrease HR ( 2/2/ L ventricular hypertrophy in medical Hx)                    [facility] Adult PT Treatment/Exercise - 03/10/18 1746      Therapeutic Activites    Therapeutic Activities  --    ( education on inflammation on BPH.nutrition/ relaxation)     Neuro Re-ed    Neuro Re-ed Details   see pt instructions ( alignment cues  )    transition from floor> stand slower to minimize increased HR                 PT Long Term Goals - 03/10/18 1713      PT LONG TERM GOAL #1   Title  Pt will decrease NIH-CPSI score from 60%  to < 30 % in order to return urinary function     Time  12    Period  Weeks    Status  On-going      PT LONG TERM GOAL #2   Title  Pt will report being able to relax and urinate in a public restroom of multiple stalls in order to participate in community events    Time  5    Period  Weeks    Status  Achieved      PT LONG TERM GOAL #3   Title  Pt will demo increased spinal mobility in the thoracic area to optimize diaphragm and pelvic floor function     Time  4    Period  Weeks    Status  Achieved            Plan - 03/10/18 1753    Clinical Impression Statement  Pt demo'd improved alignment with yoga poses in standing and on the floor. Pt demo'd increased SOB with transfer floor <> stand and was educated to slow transition to minimize strain on heart ( medical Hx of L ventricel hypertrophy) .  Pt's yoga poese were selected to optomize hip mobility and pelvic floor lengthening and back and thoracic mobility. Pt was educated  decreasing inflammation with nutrition consult and with relaxation. Pt was educatedon mindfulness technqiue. Pt reported feeling relaxed (Pt 's  medical Hx of anxiety) .  Pt remains compliant with increased water and decreased bladder irritants.  Pt continues to benefit from skilled PT and making progress with better urination and pelvic floor awareness.        Rehab Potential  Good    PT Frequency  1x / week    PT Duration  12 weeks    PT Treatment/Interventions  Patient/family education;Therapeutic activities;Therapeutic exercise;Balance training;Gait training;Moist Heat;Neuromuscular re-education;Functional mobility training;Aquatic Therapy;Manual techniques;Dry needling;Scar mobilization;Compression bandaging;Splinting;Manual lymph drainage;Energy conservation;Biofeedback    Consulted and Agree with Plan of Care  Patient       Patient will benefit from skilled therapeutic intervention in order to improve the following deficits and impairments:  Improper body mechanics, Postural dysfunction, Decreased scar mobility, Decreased mobility, Decreased coordination, Decreased endurance, Decreased range of motion, Decreased strength, Hypomobility, Difficulty walking, Decreased balance, Decreased safety awareness, Increased muscle spasms, Abnormal gait, Decreased activity tolerance, Increased fascial restricitons  Visit Diagnosis: Other symptoms and signs involving the musculoskeletal system  Unspecified lack of coordination     Problem List Patient Active Problem List   Diagnosis Date Noted  . Skin lesion 08/11/2017  . Toenail deformity 08/11/2017  . Anxiety 09/17/2016  . GERD (gastroesophageal reflux disease) 08/10/2016  . Rosacea 08/10/2016  . Laryngopharyngeal reflux (LPR) 06/09/2016  . Chronic pansinusitis 05/01/2016  . Hypertrophy, nasal, turbinate 05/01/2016  . Nasal congestion 04/03/2016  . Gout 01/16/2016  . Encounter for screening examination for infectious disease 01/16/2016  . Left foot pain 01/12/2016  . BPH with obstruction/lower urinary tract symptoms 09/21/2015  . 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 Encounter for general adult medical examination with abnormal findings 07/09/2015  . Advance care planning 07/09/2015  . Knee pain 07/09/2015  . Essential hypertension 04/23/2010  . Obstructive sleep apnea 03/13/2010  . VENTRICULAR HYPERTROPHY, LEFT 03/13/2010  . DYSPNEA ON EXERTION 01/11/2010  . CANDIDIASIS, UROGENITAL 10/12/2009  . Genital herpes 11/27/2008  . HYPERSOMNIA, ASSOCIATED WITH SLEEP APNEA 08/29/2008  . ALLERGIC RHINITIS 06/27/2008  . TESTOSTERONE DEFICIENCY 01/28/2008  . ERECTILE DYSFUNCTION 01/10/2008  . HLD (hyperlipidemia) 09/23/2006  Jerl Mina ,PT, DPT, E-RYT  03/11/2018, 5:06 PM  [place] MAIN [facility] SERVICES [address] [place], [place], [address] Phone: [phone]   Fax:  [phone]  Name: Joseph Drake MRN: [id number] Date of Birth: 1961/01/14

## 2018-03-17 ENCOUNTER — Encounter: Payer: Self-pay | Admitting: Family Medicine

## 2018-03-17 ENCOUNTER — Ambulatory Visit: Payer: BLUE CROSS/BLUE SHIELD | Admitting: Physical Therapy

## 2018-03-17 DIAGNOSIS — R279 Unspecified lack of coordination: Secondary | ICD-10-CM

## 2018-03-17 DIAGNOSIS — R29898 Other symptoms and signs involving the musculoskeletal system: Secondary | ICD-10-CM

## 2018-03-19 NOTE — Therapy (Signed)
[place] MAIN [facility] SERVICES [address] [place], [place], [address] Phone: [phone]   Fax:  [phone]  Physical Therapy Treatment  Patient Details  Name: Joseph Drake MRN: [id number] Date of Birth: 1960/10/08 Referring Provider (PT): Erlene Quan MD   Encounter Date: 03/17/2018  PT End of Session - 03/19/18 0807    Visit Number  9    Number of Visits  12    Date for PT Re-Evaluation  04/08/18    PT Start Time  1700    PT Stop Time  1733    PT Time Calculation (min)  33 min    Activity Tolerance  Patient tolerated treatment well;No increased pain       Past Medical History:  Diagnosis Date  . Acid reflux   . Allergy   . BPH (benign prostatic hypertrophy)   . Candida infection   . Dyspnea   . ED (erectile dysfunction)   . Genital herpes   . History of continuous positive airway pressure (CPAP) therapy   . HTN (hypertension)   . Hydronephrosis   . Hyperlipidemia   . Kidney stone on left side   . LVH (left ventricular hypertrophy)   . Obesity   . Palpitation    Holter in May 2014 with NSR, PVC, rare PACs  . Palpitation   . Sleep apnea   . Testosterone deficiency     Past Surgical History:  Procedure Laterality Date  . CIRCUMCISION    . NASAL SEPTUM SURGERY      There were no vitals filed for this visit.      [facility] PT Assessment - 03/19/18 0810      Observation/Other Assessments   Observations  no cued required for hyperextension of knees                    [facility] Adult PT Treatment/Exercise - 03/19/18 0810      Therapeutic Activites    Therapeutic Activities  --   reviewed yoga sequence with minor cues; guided relaxation                 PT Long Term Goals - 03/10/18 1713      PT LONG TERM GOAL #1   Title  Pt will decrease NIH-CPSI score from 60% to < 30 % in order to return urinary function     Time  12    Period  Weeks    Status  On-going      PT LONG TERM GOAL #2   Title   Pt will report being able to relax and urinate in a public restroom of multiple stalls in order to participate in community events    Time  5    Period  Weeks    Status  Achieved      PT LONG TERM GOAL #3   Title  Pt will demo increased spinal mobility in the thoracic area to optimize diaphragm and pelvic floor function     Time  4    Period  Weeks    Status  Achieved            Plan - 03/19/18 2637    Clinical Impression Statement  Pt demo'd minor cues for alignment and propioception iwth yoga sequence he learned at last session. Tranfesering from floor to stand required moderate cues. Pt reported feeling relaxed after guided relaxation. Pt continues to benefit from skilled PT and is close to d/c upon next session.  Rehab Potential  Good    PT Frequency  1x / week    PT Duration  12 weeks    PT Treatment/Interventions  Patient/family education;Therapeutic activities;Therapeutic exercise;Balance training;Gait training;Moist Heat;Neuromuscular re-education;Functional mobility training;Aquatic Therapy;Manual techniques;Dry needling;Scar mobilization;Compression bandaging;Splinting;Manual lymph drainage;Energy conservation;Biofeedback    Consulted and Agree with Plan of Care  Patient       Patient will benefit from skilled therapeutic intervention in order to improve the following deficits and impairments:  Improper body mechanics, Postural dysfunction, Decreased scar mobility, Decreased mobility, Decreased coordination, Decreased endurance, Decreased range of motion, Decreased strength, Hypomobility, Difficulty walking, Decreased balance, Decreased safety awareness, Increased muscle spasms, Abnormal gait, Decreased activity tolerance, Increased fascial restricitons  Visit Diagnosis: Other symptoms and signs involving the musculoskeletal system  Unspecified lack of coordination     Problem List Patient Active Problem List   Diagnosis Date Noted  . Skin lesion 08/11/2017  .  Toenail deformity 08/11/2017  . Anxiety 09/17/2016  . GERD (gastroesophageal reflux disease) 08/10/2016  . Rosacea 08/10/2016  . Laryngopharyngeal reflux (LPR) 06/09/2016  . Chronic pansinusitis 05/01/2016  . Hypertrophy, nasal, turbinate 05/01/2016  . Nasal congestion 04/03/2016  . Gout 01/16/2016  . Encounter for screening examination for infectious disease 01/16/2016  . Left foot pain 01/12/2016  . BPH with obstruction/lower urinary tract symptoms 09/21/2015  . 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 Encounter for general adult medical examination with abnormal findings 07/09/2015  . Advance care planning 07/09/2015  . Knee pain 07/09/2015  . Essential hypertension 04/23/2010  . Obstructive sleep apnea 03/13/2010  . VENTRICULAR HYPERTROPHY, LEFT 03/13/2010  . DYSPNEA ON EXERTION 01/11/2010  . CANDIDIASIS, UROGENITAL 10/12/2009  . Genital herpes 11/27/2008  . HYPERSOMNIA, ASSOCIATED WITH SLEEP APNEA 08/29/2008  . ALLERGIC RHINITIS 06/27/2008  . TESTOSTERONE DEFICIENCY 01/28/2008  . ERECTILE DYSFUNCTION 01/10/2008  . HLD (hyperlipidemia) 09/23/2006    Jerl Mina ,PT, DPT, E-RYT  03/19/2018, 8:12 AM  [place] MAIN [facility] SERVICES [address] [place], [place], [address] Phone: [phone]   Fax:  [phone]  Name: Joseph Drake MRN: [id number] Date of Birth: 1960/11/13

## 2018-03-24 ENCOUNTER — Ambulatory Visit: Payer: BLUE CROSS/BLUE SHIELD | Admitting: Physical Therapy

## 2018-04-01 ENCOUNTER — Ambulatory Visit: Payer: BLUE CROSS/BLUE SHIELD | Attending: Urology | Admitting: Physical Therapy

## 2018-04-01 DIAGNOSIS — R279 Unspecified lack of coordination: Secondary | ICD-10-CM | POA: Diagnosis present

## 2018-04-01 DIAGNOSIS — R29898 Other symptoms and signs involving the musculoskeletal system: Secondary | ICD-10-CM | POA: Insufficient documentation

## 2018-04-01 NOTE — Patient Instructions (Signed)
Strengthening larger hip muscles   L hip only ( R sidelying)    Clam Shell 45 Degrees Lying with hips and knees bent 45, one pillow between knees and ankles. Lift knee with exhale. Be sure pelvis does not roll backward. Do not arch back. Do 15 times,  2 times per day.   L side stepping one hallway -    Complimentary stretch: Cross _ foot over _ thigh, opposite knee straight  3 breaths   Figure -4

## 2018-04-02 NOTE — Therapy (Addendum)
[place] MAIN [facility] SERVICES [address] [place], [place], [address] Phone: [phone]   Fax:  [phone]  Physical Therapy Treatment [place] [facility] Note  Patient Details  Name: Joseph Drake MRN: [id number] Date of Birth: October 09, 1960 Referring Provider (PT): Erlene Quan MD   Encounter Date: 04/01/2018  PT End of Session - 04/02/18 1154    Visit Number  10    Number of Visits  12    Date for PT Re-Evaluation  04/08/18    PT Start Time  9211    PT Stop Time  1150    PT Time Calculation (min)  45 min    Activity Tolerance  Patient tolerated treatment well;No increased pain       Past Medical History:  Diagnosis Date  . Acid reflux   . Allergy   . BPH (benign prostatic hypertrophy)   . Candida infection   . Dyspnea   . ED (erectile dysfunction)   . Genital herpes   . History of continuous positive airway pressure (CPAP) therapy   . HTN (hypertension)   . Hydronephrosis   . Hyperlipidemia   . Kidney stone on left side   . LVH (left ventricular hypertrophy)   . Obesity   . Palpitation    Holter in May 2014 with NSR, PVC, rare PACs  . Palpitation   . Sleep apnea   . Testosterone deficiency     Past Surgical History:  Procedure Laterality Date  . CIRCUMCISION    . NASAL SEPTUM SURGERY      There were no vitals filed for this visit.  Subjective Assessment - 04/01/18 1114    Subjective  Pt noticed it smay take a little bit of time to start. Initially there is mm contraction with flow, but once pt is able to relax the muscle contraction, the flow is better. The happy baby pose helps stretch teh bottom pelvic floor and the lunge on the chair.     Pertinent History  Pt has been wearing a CPAP for 8 years for OSA.  Pt has passed the kidney in 07/25/2014. Pt has had gout and an larged L ventricle of the heart. Pt has not gone to see his cardiologist since 2011-12.           [facility] PT Assessment - 04/02/18 1155      Other:   Other/  Comments  stairs: poor eccentric control on descend, poor plantarfelxion / no anterior COM on rise . Cued for more anterior COM and PF which decreased knee pain      Strength   Overall Strength Comments  L hip flexion 4-/5, R 5/5, hip ext R 4-/5, L 5/5                     [facility] Adult PT Treatment/Exercise - 04/02/18 1156      Therapeutic Activites    Therapeutic Activities  --   see pt instructions     Neuro Re-ed    Neuro Re-ed Details   see pt instructions                  PT Long Term Goals - 04/01/18 1114      PT LONG TERM GOAL #1   Title  Pt will decrease NIH-CPSI score from 60% to < 30 % in order to return urinary function ( 12/5: 30%)     Time  12    Period  Weeks    Status  Achieved      PT LONG TERM GOAL #2   Title  Pt will report being able to relax and urinate in a public restroom of multiple stalls in order to participate in community events    Time  5    Period  Weeks    Status  Achieved      PT LONG TERM GOAL #3   Title  Pt will demo increased spinal mobility in the thoracic area to optimize diaphragm and pelvic floor function     Time  4    Period  Weeks    Status  Achieved      PT LONG TERM GOAL #4   Title  Pt will demo increased hip strength flexion /abd on L from 4-/5 to 5/5 in order to stair climb with less knee pain at work for cardio health    Time  8    Period  Weeks    Status  New    Target Date  05/27/18            Plan - 04/02/18 1155    Clinical Impression Statement  Pt has achieved 3/4 goals and is making improvements with pelvic floor control to initiate urination compared to prior to [facility]. Pt has demo'd improved mobility of the spine, improved diaphragmatic /pelvic coordination with less straining. Pt is progressing well towards remaining goal related to stairclimbing. Pt demo'd improved co-activation of lower kinetic chain and deep core after training today. Pt would like to stairclimb for aerobic exercise but has  had knee issues in the past. Plan to continue to help pt integrate into a physical activity routine given co morbidities. Educated the importance of cool down with stair climbing for exercise given co morbidities. Pt continues to benefit from skilled PT.     Rehab Potential  Good    PT Frequency  1x / week    PT Duration  12 weeks    PT Treatment/Interventions  Patient/family education;Therapeutic activities;Therapeutic exercise;Balance training;Gait training;Moist Heat;Neuromuscular re-education;Functional mobility training;Aquatic Therapy;Manual techniques;Dry needling;Scar mobilization;Compression bandaging;Splinting;Manual lymph drainage;Energy conservation;Biofeedback    Consulted and Agree with Plan of Care  Patient       Patient will benefit from skilled therapeutic intervention in order to improve the following deficits and impairments:  Improper body mechanics, Postural dysfunction, Decreased scar mobility, Decreased mobility, Decreased coordination, Decreased endurance, Decreased range of motion, Decreased strength, Hypomobility, Difficulty walking, Decreased balance, Decreased safety awareness, Increased muscle spasms, Abnormal gait, Decreased activity tolerance, Increased fascial restricitons  Visit Diagnosis: Other symptoms and signs involving the musculoskeletal system  Unspecified lack of coordination     Problem List Patient Active Problem List   Diagnosis Date Noted  . Skin lesion 08/11/2017  . Toenail deformity 08/11/2017  . Anxiety 09/17/2016  . GERD (gastroesophageal reflux disease) 08/10/2016  . Rosacea 08/10/2016  . Laryngopharyngeal reflux (LPR) 06/09/2016  . Chronic pansinusitis 05/01/2016  . Hypertrophy, nasal, turbinate 05/01/2016  . Nasal congestion 04/03/2016  . Gout 01/16/2016  . Encounter for screening examination for infectious disease 01/16/2016  . Left foot pain 01/12/2016  . BPH with obstruction/lower urinary tract symptoms 09/21/2015  . Erectile  dysfunction of organic origin 09/21/2015  . Encounter for general adult medical examination with abnormal findings 07/09/2015  . Advance care planning 07/09/2015  . Knee pain 07/09/2015  . Essential hypertension 04/23/2010  . Obstructive sleep apnea 03/13/2010  . VENTRICULAR HYPERTROPHY, LEFT 03/13/2010  . DYSPNEA ON EXERTION 01/11/2010  . CANDIDIASIS, UROGENITAL 10/12/2009  .  Genital herpes 11/27/2008  . HYPERSOMNIA, ASSOCIATED WITH SLEEP APNEA 08/29/2008  . ALLERGIC RHINITIS 06/27/2008  . TESTOSTERONE DEFICIENCY 01/28/2008  . ERECTILE DYSFUNCTION 01/10/2008  . HLD (hyperlipidemia) 09/23/2006    Jerl Mina ,PT, DPT, E-RYT  04/02/2018, 11:57 AM  [place] MAIN [facility] SERVICES [address] [place], [place], [address] Phone: [phone]   Fax:  [phone]  Name: Chaz Mcglasson MRN: [id number] Date of Birth: 20-Jan-1961

## 2018-04-05 ENCOUNTER — Ambulatory Visit: Payer: BLUE CROSS/BLUE SHIELD | Admitting: Psychology

## 2018-04-05 DIAGNOSIS — F411 Generalized anxiety disorder: Secondary | ICD-10-CM | POA: Diagnosis not present

## 2018-04-07 ENCOUNTER — Ambulatory Visit: Payer: BLUE CROSS/BLUE SHIELD | Admitting: Urology

## 2018-04-07 ENCOUNTER — Encounter: Payer: Self-pay | Admitting: Urology

## 2018-04-07 VITALS — BP 115/76 | HR 96 | Ht 69.0 in | Wt 202.0 lb

## 2018-04-07 DIAGNOSIS — N138 Other obstructive and reflux uropathy: Secondary | ICD-10-CM | POA: Diagnosis not present

## 2018-04-07 DIAGNOSIS — M6289 Other specified disorders of muscle: Secondary | ICD-10-CM | POA: Diagnosis not present

## 2018-04-07 DIAGNOSIS — N401 Enlarged prostate with lower urinary tract symptoms: Secondary | ICD-10-CM | POA: Diagnosis not present

## 2018-04-07 NOTE — Progress Notes (Signed)
04/07/2018 11:11 AM   Joseph Drake 12-02-1960 [id number]  Referring provider: Tonia Ghent, MD [address] [place], [place] [address]  Chief Complaint  Patient presents with  . 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 His last visit with [place] was on 01/01/2018.   He reports of doing well overall and notices improvement but still c/o of voiding dysfunction. He reports of physical therapy going well (10 visits) and reports of improvement in urinary symptoms from the exercises. He reports of feeling his pelvic floor muscles contracting after urinary flow which is intermittent. He believes that his voiding dysfunction is more of a muscular issue and not related to prostate. He reports of feeling nervous when in public restroom due to privacy issues. He has primarily been sitting for void but is hoping to stand once he continues to improve.  He has reported being on Finasteride and flomax for several years.    Background History:  He was previously evaluated by the office for refractory lower urinary tract symptoms.His symptoms included urinary hesitancy, intermittency and weak stream.  He reported that on occasion, when he relaxes a little bit, he is able to void without difficulty.  He reports needing to sit and wait at times for stream to start and ultimately will start once he relaxes his body. He is currently on maximal medical therapy with finasteride and Flomax. Cystoscopy on 11/2017 showed relatively normal prostate without significant coaptation, small prostate, 3 cm length. He required Valium for his procedure. PSA 0.15 on 07/2017.     PMH: Past Medical History:  Diagnosis Date  . Acid reflux   . Allergy   . BPH (benign prostatic hypertrophy)   . Candida infection   . Dyspnea   . ED (erectile dysfunction)   . Genital herpes   .  History of continuous positive airway pressure (CPAP) therapy   . HTN (hypertension)   . Hydronephrosis   . Hyperlipidemia   . Kidney stone on left side   . LVH (left ventricular hypertrophy)   . Obesity   . Palpitation    Holter in May 2014 with NSR, PVC, rare PACs  . Palpitation   . Sleep apnea   . Testosterone deficiency     Surgical History: Past Surgical History:  Procedure Laterality Date  . CIRCUMCISION    . NASAL SEPTUM SURGERY      Home Medications:  Allergies as of 04/07/2018      Reactions   Niacin And Related Hives   Penicillins    rash   Rosuvastatin    REACTION: myalgia      Medication List        Accurate as of 04/07/18 11:11 AM. Always use your most recent med list.          benazepril 5 MG tablet Commonly known as:  LOTENSIN Take 5 mg by mouth daily.   cetirizine 10 MG tablet Commonly known as:  ZYRTEC Take 10 mg by mouth daily.   colchicine 0.6 MG tablet TAKE ONE TABLET BY MOUTH DAILY AS NEEDED FOR GOUT.  Okay to fill with mitigare or colcrys also.   escitalopram 5 MG tablet Commonly known as:  LEXAPRO Take 1-2 tablets (5-10 mg total) by mouth daily.   ezetimibe-simvastatin 10-20 MG tablet Commonly known as:  VYTORIN Take 1 tablet by mouth daily.  finasteride 5 MG tablet Commonly known as:  PROSCAR TAKE 1 TABLET BY MOUTH EVERY DAY   fluticasone 50 MCG/ACT nasal spray Commonly known as:  FLONASE Place 2 sprays into both nostrils daily.   omeprazole 20 MG capsule Commonly known as:  PRILOSEC Take 20 mg by mouth daily.   sildenafil 100 MG tablet Commonly known as:  VIAGRA Take 1 tablet (100 mg total) by mouth daily as needed for erectile dysfunction. Take two hours prior to intercourse on an empty stomach   tamsulosin 0.4 MG Caps capsule Commonly known as:  FLOMAX TAKE 1 CAPSULE BY MOUTH EVERY DAY   valACYclovir 500 MG tablet Commonly known as:  VALTREX TAKE 1 TABLET (500 MG TOTAL) BY MOUTH DAILY.       Allergies:    Allergies  Allergen Reactions  . Niacin And Related Hives  . Penicillins     rash  . Rosuvastatin     REACTION: myalgia    Family History: Family History  Problem Relation Age of Onset  . Alcohol abuse Mother   . Hyperlipidemia Mother   . Hypertension Mother   . Hypertension Father   . Hyperlipidemia Father   . Colon cancer Paternal Uncle   . Prostate cancer Neg Hx   . Kidney disease Neg Hx   . Allergic rhinitis Neg Hx   . Asthma Neg Hx   . Kidney cancer Neg Hx   . Bladder Cancer Neg Hx     Social History:  reports that he has never smoked. He has never used smokeless tobacco. He reports that he drinks alcohol. He reports that he does not use drugs.  ROS: UROLOGY Frequent Urination?: No Hard to postpone urination?: No Burning/pain with urination?: No Get up at night to urinate?: No Leakage of urine?: No Urine stream starts and stops?: No Trouble starting stream?: No Do you have to strain to urinate?: No Blood in urine?: No Urinary tract infection?: No Sexually transmitted disease?: No Injury to kidneys or bladder?: No Painful intercourse?: No Weak stream?: No Erection problems?: No Penile pain?: No  Gastrointestinal Nausea?: No Vomiting?: No Indigestion/heartburn?: No Diarrhea?: No Constipation?: No  Constitutional Fever: No Night sweats?: No Weight loss?: No Fatigue?: No  Skin Skin rash/lesions?: No Itching?: No  Eyes Blurred vision?: No Double vision?: No  Ears/Nose/Throat Sore throat?: No Sinus problems?: No  Hematologic/Lymphatic Swollen glands?: No Easy bruising?: No  Cardiovascular Leg swelling?: No Chest pain?: No  Respiratory Cough?: No Shortness of breath?: No  Endocrine Excessive thirst?: No  Musculoskeletal Back pain?: No Joint pain?: No  Neurological Headaches?: No Dizziness?: No  Psychologic Depression?: No Anxiety?: No  Physical Exam: BP 115/76   Pulse 96   Ht 5\' 9"  (1.753 m)   Wt 202 lb (91.6 kg)    BMI 29.83 kg/m   Constitutional:  Alert and oriented, No acute distress. HEENT: [place] AT, moist mucus membranes.  Trachea midline, no masses. Cardiovascular: No clubbing, cyanosis, or edema. Respiratory: Normal respiratory effort, no increased work of breathing. Skin: No rashes, bruises or suspicious lesions. Neurologic: Grossly intact, no focal deficits, moving all 4 extremities. Psychiatric: Normal mood and affect.   Assessment & Plan:    1. BPH with obstruction/lower urinary tract symptoms  Currently on maximal medical therapy, continue Flomax and finasteride for the time being Given symptoms likely at least partially related to #2, will consider stopping finasteride only in 6 months Discontinue Finasteride in 6 months and return for MD visit in 1 year for PSA/rectal  2. Pelvic Floor Dysfunction  Continue with PT  Improvement in urinary symptoms    Return in about 1 year (around 04/08/2019) for MD visit PSA/rectal/ IPSS/ PVR.  Hollice Espy, MD  [facility] [address], [place], [place] [address] [phone]  I, Lucas Mallow, am acting as a scribe for Dr. Hollice Espy,  I have reviewed the above documentation for accuracy and completeness, and I agree with the above.   Hollice Espy, MD

## 2018-04-09 NOTE — Addendum Note (Signed)
Addended by: Jerl Mina on: 04/09/2018 04:34 PM   Modules accepted: Orders

## 2018-05-03 ENCOUNTER — Ambulatory Visit: Payer: BLUE CROSS/BLUE SHIELD | Admitting: Physical Therapy

## 2018-05-17 ENCOUNTER — Ambulatory Visit: Payer: BLUE CROSS/BLUE SHIELD | Attending: Urology | Admitting: Physical Therapy

## 2018-05-17 DIAGNOSIS — R29898 Other symptoms and signs involving the musculoskeletal system: Secondary | ICD-10-CM | POA: Diagnosis not present

## 2018-05-17 DIAGNOSIS — R279 Unspecified lack of coordination: Secondary | ICD-10-CM

## 2018-05-17 NOTE — Therapy (Addendum)
[place] MAIN [facility] SERVICES [address] [place], [place], [address] Phone: [phone]   Fax:  [phone]  Physical Therapy Treatment Discharge Summary    Patient Details  Name: Joseph Drake MRN: [id number] Date of Birth: Sep 14, 1960 Referring Provider (PT): Erlene Quan MD   Encounter Date: 05/17/2018  PT End of Session - 05/17/18 1657    Visit Number  11    Number of Visits  12    Date for PT Re-Evaluation  06/25/18    PT Start Time  July 04, 1601    PT Stop Time  07-05-1631    PT Time Calculation (min)  30 min    Activity Tolerance  Patient tolerated treatment well;No increased pain       Past Medical History:  Diagnosis Date  . Acid reflux   . Allergy   . BPH (benign prostatic hypertrophy)   . Candida infection   . Dyspnea   . ED (erectile dysfunction)   . Genital herpes   . History of continuous positive airway pressure (CPAP) therapy   . HTN (hypertension)   . Hydronephrosis   . Hyperlipidemia   . Kidney stone on left side   . LVH (left ventricular hypertrophy)   . Obesity   . Palpitation    Holter in May 2014 with NSR, PVC, rare PACs  . Palpitation   . Sleep apnea   . Testosterone deficiency     Past Surgical History:  Procedure Laterality Date  . CIRCUMCISION    . NASAL SEPTUM SURGERY      There were no vitals filed for this visit.  Subjective Assessment - 05/17/18 1604    Subjective  Pt reports his urination Sx is still stable and improving. When he is standing up to urinate, pt can feel the the pelvic floor muscle relax. That has been a "noticable improvement than it was."  The majority of the time pt is still sitting to urinate because it is more relaxing.  Pt is doing some of his work stretches.  Pt has been practicing the way to climb stairs 3-5 x a day a flight of stairs. Pt has not had the knee pain with stairs.      Pertinent History  Pt has been wearing a CPAP for 8 years for OSA.  Pt has passed the kidney in 07-04-14. Pt  has had gout and an larged L ventricle of the heart. Pt has not gone to see his cardiologist since 2011-12.           [facility] PT Assessment - 05/17/18 1656      Strength   Overall Strength Comments  B hip flexion B, hip abd B 4+/5                   [facility] Adult PT Treatment/Exercise - 05/17/18 1656      Therapeutic Activites    Therapeutic Activities  --   progressed hip abd stretngthen w/ band, discussed maintaining HEP post d/c                 PT Long Term Goals - 05/17/18 1606      PT LONG TERM GOAL #1   Title  Pt will decrease NIH-CPSI score from 60% to < 30 % in order to return urinary function ( 12/5: 30%)     Time  12    Period  Weeks    Status  Achieved      PT LONG TERM GOAL #2  Title  Pt will report being able to relax and urinate in a public restroom of multiple stalls in order to participate in community events    Time  5    Period  Weeks    Status  Achieved      PT LONG TERM GOAL #3   Title  Pt will demo increased spinal mobility in the thoracic area to optimize diaphragm and pelvic floor function     Time  4    Period  Weeks    Status  Achieved      PT LONG TERM GOAL #4   Title  Pt will demo increased hip strength flexion /abd on L from 4-/5 to 5/5 in order to stair climb with less knee pain at work for cardio health  ( 1/20 : 5/5 flexion B, hip B:  4+/5)     Time  8    Period  Weeks    Status  Partially Met            Plan - 05/17/18 1611    Clinical Impression Statement  Pt has achieved 3/4 goals and partially met remaining  Hip strengthening goal. Pt reports he has made 60% improvement with initiating urination with quicker ease to relax pelvic floor mm when standing.  Pt's NIH-CPSI score decreased from 60% to 30%, indicating improved pelvic floor function and QOL. Pt has demonstrated decreased pelvic floor mm tightness, improved pelvic floor coordination/ lengthening, and global muscle/spinal mobility. Pt also remain  compliant with relaxation strategies such as body scan/ mindfulness and yoga stretches as pt expressed interest in learning relaxation strategies. These behavorial strategies have also contributed to his pelvic floor improvements.  Pt was progressed to next level of hip strengthening exercises, educated on how to select community yoga classes ( Gentle yoga and no vinyasa flow, hot yoga, nor vigorous style 2/2 to cardiac conditions), and educated on gradually increasing stair climbing/ descent for cardio workout at work. Since pt received cues for proper alignment to improve eccentric control and hip strengthening exercises, pt has reported no more pain with knees when navigating stairs. Pt was educated to remain compliant with work stretches. Pt voiced understanding. Pt is ready for d/c today.        Rehab Potential  Good    PT Frequency  1x / week    PT Duration  12 weeks    PT Treatment/Interventions  Patient/family education;Therapeutic activities;Therapeutic exercise;Balance training;Gait training;Moist Heat;Neuromuscular re-education;Functional mobility training;Aquatic Therapy;Manual techniques;Dry needling;Scar mobilization;Compression bandaging;Splinting;Manual lymph drainage;Energy conservation;Biofeedback    Consulted and Agree with Plan of Care  Patient       Patient will benefit from skilled therapeutic intervention in order to improve the following deficits and impairments:  Improper body mechanics, Postural dysfunction, Decreased scar mobility, Decreased mobility, Decreased coordination, Decreased endurance, Decreased range of motion, Decreased strength, Hypomobility, Difficulty walking, Decreased balance, Decreased safety awareness, Increased muscle spasms, Abnormal gait, Decreased activity tolerance, Increased fascial restricitons  Visit Diagnosis: Other symptoms and signs involving the musculoskeletal system  Unspecified lack of coordination     Problem List Patient Active  Problem List   Diagnosis Date Noted  . Skin lesion 08/11/2017  . Toenail deformity 08/11/2017  . Anxiety 09/17/2016  . GERD (gastroesophageal reflux disease) 08/10/2016  . Rosacea 08/10/2016  . Laryngopharyngeal reflux (LPR) 06/09/2016  . Chronic pansinusitis 05/01/2016  . Hypertrophy, nasal, turbinate 05/01/2016  . Nasal congestion 04/03/2016  . Gout 01/16/2016  . Encounter for screening examination for infectious disease 01/16/2016  .  Left foot pain 01/12/2016  . BPH with obstruction/lower urinary tract symptoms 09/21/2015  . 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 Encounter for general adult medical examination with abnormal findings 07/09/2015  . Advance care planning 07/09/2015  . Knee pain 07/09/2015  . Essential hypertension 04/23/2010  . Obstructive sleep apnea 03/13/2010  . VENTRICULAR HYPERTROPHY, LEFT 03/13/2010  . DYSPNEA ON EXERTION 01/11/2010  . CANDIDIASIS, UROGENITAL 10/12/2009  . Genital herpes 11/27/2008  . HYPERSOMNIA, ASSOCIATED WITH SLEEP APNEA 08/29/2008  . ALLERGIC RHINITIS 06/27/2008  . TESTOSTERONE DEFICIENCY 01/28/2008  . ERECTILE DYSFUNCTION 01/10/2008  . HLD (hyperlipidemia) 09/23/2006    Jerl Mina ,PT, DPT, E-RYT  05/17/2018, 4:57 PM  [place] [facility]IN [facility] SERVICES [address] [place], [place], [address] Phone: [phone]   Fax:  [phone]  Name: Joseph Drake MRN: [id number] Date of Birth: 1960/09/03

## 2018-06-07 ENCOUNTER — Ambulatory Visit (INDEPENDENT_AMBULATORY_CARE_PROVIDER_SITE_OTHER): Payer: BLUE CROSS/BLUE SHIELD | Admitting: Psychology

## 2018-06-07 ENCOUNTER — Other Ambulatory Visit: Payer: Self-pay | Admitting: Family Medicine

## 2018-06-07 ENCOUNTER — Encounter: Payer: Self-pay | Admitting: Physical Therapy

## 2018-06-07 DIAGNOSIS — F411 Generalized anxiety disorder: Secondary | ICD-10-CM | POA: Diagnosis not present

## 2018-06-21 ENCOUNTER — Encounter: Payer: Self-pay | Admitting: Physical Therapy

## 2018-07-24 ENCOUNTER — Other Ambulatory Visit: Payer: Self-pay | Admitting: Family Medicine

## 2018-07-24 ENCOUNTER — Other Ambulatory Visit: Payer: Self-pay | Admitting: Urology

## 2018-08-09 ENCOUNTER — Ambulatory Visit: Payer: BLUE CROSS/BLUE SHIELD | Admitting: Psychology

## 2018-08-22 ENCOUNTER — Other Ambulatory Visit: Payer: Self-pay | Admitting: Family Medicine

## 2018-08-23 NOTE — Telephone Encounter (Signed)
Pt scheduled for 08/24/18 @ 2pm.

## 2018-08-23 NOTE — Telephone Encounter (Signed)
Pharmacy requests refill on: escitalopram 5mg    LAST REFILL: #60, 0 refills on 07/26/18 LAST OV:   01/15/18 anxiety NEXT OV:   None scheduled  PHARMACY:  [facility]  Patient needs follow up on anxiety medication management to see how he is doing.   Will refill X 1 and have Bridgett set patient up for a virtual visit if able to follow up on medications.   FYI to Dr. Damita Dunnings

## 2018-08-24 ENCOUNTER — Other Ambulatory Visit: Payer: Self-pay

## 2018-08-24 ENCOUNTER — Ambulatory Visit (INDEPENDENT_AMBULATORY_CARE_PROVIDER_SITE_OTHER): Payer: BLUE CROSS/BLUE SHIELD | Admitting: Family Medicine

## 2018-08-24 ENCOUNTER — Encounter: Payer: Self-pay | Admitting: Family Medicine

## 2018-08-24 VITALS — Temp 97.1°F | Ht 69.0 in | Wt 235.0 lb

## 2018-08-24 DIAGNOSIS — F419 Anxiety disorder, unspecified: Secondary | ICD-10-CM | POA: Diagnosis not present

## 2018-08-24 DIAGNOSIS — N401 Enlarged prostate with lower urinary tract symptoms: Secondary | ICD-10-CM

## 2018-08-24 DIAGNOSIS — I10 Essential (primary) hypertension: Secondary | ICD-10-CM

## 2018-08-24 DIAGNOSIS — N138 Other obstructive and reflux uropathy: Secondary | ICD-10-CM

## 2018-08-24 DIAGNOSIS — M109 Gout, unspecified: Secondary | ICD-10-CM | POA: Diagnosis not present

## 2018-08-24 DIAGNOSIS — E785 Hyperlipidemia, unspecified: Secondary | ICD-10-CM

## 2018-08-24 MED ORDER — ESCITALOPRAM OXALATE 10 MG PO TABS: 10.0000 mg | ORAL_TABLET | Freq: Every day | ORAL | 3 refills | Status: DC

## 2018-08-24 NOTE — Telephone Encounter (Signed)
Thanks

## 2018-08-24 NOTE — Progress Notes (Signed)
Virtual visit completed through WebEx or similar program Patient location: home  Provider location: [place] at [facility], office   Limitations and rationale for visit method d/w patient.  Patient agreed to proceed.   CC: follow up.   HPI:  Mood. Compliant with med.  Taking 10mg  of lexapro a day.  No ADE on med.  Mood is good.  Pandemic considerations d/w pt.  He is managing the situation.  He is working from home. He has no sx of covid.  No SI/HI.  Anxiety is better on med.   Reasonable to continue as is.  He agrees.   Finasteride per urology.  He may end up tapering that med later this year per urology rec.  I will defer and patient agrees.   Hypertension:   Using medication without problems or lightheadedness: yes Chest pain with exertion:no Edema:no Short of breath:no  Gout.  Last flare was "a while back."  No recent colchicine use.  Has med on hand if needed.  D/w pt.  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    Elevated Cholesterol:  Using medications without problems: yes Muscle aches: no Diet compliance: encouraged.  Exercise: encouraged D/w pt about f/u labs.    Meds and allergies reviewed.   Past medical history and social history reviewed.  ROS: Per HPI unless specifically indicated in ROS section   NAD Speech wnl  A/P:  Mood. Compliant with med.  Taking 10mg  of lexapro a day.  No ADE on med.  Mood is good.  Pandemic considerations d/w pt.  He is managing the situation.  He is working from home. He has no sx of covid.  No SI/HI.  Anxiety is better on med.   Reasonable to continue as is.  He agrees.   Finasteride per urology.  He may end up tapering that med later this year per urology rec.  I will defer and patient agrees.   Hypertension:  Continue work on diet and exercise.  No change in meds.  He agrees.  He has follow-up labs pending.  Gout.  Last flare was "a while back."  No recent colchicine use.  Has med on hand if needed.  D/w pt.  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   Follow-up  labs pending.  Elevated Cholesterol: Continue work on diet and exercise.  No change in meds.  He agrees.  He has follow-up labs pending.  Lab visit tomorrow at 8:05 AM.

## 2018-08-25 ENCOUNTER — Encounter: Payer: Self-pay | Admitting: Family Medicine

## 2018-08-25 ENCOUNTER — Other Ambulatory Visit (INDEPENDENT_AMBULATORY_CARE_PROVIDER_SITE_OTHER): Payer: BLUE CROSS/BLUE SHIELD

## 2018-08-25 ENCOUNTER — Other Ambulatory Visit: Payer: Self-pay

## 2018-08-25 DIAGNOSIS — I10 Essential (primary) hypertension: Secondary | ICD-10-CM | POA: Diagnosis not present

## 2018-08-25 DIAGNOSIS — M109 Gout, unspecified: Secondary | ICD-10-CM

## 2018-08-25 LAB — LIPID PANEL
Cholesterol: 156 mg/dL (ref 0–200)
HDL: 32.7 mg/dL — ABNORMAL LOW
NonHDL: 123.62
Total CHOL/HDL Ratio: 5
Triglycerides: 261 mg/dL — ABNORMAL HIGH (ref 0.0–149.0)
VLDL: 52.2 mg/dL — ABNORMAL HIGH (ref 0.0–40.0)

## 2018-08-25 LAB — COMPREHENSIVE METABOLIC PANEL WITH GFR
ALT: 31 U/L (ref 0–53)
AST: 21 U/L (ref 0–37)
Albumin: 4.2 g/dL (ref 3.5–5.2)
Alkaline Phosphatase: 101 U/L (ref 39–117)
BUN: 14 mg/dL (ref 6–23)
CO2: 31 meq/L (ref 19–32)
Calcium: 9 mg/dL (ref 8.4–10.5)
Chloride: 102 meq/L (ref 96–112)
Creatinine, Ser: 1.26 mg/dL (ref 0.40–1.50)
GFR: 58.6 mL/min — ABNORMAL LOW
Glucose, Bld: 102 mg/dL — ABNORMAL HIGH (ref 70–99)
Potassium: 5.1 meq/L (ref 3.5–5.1)
Sodium: 139 meq/L (ref 135–145)
Total Bilirubin: 0.6 mg/dL (ref 0.2–1.2)
Total Protein: 6.8 g/dL (ref 6.0–8.3)

## 2018-08-25 LAB — URIC ACID: Uric Acid, Serum: 7.6 mg/dL (ref 4.0–7.8)

## 2018-08-25 LAB — LDL CHOLESTEROL, DIRECT: Direct LDL: 94 mg/dL

## 2018-08-25 NOTE — Assessment & Plan Note (Signed)
Continue work on diet and exercise.  No change in meds.  He agrees.  He has follow-up labs pending.

## 2018-08-25 NOTE — Assessment & Plan Note (Signed)
  Finasteride per urology.  He may end up tapering that med later this year per urology rec.  I will defer and patient agrees.

## 2018-08-25 NOTE — Assessment & Plan Note (Signed)
  Gout.  Last flare was "a while back."  No recent colchicine use.  Has med on hand if needed.  D/w pt.  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   Follow-up labs pending.

## 2018-08-25 NOTE — Assessment & Plan Note (Signed)
  Mood. Compliant with med.  Taking 10mg  of lexapro a day.  No ADE on med.  Mood is good.  Pandemic considerations d/w pt.  He is managing the situation.  He is working from home. He has no sx of covid.  No SI/HI.  Anxiety is better on med.   Reasonable to continue as is.  He agrees.

## 2018-09-27 ENCOUNTER — Encounter: Payer: Self-pay | Admitting: Urology

## 2018-09-28 ENCOUNTER — Telehealth: Payer: Self-pay

## 2018-09-28 MED ORDER — TAMSULOSIN HCL 0.4 MG PO CAPS: 0.4000 mg | ORAL_CAPSULE | Freq: Every day | ORAL | 1 refills | Status: DC

## 2018-09-28 NOTE — Telephone Encounter (Signed)
Script sent  

## 2018-10-16 ENCOUNTER — Other Ambulatory Visit: Payer: Self-pay | Admitting: Family Medicine

## 2018-10-18 ENCOUNTER — Ambulatory Visit (INDEPENDENT_AMBULATORY_CARE_PROVIDER_SITE_OTHER): Payer: BC Managed Care – PPO | Admitting: Psychology

## 2018-10-18 DIAGNOSIS — F411 Generalized anxiety disorder: Secondary | ICD-10-CM

## 2018-11-24 ENCOUNTER — Other Ambulatory Visit: Payer: Self-pay | Admitting: Family Medicine

## 2018-11-24 NOTE — Telephone Encounter (Signed)
Sent. Thanks.   

## 2019-02-12 ENCOUNTER — Other Ambulatory Visit: Payer: Self-pay | Admitting: Urology

## 2019-04-13 ENCOUNTER — Other Ambulatory Visit: Payer: Self-pay

## 2019-04-13 DIAGNOSIS — N138 Other obstructive and reflux uropathy: Secondary | ICD-10-CM

## 2019-04-14 ENCOUNTER — Other Ambulatory Visit: Payer: BC Managed Care – PPO

## 2019-04-14 ENCOUNTER — Other Ambulatory Visit: Payer: Self-pay

## 2019-04-14 DIAGNOSIS — N401 Enlarged prostate with lower urinary tract symptoms: Secondary | ICD-10-CM

## 2019-04-14 DIAGNOSIS — N138 Other obstructive and reflux uropathy: Secondary | ICD-10-CM

## 2019-04-15 LAB — PSA: Prostate Specific Ag, Serum: 1 ng/mL (ref 0.0–4.0)

## 2019-04-19 ENCOUNTER — Ambulatory Visit: Payer: BLUE CROSS/BLUE SHIELD | Admitting: Urology

## 2019-05-10 ENCOUNTER — Encounter: Payer: Self-pay | Admitting: Urology

## 2019-05-10 ENCOUNTER — Ambulatory Visit: Payer: BC Managed Care – PPO | Admitting: Urology

## 2019-05-10 ENCOUNTER — Other Ambulatory Visit: Payer: Self-pay

## 2019-05-10 VITALS — BP 152/84 | HR 94 | Ht 69.0 in | Wt 236.0 lb

## 2019-05-10 DIAGNOSIS — M6289 Other specified disorders of muscle: Secondary | ICD-10-CM | POA: Diagnosis not present

## 2019-05-10 DIAGNOSIS — R310 Gross hematuria: Secondary | ICD-10-CM

## 2019-05-10 DIAGNOSIS — N401 Enlarged prostate with lower urinary tract symptoms: Secondary | ICD-10-CM | POA: Diagnosis not present

## 2019-05-10 DIAGNOSIS — R159 Full incontinence of feces: Secondary | ICD-10-CM | POA: Diagnosis not present

## 2019-05-10 DIAGNOSIS — R361 Hematospermia: Secondary | ICD-10-CM

## 2019-05-10 DIAGNOSIS — N138 Other obstructive and reflux uropathy: Secondary | ICD-10-CM

## 2019-05-10 NOTE — Progress Notes (Signed)
05/10/2019 3:07 PM   Joseph Drake 1961/01/08 [id number]  Referring provider: Tonia Ghent, MD [address] [place],  [place] [address]  Chief Complaint  Patient presents with  . Benign Prostatic Hypertrophy    HPI: 59 year old male with BPH/pelvic floor dysfunction who returns today for routine annual follow-up.  He was last seen in 03/2018.  He is currently on Flomax but stopped taking Flomax little over a year ago.  He sees no significant change in his urinary symptoms since stopping the medication and does not believe it helped him much.  In the past, he did benefit significantly from pelvic floor therapy.  He completed this last in 04/2018.  He has been not particularly good about doing these exercises over the last 6+ months and since becoming more lax by doing these, symptoms have worsened.  He also mentions today that he has been struggling with fecal incontinence.  This seems to be worsening over the past several months as well.  He denies any constipation or bowel issues.  No other neurological symptoms.  He has not addressed this with his primary care.  Lastly today, he reports that he had a episode of painless gross hematuria.  This was just around the holidays.  He reports that after straining with a bowel movement, he saw a little bit of blood on the front side of the toilet.  He was concerned that this came from his urethra this way up to the head of his penis and saw some blood as well.  He also had an episode of hematospermia associated with this.  He had no associated urinary symptoms.  No fevers or chills.  He does have remote history of kidney stones diagnosed in 2016.  He is not had upper tract imaging since then.  Non-smoker.  His most recent PSA was 1.0 on 03/2019.   PMH: Past Medical History:  Diagnosis Date  . Acid reflux   . Allergy   . BPH (benign prostatic hypertrophy)   . Candida infection   . Dyspnea   . ED (erectile dysfunction)   .  Genital herpes   . History of continuous positive airway pressure (CPAP) therapy   . HTN (hypertension)   . Hydronephrosis   . Hyperlipidemia   . Kidney stone on left side   . LVH (left ventricular hypertrophy)   . Obesity   . Palpitation    Holter in May 2014 with NSR, PVC, rare PACs  . Palpitation   . Sleep apnea   . Testosterone deficiency     Surgical History: Past Surgical History:  Procedure Laterality Date  . CIRCUMCISION    . NASAL SEPTUM SURGERY      Home Medications:  Allergies as of 05/10/2019      Reactions   Niacin And Related Hives   Penicillins    rash   Rosuvastatin    REACTION: myalgia      Medication List       Accurate as of May 10, 2019  3:07 PM. If you have any questions, ask your nurse or doctor.        STOP taking these medications   finasteride 5 MG tablet Commonly known as: PROSCAR Stopped by: Hollice Espy, MD     TAKE these medications   benazepril 5 MG tablet Commonly known as: LOTENSIN TAKE 1 TABLET BY MOUTH  DAILY   cetirizine 10 MG tablet Commonly known as: ZYRTEC Take 10 mg by mouth daily.   colchicine  0.6 MG tablet TAKE ONE TABLET BY MOUTH DAILY AS NEEDED FOR GOUT.  Okay to fill with mitigare or colcrys also.   escitalopram 10 MG tablet Commonly known as: LEXAPRO Take 1 tablet (10 mg total) by mouth daily. What changed: Another medication with the same name was removed. Continue taking this medication, and follow the directions you see here. Changed by: Hollice Espy, MD   ezetimibe-simvastatin 10-20 MG tablet Commonly known as: VYTORIN TAKE 1 TABLET BY MOUTH  DAILY   fluticasone 50 MCG/ACT nasal spray Commonly known as: FLONASE Place 2 sprays into both nostrils daily.   omeprazole 20 MG capsule Commonly known as: PRILOSEC Take 20 mg by mouth daily.   sildenafil 100 MG tablet Commonly known as: VIAGRA Take 1 tablet (100 mg total) by mouth daily as needed for erectile dysfunction. Take two hours prior to  intercourse on an empty stomach   tamsulosin 0.4 MG Caps capsule Commonly known as: FLOMAX TAKE 1 CAPSULE BY MOUTH  DAILY   valACYclovir 500 MG tablet Commonly known as: VALTREX TAKE 1 TABLET BY MOUTH  DAILY       Allergies:  Allergies  Allergen Reactions  . Niacin And Related Hives  . Penicillins     rash  . Rosuvastatin     REACTION: myalgia    Family History: Family History  Problem Relation Age of Onset  . Alcohol abuse Mother   . Hyperlipidemia Mother   . Hypertension Mother   . Hypertension Father   . Hyperlipidemia Father   . Colon cancer Paternal Uncle   . Prostate cancer Neg Hx   . Kidney disease Neg Hx   . Allergic rhinitis Neg Hx   . Asthma Neg Hx   . Kidney cancer Neg Hx   . Bladder Cancer Neg Hx     Social History:  reports that he has never smoked. He has never used smokeless tobacco. He reports current alcohol use. He reports that he does not use drugs.  ROS: UROLOGY Frequent Urination?: No Hard to postpone urination?: No Burning/pain with urination?: No Get up at night to urinate?: Yes Leakage of urine?: No Urine stream starts and stops?: No Trouble starting stream?: No Do you have to strain to urinate?: No Blood in urine?: Yes Urinary tract infection?: No Sexually transmitted disease?: No Injury to kidneys or bladder?: No Painful intercourse?: No Weak stream?: No Erection problems?: Yes Penile pain?: No  Gastrointestinal Nausea?: No Vomiting?: No Indigestion/heartburn?: No Diarrhea?: No Constipation?: No  Constitutional Night sweats?: No Weight loss?: No Fatigue?: No  Skin Skin rash/lesions?: No Itching?: No  Eyes Blurred vision?: No Double vision?: No  Ears/Nose/Throat Sore throat?: No Sinus problems?: No  Hematologic/Lymphatic Swollen glands?: No Easy bruising?: No  Cardiovascular Leg swelling?: No Chest pain?: No  Respiratory Cough?: No Shortness of breath?: No  Endocrine Excessive thirst?: No   Musculoskeletal Back pain?: No Joint pain?: No  Neurological Headaches?: No Dizziness?: No  Psychologic Depression?: No Anxiety?: No  Physical Exam: BP (!) 152/84   Pulse 94   Ht 5\' 9"  (1.753 m)   Wt 236 lb (107 kg)   BMI 34.85 kg/m   Constitutional:  Alert and oriented, No acute distress. HEENT: [place] AT, moist mucus membranes.  Trachea midline, no masses. Cardiovascular: No clubbing, cyanosis, or edema. Respiratory: Normal respiratory effort, no increased work of breathing. GI: Abdomen is soft, nontender, nondistended, no abdominal masses Rectal: Normal sphincter tone.  45 cc prostate, nontender, symmetric, no nodules. Skin: No rashes, bruises  or suspicious lesions. Neurologic: Grossly intact, no focal deficits, moving all 4 extremities. Psychiatric: Normal mood and affect.  Laboratory Data: Lab Results  Component Value Date   WBC 9.6 07/15/2014   HGB 15.4 07/15/2014   HCT 44.4 07/15/2014   MCV 88.4 07/15/2014   PLT 217 07/15/2014    Lab Results  Component Value Date   CREATININE 1.26 08/25/2018    PSA as above  Urinalysis UA today negative  Pertinent Imaging: n/a  Assessment & Plan:    1. Pelvic floor dysfunction Strongly encouraged to resume pelvic floor exercises as these have been very beneficial in the past, we discussed how to set and achieve goals today at length  2. BPH with obstruction/lower urinary tract symptoms Continue Flomax for the time being until he is reestablished good habits with #1  After he has been engaging in these exercises for several weeks, he can take a Flomax holiday to see if he still needs to be on this medication.  Resume as needed.  3. Incontinence of feces, unspecified fecal incontinence type No other neurological symptoms with good sphincter tone  We discussed importance of stool consistency  Patient may benefit from resumption of pelvic floor therapy as above  If symptoms fail to resolve with the above, advised  to follow-up with PCP  4. Gross hematuria Episode of painless gross hematuria  Given the nature of his bleeding, it sounds like is likely prostatic in origin  Given his history of kidney stones will gross hematuria, recommended hematuria evaluation with CT urogram.  Cystoscopy was completed in late 2019, will assess whether or not we need to repeat this based on CT scan findings.  If he does end up needing cystoscopy, he will require Valium to tolerate the procedure is he did previously.  We will call him with his CT results and recommendations. - CT HEMATURIA WORKUP; Future - Urinalysis, Complete  5. Hematospermia  I explained to the patient some of the conditions that may cause hematospermia, such as: disorders of the prostate gland, seminal vesicles, spermatic cord, and ejaculatory duct system; urogenital infections including sexually transmitted infections (eg, chlamydia, herpes simplex virus, gonorrhea, trichomonas); metastatic cancers; vascular malformations; congenital and drug-induced bleeding disorders; and even frequent daily ejaculation over a period of several weeks.  I reassured him that his exam was normal and that we may never discover a reason for his hematospermia and it is most likely a benign symptom.      Return in about 1 year (around 05/09/2020) for PSA/ DRE, IPSS/ PVR/ UA  also will call with CT urogram results (cysto TBD).  Hollice Espy, MD  [facility] [address], [address] [place], [place] [address] [phone]

## 2019-05-11 LAB — MICROSCOPIC EXAMINATION
Bacteria, UA: NONE SEEN
RBC, Urine: NONE SEEN /hpf (ref 0–2)

## 2019-05-11 LAB — URINALYSIS, COMPLETE
Bilirubin, UA: NEGATIVE
Glucose, UA: NEGATIVE
Ketones, UA: NEGATIVE
Leukocytes,UA: NEGATIVE
Nitrite, UA: NEGATIVE
Protein,UA: NEGATIVE
RBC, UA: NEGATIVE
Specific Gravity, UA: 1.01 (ref 1.005–1.030)
Urobilinogen, Ur: 0.2 mg/dL (ref 0.2–1.0)
pH, UA: 6.5 (ref 5.0–7.5)

## 2019-05-21 ENCOUNTER — Encounter: Payer: Self-pay | Admitting: Urology

## 2019-05-23 ENCOUNTER — Other Ambulatory Visit: Payer: Self-pay

## 2019-07-16 ENCOUNTER — Other Ambulatory Visit: Payer: Self-pay | Admitting: Family Medicine

## 2019-08-03 ENCOUNTER — Other Ambulatory Visit: Payer: Self-pay | Admitting: Family Medicine

## 2019-08-03 DIAGNOSIS — I10 Essential (primary) hypertension: Secondary | ICD-10-CM

## 2019-08-03 DIAGNOSIS — M109 Gout, unspecified: Secondary | ICD-10-CM

## 2019-08-12 ENCOUNTER — Other Ambulatory Visit (INDEPENDENT_AMBULATORY_CARE_PROVIDER_SITE_OTHER): Payer: BC Managed Care – PPO

## 2019-08-12 ENCOUNTER — Other Ambulatory Visit: Payer: Self-pay

## 2019-08-12 DIAGNOSIS — M109 Gout, unspecified: Secondary | ICD-10-CM | POA: Diagnosis not present

## 2019-08-12 DIAGNOSIS — R310 Gross hematuria: Secondary | ICD-10-CM

## 2019-08-12 DIAGNOSIS — I10 Essential (primary) hypertension: Secondary | ICD-10-CM

## 2019-08-12 LAB — COMPREHENSIVE METABOLIC PANEL
ALT: 29 U/L (ref 0–53)
AST: 26 U/L (ref 0–37)
Albumin: 4 g/dL (ref 3.5–5.2)
Alkaline Phosphatase: 95 U/L (ref 39–117)
BUN: 18 mg/dL (ref 6–23)
CO2: 24 mEq/L (ref 19–32)
Calcium: 8.4 mg/dL (ref 8.4–10.5)
Chloride: 104 mEq/L (ref 96–112)
Creatinine, Ser: 1.1 mg/dL (ref 0.40–1.50)
GFR: 68.31 mL/min (ref >60.00)
Glucose, Bld: 105 mg/dL — ABNORMAL HIGH (ref 70–99)
Potassium: 4.1 mEq/L (ref 3.5–5.1)
Sodium: 137 mEq/L (ref 135–145)
Total Bilirubin: 0.6 mg/dL (ref 0.2–1.2)
Total Protein: 6.4 g/dL (ref 6.0–8.3)

## 2019-08-12 LAB — LIPID PANEL
Cholesterol: 140 mg/dL (ref 0–200)
HDL: 27.1 mg/dL — ABNORMAL LOW (ref >39.00)
LDL Cholesterol: 73 mg/dL (ref 0–99)
NonHDL: 112.47
Total CHOL/HDL Ratio: 5
Triglycerides: 197 mg/dL — ABNORMAL HIGH (ref 0.0–149.0)
VLDL: 39.4 mg/dL (ref 0.0–40.0)

## 2019-08-12 LAB — URIC ACID: Uric Acid, Serum: 7.9 mg/dL — ABNORMAL HIGH (ref 4.0–7.8)

## 2019-08-18 ENCOUNTER — Ambulatory Visit (INDEPENDENT_AMBULATORY_CARE_PROVIDER_SITE_OTHER): Payer: BC Managed Care – PPO | Admitting: Family Medicine

## 2019-08-18 ENCOUNTER — Encounter: Payer: Self-pay | Admitting: Family Medicine

## 2019-08-18 ENCOUNTER — Other Ambulatory Visit: Payer: Self-pay

## 2019-08-18 VITALS — BP 104/70 | HR 87 | Temp 97.5°F | Ht 69.0 in | Wt 232.1 lb

## 2019-08-18 DIAGNOSIS — Z0000 Encounter for general adult medical examination without abnormal findings: Secondary | ICD-10-CM | POA: Diagnosis not present

## 2019-08-18 DIAGNOSIS — I10 Essential (primary) hypertension: Secondary | ICD-10-CM

## 2019-08-18 DIAGNOSIS — M109 Gout, unspecified: Secondary | ICD-10-CM

## 2019-08-18 DIAGNOSIS — F419 Anxiety disorder, unspecified: Secondary | ICD-10-CM

## 2019-08-18 DIAGNOSIS — Z7189 Other specified counseling: Secondary | ICD-10-CM

## 2019-08-18 DIAGNOSIS — G4733 Obstructive sleep apnea (adult) (pediatric): Secondary | ICD-10-CM

## 2019-08-18 MED ORDER — VALACYCLOVIR HCL 500 MG PO TABS: 500.0000 mg | ORAL_TABLET | Freq: Every day | ORAL | 3 refills | Status: DC

## 2019-08-18 MED ORDER — BENAZEPRIL HCL 5 MG PO TABS: 5.0000 mg | ORAL_TABLET | Freq: Every day | ORAL | 3 refills | Status: DC

## 2019-08-18 MED ORDER — EZETIMIBE-SIMVASTATIN 10-20 MG PO TABS: 1.0000 | ORAL_TABLET | Freq: Every day | ORAL | 3 refills | Status: DC

## 2019-08-18 MED ORDER — ESCITALOPRAM OXALATE 10 MG PO TABS: 10.0000 mg | ORAL_TABLET | Freq: Every day | ORAL | 3 refills | Status: DC

## 2019-08-18 MED ORDER — COLCHICINE 0.6 MG PO TABS: ORAL_TABLET | ORAL | 2 refills | Status: AC

## 2019-08-18 NOTE — Patient Instructions (Signed)
Thanks for you effort.  If you have to take colchicine then skip vytorin.  Take care.  Glad to see you. Update me as needed.

## 2019-08-18 NOTE — Progress Notes (Signed)
This visit occurred during the SARS-CoV-2 public health emergency.  Safety protocols were in place, including screening questions prior to the visit, additional usage of staff PPE, and extensive cleaning of exam room while observing appropriate contact time as indicated for disinfecting solutions.  CPE- See plan.  Routine anticipatory guidance given to patient.  See health maintenance.  The possibility exists that previously documented standard health maintenance information may have been brought forward from a previous encounter into this note.  If needed, that same information has been updated to reflect the current situation based on today's encounter.    Flu done yearly per patient report, at work.   Tetanus 2015 PNA not due.  shingrix 2020 covid vaccine 2021, pfizer.  Colonoscopy 2017 PSA per urology.  I'll defer.  He agrees.  He had bloody ejaculate with urology f/u pending.   Living will- wife designated if patient were incapacitated.  HCV preg nev HIV screening done, neg, in 1990s.  Diet and exercise, d/w pt.   No recent gout sx.  No recent colchicine use.    Hypertension:    Using medication without problems or lightheadedness: yes Chest pain with exertion:no Edema:no Short of breath:no Labs d/w pt.    Elevated Cholesterol: Using medications without problems: yes Muscle aches: no Diet compliance: encouraged.  Exercise:encouraged.   Mood d/w pt.  Still on lexapro with relief.  No ADE on med.  Less anxious on med.  He wanted to continue as is.  No SI/HI.    OSA on CPAP.  Compliant.  Used nightly.  "I wouldn't want to be without it."  Sleeping better with use.  Used nightly >6 hours.  Snoring resolved with use.  Fatigue resolved with use.    PMH and SH reviewed  Meds, vitals, and allergies reviewed.   ROS: Per HPI.  Unless specifically indicated otherwise in HPI, the patient denies:  General: fever. Eyes: acute vision changes ENT: sore throat Cardiovascular: chest  pain Respiratory: SOB GI: vomiting GU: dysuria Musculoskeletal: acute back pain Derm: acute rash Neuro: acute motor dysfunction Psych: worsening mood Endocrine: polydipsia Heme: bleeding Allergy: hayfever  GEN: nad, alert and oriented HEENT: ncat NECK: supple w/o LA CV: rrr. PULM: ctab, no inc wob ABD: soft, +bs EXT: no edema SKIN: no acute rash

## 2019-08-23 DIAGNOSIS — Z0000 Encounter for general adult medical examination without abnormal findings: Secondary | ICD-10-CM | POA: Insufficient documentation

## 2019-08-23 NOTE — Assessment & Plan Note (Signed)
OSA on CPAP.  Compliant.  Used nightly.  "I wouldn't want to be without it."  Sleeping better with use.  Used nightly >6 hours.  Snoring resolved with use.  Fatigue resolved with use.  Continue as is.

## 2019-08-23 NOTE — Assessment & Plan Note (Signed)
Continue Vytorin.  Continue work on diet and exercise.  He agrees.

## 2019-08-23 NOTE — Assessment & Plan Note (Signed)
Flu done yearly per patient report, at work.   Tetanus 2015 PNA not due.  shingrix 2020 covid vaccine 2021, pfizer.  Colonoscopy 2017 PSA per urology.  I'll defer.  He agrees.   Living will- wife designated if patient were incapacitated.  HCV preg nev HIV screening done, neg, in 1990s.  Diet and exercise, d/w pt.

## 2019-08-23 NOTE — Assessment & Plan Note (Signed)
Mood d/w pt.  Still on lexapro with relief.  No ADE on med.  Less anxious on med.  He wanted to continue as is.  No SI/HI.  Continue as is.

## 2019-08-23 NOTE — Assessment & Plan Note (Signed)
Continue benazepril.  No change in meds.  Labs discussed with patient.  Continue work on diet and exercise.  He agrees.

## 2019-08-23 NOTE — Assessment & Plan Note (Signed)
No recent gout sx.  No recent colchicine use.   He will update me as needed.

## 2019-08-23 NOTE — Progress Notes (Signed)
08/24/19 9:51 AM   Joseph Drake December 05, 1960 [id number]  Referring provider: Tonia Ghent, MD [address] [place],  [place] [address] Chief Complaint  Patient presents with  . Follow-up    HPI: Joseph Drake is a 59 y.o. with BPH/pelvic floor dysfunction returns today for the evaluation and management of blood in semen.   He was last seen in the office with Dr. Erlene Quan on 05/10/2019.  At that time, he was experiencing fecal incontinence and had an episode of painless gross hematuria in addition to his hematospermia.  He was encouraged to continue with his PT exercises and to undergo a CT Urogram for the evaluation of his gross hematuria.  He has not had the CT scan as of this visit.  He continues to have episodes of hematospermia, but he has not had further episodes of gross hematuria.  Patient denies any modifying or aggravating factors.  Patient denies any dysuria or suprapubic/flank pain.  Patient denies any fevers, chills, nausea or vomiting.   He has not noted any pain with ejaculation.    His UA today is negative.    PMH: Past Medical History:  Diagnosis Date  . Acid reflux   . Allergy   . BPH (benign prostatic hypertrophy)   . Candida infection   . Dyspnea   . ED (erectile dysfunction)   . Genital herpes   . History of continuous positive airway pressure (CPAP) therapy   . HTN (hypertension)   . Hydronephrosis   . Hyperlipidemia   . Kidney stone on left side   . LVH (left ventricular hypertrophy)   . Obesity   . Palpitation    Holter in May 2014 with NSR, PVC, rare PACs  . Palpitation   . Sleep apnea   . Testosterone deficiency     Surgical History: Past Surgical History:  Procedure Laterality Date  . CIRCUMCISION    . NASAL SEPTUM SURGERY      Home Medications:  Allergies as of 08/24/2019      Reactions   Niacin And Related Hives   Penicillins    rash   Rosuvastatin    REACTION: myalgia      Medication List       Accurate as of  August 24, 2019 11:59 PM. If you have any questions, ask your nurse or doctor.        benazepril 5 MG tablet Commonly known as: LOTENSIN Take 1 tablet (5 mg total) by mouth daily.   cetirizine 10 MG tablet Commonly known as: ZYRTEC Take 10 mg by mouth daily.   colchicine 0.6 MG tablet TAKE ONE TABLET BY MOUTH DAILY AS NEEDED FOR GOUT.  Okay to fill with mitigare or colcrys also.   escitalopram 10 MG tablet Commonly known as: LEXAPRO Take 1 tablet (10 mg total) by mouth daily.   ezetimibe-simvastatin 10-20 MG tablet Commonly known as: VYTORIN Take 1 tablet by mouth daily.   fluticasone 50 MCG/ACT nasal spray Commonly known as: FLONASE Place 2 sprays into both nostrils daily.   omeprazole 20 MG capsule Commonly known as: PRILOSEC Take 20 mg by mouth daily.   valACYclovir 500 MG tablet Commonly known as: VALTREX Take 1 tablet (500 mg total) by mouth daily.       Allergies:  Allergies  Allergen Reactions  . Niacin And Related Hives  . Penicillins     rash  . Rosuvastatin     REACTION: myalgia    Family History: Family History  Problem  Relation Age of Onset  . Alcohol abuse Mother   . Hyperlipidemia Mother   . Hypertension Mother   . Hypertension Father   . Hyperlipidemia Father   . Colon cancer Paternal Uncle   . Prostate cancer Neg Hx   . Kidney disease Neg Hx   . Allergic rhinitis Neg Hx   . Asthma Neg Hx   . Kidney cancer Neg Hx   . Bladder Cancer Neg Hx     Social History:  reports that he has never smoked. He has never used smokeless tobacco. He reports current alcohol use. He reports that he does not use drugs.   Physical Exam: BP (!) 137/98   Pulse 87   Ht 5\' 9"  (1.753 m)   Wt 225 lb (102.1 kg)   BMI 33.23 kg/m   Constitutional:  Well nourished. Alert and oriented, No acute distress. HEENT: [place] AT, mask in place.  Trachea midline. Cardiovascular: No clubbing, cyanosis, or edema. Respiratory: Normal respiratory effort, no increased work of  breathing. Neurologic: Grossly intact, no focal deficits, moving all 4 extremities. Psychiatric: Normal mood and affect.  Laboratory Data:  Lab Results  Component Value Date   CREATININE 1.10 08/12/2019   Urinalysis Component     Latest Ref Rng & Units 08/24/2019  Specific Gravity, UA     1.005 - 1.030 1.020  pH, UA     5.0 - 7.5 6.5  Color, UA     Yellow Yellow  Appearance Ur     Clear Clear  Leukocytes,UA     Negative Negative  Protein,UA     Negative/Trace Negative  Glucose, UA     Negative Negative  Ketones, UA     Negative Negative  RBC, UA     Negative Negative  Bilirubin, UA     Negative Negative  Urobilinogen, Ur     0.2 - 1.0 mg/dL 0.2  Nitrite, UA     Negative Negative  Microscopic Examination      See below:   Component     Latest Ref Rng & Units 08/24/2019  WBC, UA     0 - 5 /hpf 0-5  RBC     0 - 2 /hpf None seen  Epithelial Cells (non renal)     0 - 10 /hpf 0-10  Bacteria, UA     None seen/Few None seen   I have reviewed the labs.  Pertinent Imaging: No recent imaging  Assessment & Plan:    1. Gross hematuria Encouraged patient to undergo the CT Urogram as he has had an episode of gross hematuria and he is agreement  I will call him with the results  2. Hematospermia Explained that this is usually a benign phenomenon I did encourage the patient to undergo the CT urogram as it can help rule out conditions that may be attributing to the hematospermia   Return for I will call patient with results.  [place] [address] [place], [place] [address] [phone]

## 2019-08-24 ENCOUNTER — Encounter: Payer: Self-pay | Admitting: Urology

## 2019-08-24 ENCOUNTER — Ambulatory Visit: Payer: BC Managed Care – PPO | Admitting: Urology

## 2019-08-24 ENCOUNTER — Other Ambulatory Visit: Payer: Self-pay

## 2019-08-24 VITALS — BP 137/98 | HR 87 | Ht 69.0 in | Wt 225.0 lb

## 2019-08-24 DIAGNOSIS — R361 Hematospermia: Secondary | ICD-10-CM

## 2019-08-24 DIAGNOSIS — R310 Gross hematuria: Secondary | ICD-10-CM

## 2019-08-25 LAB — URINALYSIS, COMPLETE
Bilirubin, UA: NEGATIVE
Glucose, UA: NEGATIVE
Ketones, UA: NEGATIVE
Leukocytes,UA: NEGATIVE
Nitrite, UA: NEGATIVE
Protein,UA: NEGATIVE
RBC, UA: NEGATIVE
Specific Gravity, UA: 1.02 (ref 1.005–1.030)
Urobilinogen, Ur: 0.2 mg/dL (ref 0.2–1.0)
pH, UA: 6.5 (ref 5.0–7.5)

## 2019-08-25 LAB — MICROSCOPIC EXAMINATION
Bacteria, UA: NONE SEEN
RBC, Urine: NONE SEEN /hpf (ref 0–2)

## 2019-09-13 ENCOUNTER — Ambulatory Visit
Admission: RE | Admit: 2019-09-13 | Discharge: 2019-09-13 | Disposition: A | Discharge status: Home / Self Care | Payer: BC Managed Care – PPO | Source: Ambulatory Visit | Attending: Urology | Admitting: Urology

## 2019-09-13 ENCOUNTER — Other Ambulatory Visit: Payer: Self-pay

## 2019-09-13 DIAGNOSIS — R310 Gross hematuria: Secondary | ICD-10-CM | POA: Diagnosis present

## 2019-09-13 IMAGING — CT CT ABD-PEL WO/W CM
3 of 12 series · 12 of 46 positions shown, 18 images · IV contrast (omnipaque)
Comparison: 07/15/2014

CLINICAL DATA: Gross hematuria, history of kidney stone

EXAM:
CT ABDOMEN AND PELVIS WITHOUT AND WITH CONTRAST
TECHNIQUE: Multidetector CT imaging of the abdomen and pelvis was performed
following the standard protocol before and following the bolus
administration of intravenous contrast.
CONTRAST:  125mL OMNIPAQUE IOHEXOL 300 MG/ML  SOLN

[Series 5: cor without without pre 2.00 cor · coronal · non-contrast · 0.84mm/px · 2 of 214 slices shown, 3 images]
[im 72/214  soft-tissue]
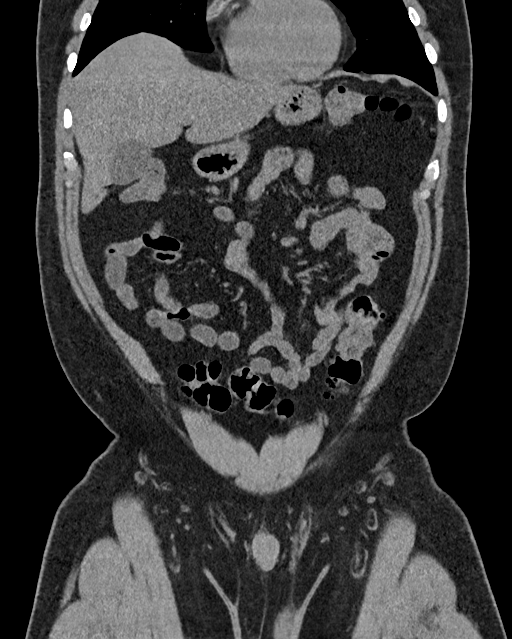
[im 72/214  bone]
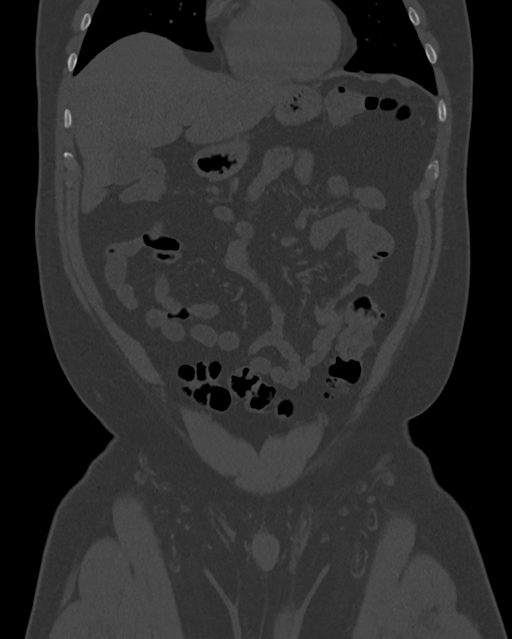
[im 143/214  soft-tissue]
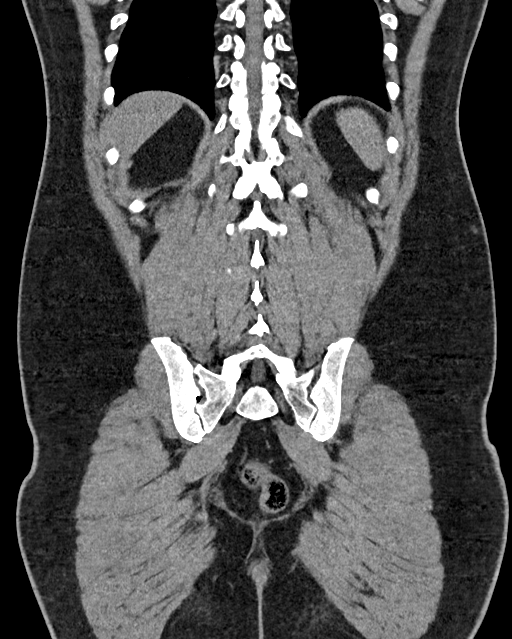

[Series 9: axial with hematuria with 5.00 · axial · 0.84mm/px · z∈[-1561,-1401]mm · 3 of 113 slices shown]
[im 17/113  soft-tissue]
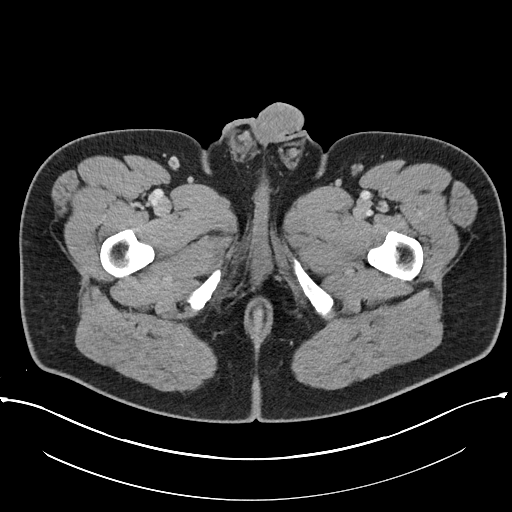
[im 33/113  soft-tissue]
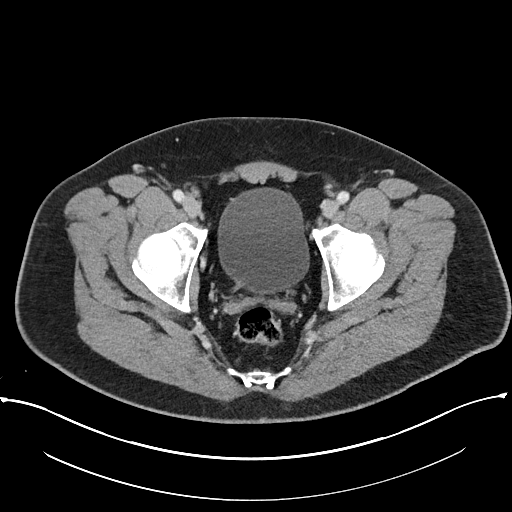
[im 49/113  soft-tissue]
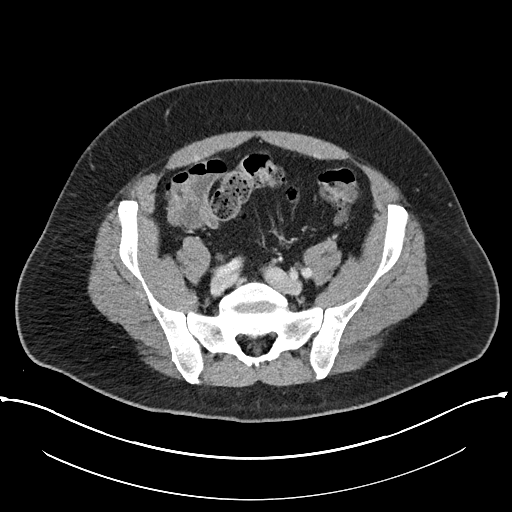

[Series 17: axial delay delay prone 5.00 ax · axial · delayed · 0.76mm/px · z∈[-1602,-1152]mm · 7 of 121 slices shown, 12 images]
[im 16/121  soft-tissue]
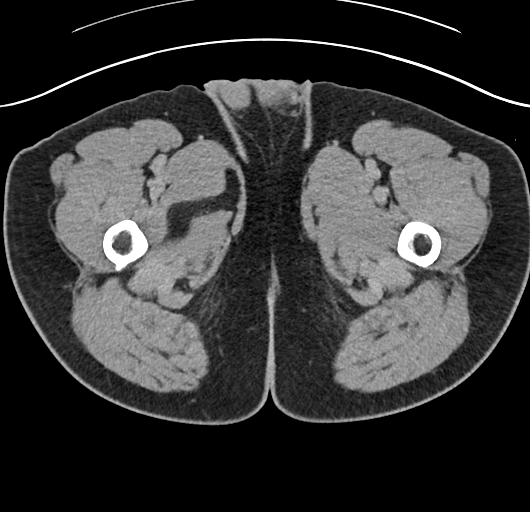
[im 16/121  bone]
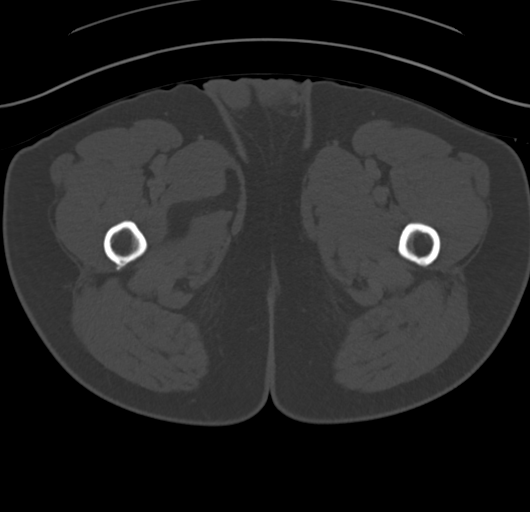
[im 31/121  soft-tissue]
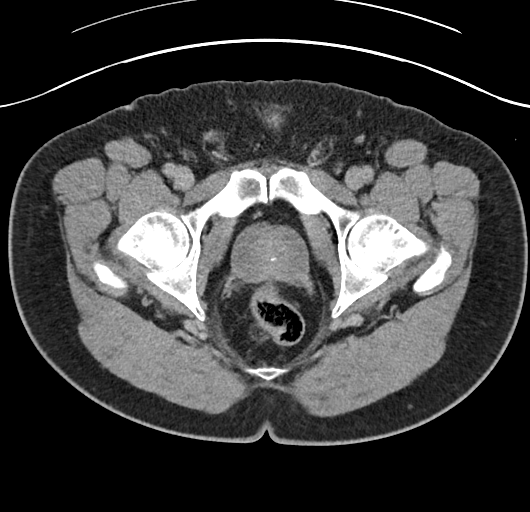
[im 46/121  soft-tissue]
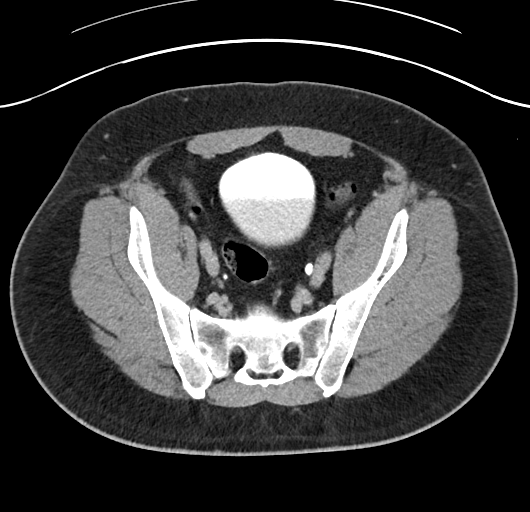
[im 61/121  soft-tissue]
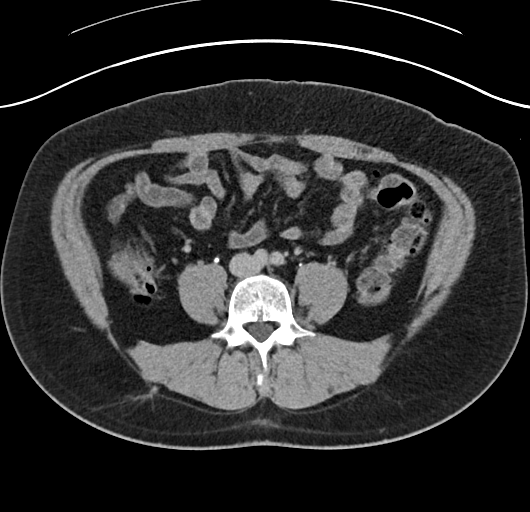
[im 61/121  lung]
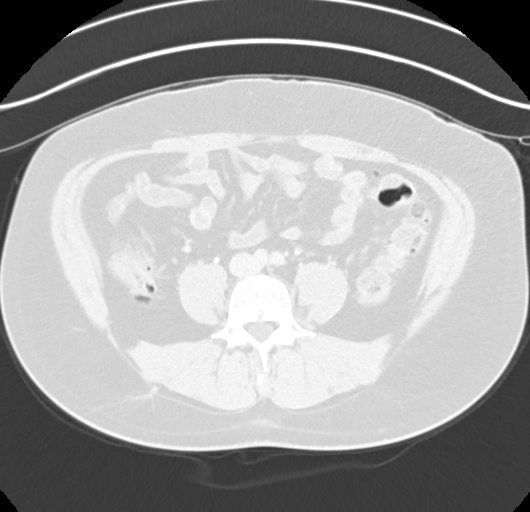
[im 76/121  soft-tissue]
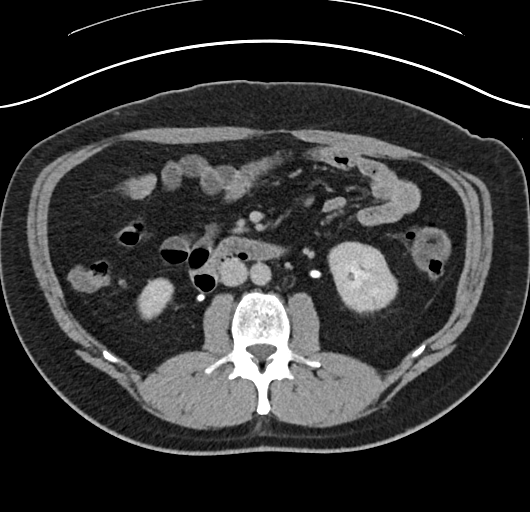
[im 76/121  lung]
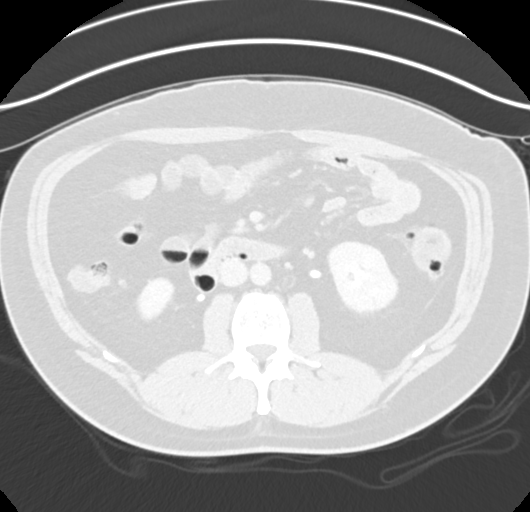
[im 91/121  soft-tissue]
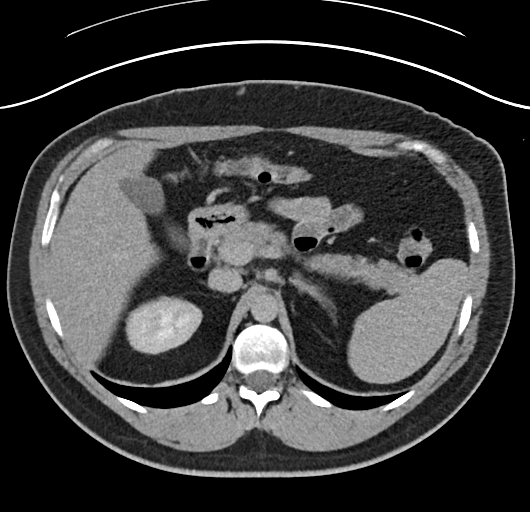
[im 91/121  lung]
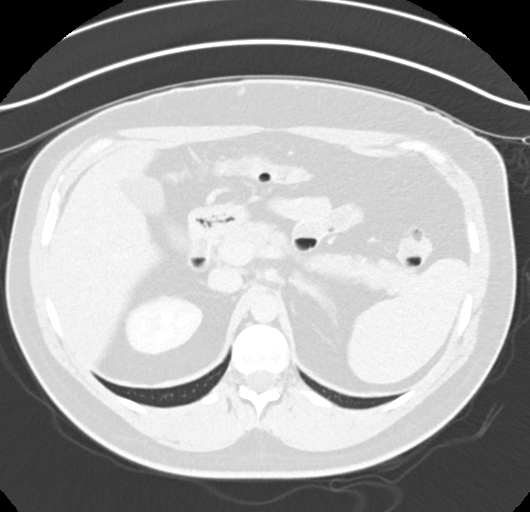
[im 106/121  soft-tissue]
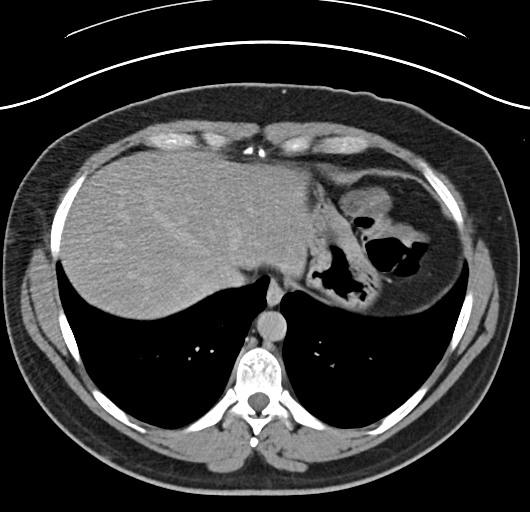
[im 106/121  lung]
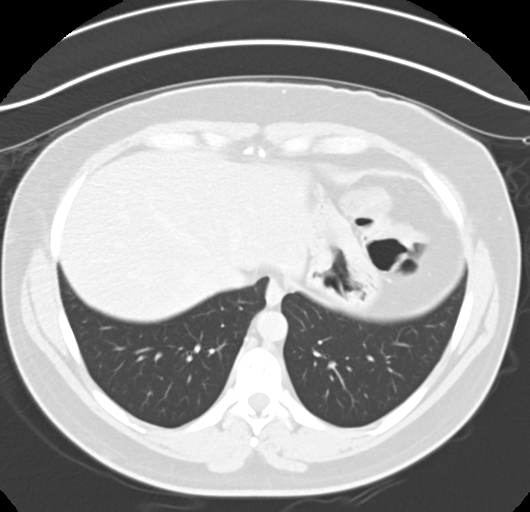

[12 of 46 positions shown; findings below may reference images not displayed]

FINDINGS: Lower chest: Lung bases are clear.

Hepatobiliary: Liver is within normal limits.

Gallbladder is unremarkable. No intrahepatic or extrahepatic ductal
dilatation.

Pancreas: Within normal limits.

Spleen: Within normal limits.

Adrenals/Urinary Tract: Adrenal glands are within normal limits.

Kidneys are within normal limits. No enhancing renal lesions. No
renal, ureteral, or bladder calculi. No hydronephrosis.

On delayed imaging, there are no filling defects in the bilateral
opacified proximal collecting systems, ureters, or bladder.

Bladder is within normal limits.

Stomach/Bowel: Stomach is within normal limits.

No evidence of bowel obstruction.

Appendix is not discretely visualized.

Left colonic diverticulosis, without evidence of diverticulitis.

Vascular/Lymphatic: No evidence of abdominal aortic aneurysm.

Atherosclerotic calcifications of the abdominal aorta and branch
vessels.

No suspicious abdominopelvic lymphadenopathy.

Reproductive: No abdominopelvic ascites.

Other: Mild prostatomegaly.

Musculoskeletal: Visualized osseous structures are within normal
limits.
IMPRESSION: No CT findings to account for the patient's hematuria.

Mild prostatomegaly.

Left colonic diverticulosis, without evidence of diverticulitis.

## 2019-09-13 MED ORDER — IOHEXOL 300 MG/ML  SOLN
125.0000 mL | Freq: Once | INTRAMUSCULAR | Status: AC | PRN
  Administered 2019-09-13: 125 mL via INTRAVENOUS

## 2019-09-14 ENCOUNTER — Telehealth: Payer: Self-pay | Admitting: Family Medicine

## 2019-09-14 NOTE — Telephone Encounter (Signed)
Patient notified and appointment is made and he is not wanting a Valium.

## 2019-09-14 NOTE — Telephone Encounter (Signed)
-----   Message from Nori Riis, PA-C sent at 09/14/2019 12:01 PM EDT ----- Please let Mr. Ro know that his CT Urogram did not identify a source for his hematuria or hematospermia.  I suggest we proceed with the cystoscopy with Dr. Erlene Quan.  I understand that he will require a Valium prior to the procedure.  Which pharmacy would he like for [place] to send the Valium prescription to?

## 2019-10-12 ENCOUNTER — Other Ambulatory Visit: Payer: Self-pay

## 2019-10-12 ENCOUNTER — Ambulatory Visit: Payer: BC Managed Care – PPO | Admitting: Urology

## 2019-10-12 ENCOUNTER — Encounter: Payer: Self-pay | Admitting: Urology

## 2019-10-12 VITALS — BP 144/85 | HR 84

## 2019-10-12 DIAGNOSIS — R310 Gross hematuria: Secondary | ICD-10-CM

## 2019-10-12 DIAGNOSIS — N401 Enlarged prostate with lower urinary tract symptoms: Secondary | ICD-10-CM | POA: Diagnosis not present

## 2019-10-12 DIAGNOSIS — N138 Other obstructive and reflux uropathy: Secondary | ICD-10-CM | POA: Diagnosis not present

## 2019-10-12 NOTE — Progress Notes (Signed)
   10/12/19   CC:  Chief Complaint  Patient presents with  . Cysto    HPI: Joseph Drake is a 59 y.o. M with BPH/pelvic floor dysfunction who presents today for cysto 2/2 blood in semen.   He was last seen in the office with Dr. Erlene Quan on 05/10/2019.  At that time, he was experiencing fecal incontinence and had an episode of painless gross hematuria in addition to his hematospermia.  He was encouraged to continue with his PT exercises and to undergo a CT Urogram for the evaluation of his gross hematuria.  He has not had the CT scan as of this visit.  He continues to have episodes of hematospermia, but he has not had further episodes of gross hematuria.  Patient denies any modifying or aggravating factors.  Patient denies any dysuria or suprapubic/flank pain.  Patient denies any fevers, chills, nausea or vomiting.   He has not noted any pain with ejaculation.    PSA 1.0 checked by PCP.   He reports of sleeping through the night more than half the time which he attributes to exercise. He is not on any BPH medications at the moment.   Please see previous note for details.   Today's Vitals   10/12/19 1401  BP: (!) 144/85  Pulse: 84   There is no height or weight on file to calculate BMI. NED. A&Ox3.   No respiratory distress   Abd soft, NT, ND Normal phallus with bilateral descended testicles  Cystoscopy Procedure Note  Patient identification was confirmed, informed consent was obtained, and patient was prepped using Betadine solution.  Lidocaine jelly was administered per urethral meatus.     Pre-Procedure: - Inspection reveals a normal caliber ureteral meatus.  Procedure: The flexible cystoscope was introduced without difficulty - No urethral strictures/lesions are present. - Enlarged prostate w/ bilobar coaptation  - Normal bladder neck - Bilateral ureteral orifices identified - Bladder mucosa  reveals no ulcers, tumors, or lesions - No bladder stones - No  trabeculation  Retroflexion unremarkable.   Post-Procedure: - Patient tolerated the procedure well  Assessment/ Plan:  1. Gross hematuria  CTU unremarkable  Cysto today reveals NED  Patient reassured today  2. Hematospermia Explained that this is usually a benign phenomenon As above   3. BPH w/ pelvic floor dysfunction  Improving w/ exercise Not on any current BPH medications Strongly encouraged exercises    F/u 1 year or sooner for symptom recheck  I, Nethusan Sivanesan, am acting as a scribe for Dr. Hollice Espy,  I have reviewed the above documentation for accuracy and completeness, and I agree with the above.   Hollice Espy, MD

## 2019-10-17 LAB — URINALYSIS, COMPLETE
Bilirubin, UA: NEGATIVE
Glucose, UA: NEGATIVE
Ketones, UA: NEGATIVE
Leukocytes,UA: NEGATIVE
Nitrite, UA: NEGATIVE
Protein,UA: NEGATIVE
Specific Gravity, UA: 1.01 (ref 1.005–1.030)
Urobilinogen, Ur: 0.2 mg/dL (ref 0.2–1.0)
pH, UA: 5.5 (ref 5.0–7.5)

## 2019-10-17 LAB — MICROSCOPIC EXAMINATION
Bacteria, UA: NONE SEEN
Epithelial Cells (non renal): NONE SEEN /hpf (ref 0–10)

## 2020-05-02 ENCOUNTER — Encounter: Payer: Self-pay | Admitting: Family Medicine

## 2020-05-07 ENCOUNTER — Other Ambulatory Visit: Payer: Self-pay

## 2020-05-15 ENCOUNTER — Ambulatory Visit: Payer: Self-pay | Admitting: Urology

## 2020-06-30 ENCOUNTER — Encounter: Payer: Self-pay | Admitting: Emergency Medicine

## 2020-06-30 ENCOUNTER — Emergency Department
Admission: EM | Admit: 2020-06-30 | Discharge: 2020-06-30 | Disposition: A | Discharge status: Home / Self Care | Payer: BC Managed Care – PPO | Attending: Emergency Medicine | Admitting: Emergency Medicine

## 2020-06-30 ENCOUNTER — Emergency Department: Payer: BC Managed Care – PPO

## 2020-06-30 ENCOUNTER — Other Ambulatory Visit: Payer: Self-pay

## 2020-06-30 DIAGNOSIS — I10 Essential (primary) hypertension: Secondary | ICD-10-CM | POA: Insufficient documentation

## 2020-06-30 DIAGNOSIS — S61112A Laceration without foreign body of left thumb with damage to nail, initial encounter: Secondary | ICD-10-CM | POA: Diagnosis not present

## 2020-06-30 DIAGNOSIS — W268XXA Contact with other sharp object(s), not elsewhere classified, initial encounter: Secondary | ICD-10-CM | POA: Insufficient documentation

## 2020-06-30 DIAGNOSIS — Z23 Encounter for immunization: Secondary | ICD-10-CM | POA: Insufficient documentation

## 2020-06-30 DIAGNOSIS — S6992XA Unspecified injury of left wrist, hand and finger(s), initial encounter: Secondary | ICD-10-CM | POA: Diagnosis present

## 2020-06-30 MED ORDER — TETANUS-DIPHTH-ACELL PERTUSSIS 5-2.5-18.5 LF-MCG/0.5 IM SUSY
0.5000 mL | PREFILLED_SYRINGE | Freq: Once | INTRAMUSCULAR | Status: AC
  Administered 2020-06-30: 0.5 mL via INTRAMUSCULAR
  Filled 2020-06-30: qty 0.5

## 2020-06-30 MED ORDER — SULFAMETHOXAZOLE-TRIMETHOPRIM 800-160 MG PO TABS: 1.0000 | ORAL_TABLET | Freq: Two times a day (BID) | ORAL | 0 refills | Status: DC

## 2020-06-30 MED ORDER — BACITRACIN-NEOMYCIN-POLYMYXIN 400-5-5000 EX OINT
TOPICAL_OINTMENT | Freq: Once | CUTANEOUS | Status: AC
  Filled 2020-06-30: qty 1

## 2020-06-30 MED ORDER — LIDOCAINE HCL (PF) 1 % IJ SOLN
5.0000 mL | Freq: Once | INTRAMUSCULAR | Status: AC
  Administered 2020-06-30: 5 mL
  Filled 2020-06-30: qty 5

## 2020-06-30 MED ORDER — SULFAMETHOXAZOLE-TRIMETHOPRIM 800-160 MG PO TABS
1.0000 | ORAL_TABLET | Freq: Once | ORAL | Status: AC
  Administered 2020-06-30: 1 via ORAL
  Filled 2020-06-30: qty 1

## 2020-06-30 NOTE — ED Notes (Signed)
Discharge instructions provided to patient. Patient denies questions or concerns. Patient ambulatory. NADN.

## 2020-06-30 NOTE — ED Triage Notes (Signed)
Pt reports was using clippers to cut monkey grass and cut his left hand thumb. Bleeding controlled

## 2020-06-30 NOTE — ED Provider Notes (Signed)
[facility] Emergency Department Provider Note ____________________________________________  Time seen: 1500  I have reviewed the triage vital signs and the nursing notes.  HISTORY  Chief Complaint  Laceration   HPI Joseph Drake is a 60 y.o. male presents to the ER today with complaint of a laceration to his left thumb.  He reports this occurred approximately 30 minutes ago.  He reports he was using some hedge clippers when he slipped and caught his left thumb.  He was able to get the bleeding controlled.  He has no idea when his last tetanus was.  He did not clean the wound or take any medications PTA.  Past Medical History:  Diagnosis Date  . Acid reflux   . Allergy   . BPH (benign prostatic hypertrophy)   . Candida infection   . Dyspnea   . ED (erectile dysfunction)   . Genital herpes   . History of continuous positive airway pressure (CPAP) therapy   . HTN (hypertension)   . Hydronephrosis   . Hyperlipidemia   . Kidney stone on left side   . LVH (left ventricular hypertrophy)   . Obesity   . Palpitation    Holter in May 2014 with NSR, PVC, rare PACs  . Palpitation   . Sleep apnea   . Testosterone deficiency     Patient Active Problem List   Diagnosis Date Noted  . Routine general medical examination at a health care facility 08/23/2019  . Anxiety 09/17/2016  . GERD (gastroesophageal reflux disease) 08/10/2016  . Rosacea 08/10/2016  . Laryngopharyngeal reflux (LPR) 06/09/2016  . Hypertrophy, nasal, turbinate 05/01/2016  . Gout 01/16/2016  . BPH with obstruction/lower urinary tract symptoms 09/21/2015  . 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 Advance care planning 07/09/2015  . Knee pain 07/09/2015  . Essential hypertension 04/23/2010  . Obstructive sleep apnea 03/13/2010  . VENTRICULAR HYPERTROPHY, LEFT 03/13/2010  . CANDIDIASIS, UROGENITAL 10/12/2009  . Genital herpes 11/27/2008  . HYPERSOMNIA, ASSOCIATED WITH SLEEP  APNEA 08/29/2008  . ALLERGIC RHINITIS 06/27/2008  . TESTOSTERONE DEFICIENCY 01/28/2008  . HLD (hyperlipidemia) 09/23/2006    Past Surgical History:  Procedure Laterality Date  . CIRCUMCISION    . NASAL SEPTUM SURGERY      Prior to Admission medications   Medication Sig Start Date End Date Taking? Authorizing Provider  sulfamethoxazole-trimethoprim (BACTRIM DS) 800-160 MG tablet Take 1 tablet by mouth 2 (two) times daily. 06/30/20  Yes Jearld Fenton, NP  benazepril (LOTENSIN) 5 MG tablet Take 1 tablet (5 mg total) by mouth daily. 08/18/19   Tonia Ghent, MD  cetirizine (ZYRTEC) 10 MG tablet Take 10 mg by mouth daily.    [provider]  colchicine 0.6 MG tablet TAKE ONE TABLET BY MOUTH DAILY AS NEEDED FOR GOUT.  Okay to fill with mitigare or colcrys also. 08/18/19   Tonia Ghent, MD  escitalopram (LEXAPRO) 10 MG tablet Take 1 tablet (10 mg total) by mouth daily. 08/18/19   Tonia Ghent, MD  ezetimibe-simvastatin (VYTORIN) 10-20 MG tablet Take 1 tablet by mouth daily. 08/18/19   Tonia Ghent, MD  fluticasone Asencion Islam) 50 MCG/ACT nasal spray Place 2 sprays into both nostrils daily.    [provider]  omeprazole (PRILOSEC) 20 MG capsule Take 20 mg by mouth daily.    [provider]  valACYclovir (VALTREX) 500 MG tablet Take 1 tablet (500 mg total) by mouth daily. 08/18/19   Tonia Ghent, MD  Allergies Niacin and related, Penicillins, and Rosuvastatin  Family History  Problem Relation Age of Onset  . Alcohol abuse Mother   . Hyperlipidemia Mother   . Hypertension Mother   . Hypertension Father   . Hyperlipidemia Father   . Colon cancer Paternal Uncle   . Prostate cancer Neg Hx   . Kidney disease Neg Hx   . Allergic rhinitis Neg Hx   . Asthma Neg Hx   . Kidney cancer Neg Hx   . Bladder Cancer Neg Hx     Social History Social History   Tobacco Use  . Smoking status: Never Smoker  . Smokeless tobacco: Never Used  Vaping Use  .  Vaping Use: Never used  Substance Use Topics  . Alcohol use: Yes    Alcohol/week: 0.0 standard drinks    Comment: rarely   . Drug use: No    Review of Systems  Constitutional: Negative for fever, chills or body aches. Cardiovascular: Negative for chest pain or chest tightness. Respiratory: Negative for cough or shortness of breath. Musculoskeletal: Positive for left thumb pain.  Negative for wrist, elbow or shoulder pain. Skin: Positive for laceration to left thumb. Neurological: Negative for focal weakness, tingling or numbness. ____________________________________________  PHYSICAL EXAM:  VITAL SIGNS: ED Triage Vitals  Enc Vitals Group     BP 06/30/20 1437 (!) 139/95     Pulse Rate 06/30/20 1437 97     Resp 06/30/20 1437 16     Temp 06/30/20 1437 98.4 F (36.9 C)     Temp Source 06/30/20 1437 Oral     SpO2 06/30/20 1437 96 %     Weight 06/30/20 1431 225 lb (102.1 kg)     Height 06/30/20 1431 5\' 8"  (1.727 m)     Head Circumference --      Peak Flow --      Pain Score 06/30/20 1430 5     Pain Loc --      Pain Edu? --      Excl. in [place]? --     Constitutional: Alert and oriented.  Obese, in no distress. Head: Normocephalic and atraumatic. Eyes:  Normal extraocular movements Cardiovascular: Normal rate, regular rhythm.  Radial pulses 2+ bilaterally Respiratory: Normal respiratory effort. No wheezes/rales/rhonchi. Musculoskeletal: Normal passive flexion and extension of the left thumb. Neurologic:   Normal speech and language. No gross focal neurologic deficits are appreciated. Skin: 1 cm laceration at tip of left thumb extending to left lateral nail edge with damage to the nail.  ____________________________________________   RADIOLOGY  Imaging Orders     DG Finger Thumb Left IMPRESSION:  Soft tissue injury only.    ____________________________________________  PROCEDURES  .Marland KitchenLaceration Repair  Date/Time: 06/30/2020 3:46 PM Performed by: Jearld Fenton,  NP Authorized by: Jearld Fenton, NP   Consent:    Consent obtained:  Verbal   Consent given by:  Patient   Risks, benefits, and alternatives were discussed: yes     Risks discussed:  Infection, pain and poor cosmetic result   Alternatives discussed:  No treatment Universal protocol:    Procedure explained and questions answered to patient or proxy's satisfaction: yes     Relevant documents present and verified: yes     Test results available: yes     Imaging studies available: yes     Required blood products, implants, devices, and special equipment available: yes     Site/side marked: yes     Immediately prior to procedure, a time  out was called: yes     Patient identity confirmed:  Verbally with patient and arm band Anesthesia:    Anesthesia method:  Local infiltration   Local anesthetic:  Lidocaine 1% w/o epi Laceration details:    Location:  Finger   Finger location:  L thumb   Length (cm):  1 Pre-procedure details:    Preparation:  Patient was prepped and draped in usual sterile fashion Exploration:    Limited defect created (wound extended): no     Hemostasis achieved with:  Direct pressure   Imaging obtained: x-ray     Imaging outcome: foreign body not noted     Wound exploration: wound explored through full range of motion     Contaminated: yes   Treatment:    Area cleansed with:  Povidone-iodine   Amount of cleaning:  Extensive   Irrigation method:  Pressure wash   Visualized foreign bodies/material removed: no     Debridement:  Minimal   Undermining:  None   Scar revision: no   Skin repair:    Repair method:  Sutures   Suture size:  4-0   Suture material:  Nylon   Number of sutures:  4 Approximation:    Approximation:  Close Repair type:    Repair type:  Intermediate Post-procedure details:    Dressing:  Antibiotic ointment and splint for protection   Procedure completion:  Tolerated well, no immediate  complications    ____________________________________________  INITIAL IMPRESSION / ASSESSMENT AND PLAN / ED COURSE  Laceration of Left Thumb:  Xray left thumb shows no bony abnormality Laceration repair performed, placed in splint for protection Septra DS 1 tab PO x 1 Tdap today RX for Septra DS BID x 7 days Aftercare instructions given     I reviewed the patient's prescription history over the last 12 months in the multi-state controlled substances database(s) that includes [place], [place], [place], [place], [place], [place], [place], [place], [place], [place], [place], [place], [place], and [place].  Results were notable for no recent controlled substances. ____________________________________________  FINAL CLINICAL IMPRESSION(S) / ED DIAGNOSES  Final diagnoses:  Laceration of left thumb without foreign body with damage to nail, initial encounter      Jearld Fenton, NP 06/30/20 1551    Lavonia Drafts, MD 07/01/20 1524

## 2020-06-30 NOTE — Discharge Instructions (Addendum)
You were seen today for thumb laceration.  Your x-ray was negative for bony abnormality.  You may take the splint off while you shower.  Please avoid submerging your left thumb in water until the sutures are out.  Take your antibiotic as prescribed and watch for increased pain, redness, swelling or purulent discharge.  Follow-up with your PCP in 1 week for suture removal.

## 2020-06-30 NOTE — ED Notes (Addendum)
Neosporin applied to right thumb as ordered. Wound care performed to thumb.

## 2020-07-09 ENCOUNTER — Ambulatory Visit: Payer: BC Managed Care – PPO | Admitting: Internal Medicine

## 2020-07-10 ENCOUNTER — Ambulatory Visit: Payer: BC Managed Care – PPO | Admitting: Internal Medicine

## 2020-07-10 ENCOUNTER — Other Ambulatory Visit: Payer: Self-pay

## 2020-07-10 ENCOUNTER — Encounter: Payer: Self-pay | Admitting: Internal Medicine

## 2020-07-10 VITALS — BP 130/82 | HR 71 | Temp 97.5°F | Wt 232.0 lb

## 2020-07-10 DIAGNOSIS — Z4802 Encounter for removal of sutures: Secondary | ICD-10-CM

## 2020-07-10 DIAGNOSIS — S61112D Laceration without foreign body of left thumb with damage to nail, subsequent encounter: Secondary | ICD-10-CM | POA: Diagnosis not present

## 2020-07-10 NOTE — Patient Instructions (Signed)
Suture Removal, Care After This sheet gives you information about how to care for yourself after your procedure. Your health care provider may also give you more specific instructions. If you have problems or questions, contact your health care provider. What can I expect after the procedure? After your stitches (sutures) are removed, it is common to have:  Some discomfort and swelling in the area.  Slight redness in the area. Follow these instructions at home: If you have a bandage:  Wash your hands with soap and water before you change your bandage (dressing). If soap and water are not available, use hand sanitizer.  Change your dressing as told by your health care provider. If your dressing becomes wet or dirty, or develops a bad smell, change it as soon as possible.  If your dressing sticks to your skin, soak it in warm water to loosen it. Wound care  Check your wound every day for signs of infection. Check for: ? More redness, swelling, or pain. ? Fluid or blood. ? Warmth. ? Pus or a bad smell.  Wash your hands with soap and water before and after touching your wound.  Apply cream or ointment only as directed by your health care provider. If you are using cream or ointment, wash the area with soap and water 2 times a day to remove all the cream or ointment. Rinse off the soap and pat the area dry with a clean towel.  If you have skin glue or adhesive strips on your wound, leave these closures in place. They may need to stay in place for 2 weeks or longer. If adhesive strip edges start to loosen and curl up, you may trim the loose edges. Do not remove adhesive strips completely unless your health care provider tells you to do that.  Keep the wound area dry and clean. Do not take baths, swim, or use a hot tub until your health care provider approves.  Continue to protect the wound from injury.  Do not pick at your wound. Picking can cause an infection.  When your wound has  completely healed, wear sunscreen over it or cover it with clothing when you are outside. New scars get sunburned easily, which can make scarring worse.   General instructions  Take over-the-counter and prescription medicines only as told by your health care provider.  Keep all follow-up visits as told by your health care provider. This is important. Contact a health care provider if:  You have redness, swelling, or pain around your wound.  You have fluid or blood coming from your wound.  Your wound feels warm to the touch.  You have pus or a bad smell coming from your wound.  Your wound opens up. Get help right away if:  You have a fever.  You have redness that is spreading from your wound. Summary  After your sutures are removed, it is common to have some discomfort and swelling in the area.  Wash your hands with soap and water before you change your bandage (dressing).  Keep the wound area dry and clean. Do not take baths, swim, or use a hot tub until your health care provider approves. This information is not intended to replace advice given to you by your health care provider. Make sure you discuss any questions you have with your health care provider. Document Revised: 02/10/2020 Document Reviewed: 02/10/2020 Elsevier Patient Education  2021 [employer].

## 2020-07-10 NOTE — Progress Notes (Signed)
Subjective:    Patient ID: Joseph Drake, male    DOB: [birth year], 60 y.o.   MRN: [id number]  HPI  Patient presents to clinic today for ER follow-up.  He went to the ER 3/5 after cutting his thumb on hedge trimmers.  He received a tdap in the ER. His laceration was repaired with 4 sutures.  He was placed on Septra for infection prophylaxis.  He was discharged and advised to follow-up with his PCP in 1 week for suture removal.  Since discharge, he reports things seem to be healing well. He has not noticed any redness, warmth or discharge. He does reports some numbness to the lateral aspect of the left thumb.  Review of Systems      Past Medical History:  Diagnosis Date  . Acid reflux   . Allergy   . BPH (benign prostatic hypertrophy)   . Candida infection   . Dyspnea   . ED (erectile dysfunction)   . Genital herpes   . History of continuous positive airway pressure (CPAP) therapy   . HTN (hypertension)   . Hydronephrosis   . Hyperlipidemia   . Kidney stone on left side   . LVH (left ventricular hypertrophy)   . Obesity   . Palpitation    Holter in May 2014 with NSR, PVC, rare PACs  . Palpitation   . Sleep apnea   . Testosterone deficiency     Current Outpatient Medications  Medication Sig Dispense Refill  . benazepril (LOTENSIN) 5 MG tablet Take 1 tablet (5 mg total) by mouth daily. 90 tablet 3  . cetirizine (ZYRTEC) 10 MG tablet Take 10 mg by mouth daily.    . colchicine 0.6 MG tablet TAKE ONE TABLET BY MOUTH DAILY AS NEEDED FOR GOUT.  Okay to fill with mitigare or colcrys also. 30 tablet 2  . escitalopram (LEXAPRO) 10 MG tablet Take 1 tablet (10 mg total) by mouth daily. 90 tablet 3  . ezetimibe-simvastatin (VYTORIN) 10-20 MG tablet Take 1 tablet by mouth daily. 90 tablet 3  . fluticasone (FLONASE) 50 MCG/ACT nasal spray Place 2 sprays into both nostrils daily.    Marland Kitchen omeprazole (PRILOSEC) 20 MG capsule Take 20 mg by mouth daily.    Marland Kitchen sulfamethoxazole-trimethoprim  (BACTRIM DS) 800-160 MG tablet Take 1 tablet by mouth 2 (two) times daily. 14 tablet 0  . valACYclovir (VALTREX) 500 MG tablet Take 1 tablet (500 mg total) by mouth daily. 90 tablet 3   No current facility-administered medications for this visit.    Allergies  Allergen Reactions  . Niacin And Related Hives  . Penicillins     rash  . Rosuvastatin     REACTION: myalgia    Family History  Problem Relation Age of Onset  . Alcohol abuse Mother   . Hyperlipidemia Mother   . Hypertension Mother   . Hypertension Father   . Hyperlipidemia Father   . Colon cancer Paternal Uncle   . Prostate cancer Neg Hx   . Kidney disease Neg Hx   . Allergic rhinitis Neg Hx   . Asthma Neg Hx   . Kidney cancer Neg Hx   . Bladder Cancer Neg Hx     Social History   Socioeconomic History  . Marital status: Married    Spouse name: Not on file  . Number of children: Not on file  . Years of education: Not on file  . Highest education level: Not on file  Occupational History  . Not on  file  Tobacco Use  . Smoking status: Never Smoker  . Smokeless tobacco: Never Used  Vaping Use  . Vaping Use: Never used  Substance and Sexual Activity  . Alcohol use: Yes    Alcohol/week: 0.0 standard drinks    Comment: rarely   . Drug use: No  . Sexual activity: Yes  Other Topics Concern  . Not on file  Social History Narrative   Drafting, structural Chief Strategy Officer for bridges   Remarried 2002   1 daughter from 1st marriage   2 stepkids from wife's first marriage   Tourist information centre manager   Social Determinants of Health   Financial Resource Strain: Not on file  Food Insecurity: Not on file  Transportation Needs: Not on file  Physical Activity: Not on file  Stress: Not on file  Social Connections: Not on file  Intimate Partner Violence: Not on file     Constitutional: Denies fever, malaise, fatigue, headache or abrupt weight changes.  Respiratory: Denies difficulty breathing, shortness of breath, cough or  sputum production.   Cardiovascular: Denies chest pain, chest tightness, palpitations or swelling in the hands or feet.  Musculoskeletal: Denies decrease in range of motion, difficulty with gait, muscle pain or joint pain and swelling.  Skin: Patient reports laceration to left thumb.  Denies redness, rashes, lesions or ulcercations.  Neurological: Pt reports numbness to left thumb. Denies dizziness, difficulty with memory, difficulty with speech or problems with balance and coordination.    No other specific complaints in a complete review of systems (except as listed in HPI above).  Objective:   Physical Exam  BP 130/82   Pulse 71   Temp (!) 97.5 F (36.4 C) (Temporal)   Wt 232 lb (105.2 kg)   SpO2 97%   BMI 35.28 kg/m   Wt Readings from Last 3 Encounters:  06/30/20 225 lb (102.1 kg)  08/24/19 225 lb (102.1 kg)  08/18/19 232 lb 1 oz (105.3 kg)    General: Appears his stated age, obese, in NAD. Skin: Healed laceration to tip of left thumb. Cardiovascular: Normal rate. Radial pulse 2+ on the right. Pulmonary/Chest: Normal effort. Musculoskeletal: Normal flexion and extension of the left thumb. Neurological: Numbness noted to tip of left thumb.   BMET    Component Value Date/Time   NA 137 08/12/2019 0740   K 4.1 08/12/2019 0740   CL 104 08/12/2019 0740   CO2 24 08/12/2019 0740   GLUCOSE 105 (H) 08/12/2019 0740   BUN 18 08/12/2019 0740   CREATININE 1.10 08/12/2019 0740   CALCIUM 8.4 08/12/2019 0740   GFRNONAA 57 (L) 07/15/2014 0851   GFRAA 66 (L) 07/15/2014 0851    Lipid Panel     Component Value Date/Time   CHOL 140 08/12/2019 0740   TRIG 197.0 (H) 08/12/2019 0740   TRIG 126 04/15/2006 1015   HDL 27.10 (L) 08/12/2019 0740   CHOLHDL 5 08/12/2019 0740   VLDL 39.4 08/12/2019 0740   LDLCALC 73 08/12/2019 0740    CBC    Component Value Date/Time   WBC 9.6 07/15/2014 0851   RBC 5.02 07/15/2014 0851   HGB 15.4 07/15/2014 0851   HCT 44.4 07/15/2014 0851    PLT 217 07/15/2014 0851   MCV 88.4 07/15/2014 0851   MCH 30.7 07/15/2014 0851   MCHC 34.7 07/15/2014 0851   RDW 12.8 07/15/2014 0851   LYMPHSABS 1.6 07/15/2014 0851   MONOABS 0.6 07/15/2014 0851   EOSABS 0.1 07/15/2014 0851   BASOSABS 0.0 07/15/2014 0851  Hgb A1C No results found for: HGBA1C         Assessment & Plan:   ER follow-up for Laceration of Left Thumb:  ER notes, labs and imaging reviewed Sutures removed, patient tolerated well Aftercare instructions given  Return precautions discussed Webb Silversmith, NP This visit occurred during the SARS-CoV-2 public health emergency.  Safety protocols were in place, including screening questions prior to the visit, additional usage of staff PPE, and extensive cleaning of exam room while observing appropriate contact time as indicated for disinfecting solutions.

## 2020-08-14 ENCOUNTER — Other Ambulatory Visit: Payer: Self-pay | Admitting: Family Medicine

## 2020-08-14 DIAGNOSIS — N138 Other obstructive and reflux uropathy: Secondary | ICD-10-CM

## 2020-10-01 ENCOUNTER — Encounter (HOSPITAL_BASED_OUTPATIENT_CLINIC_OR_DEPARTMENT_OTHER): Payer: Self-pay | Admitting: Emergency Medicine

## 2020-10-01 ENCOUNTER — Other Ambulatory Visit: Payer: Self-pay

## 2020-10-01 ENCOUNTER — Telehealth: Payer: Self-pay | Admitting: *Deleted

## 2020-10-01 ENCOUNTER — Emergency Department (HOSPITAL_BASED_OUTPATIENT_CLINIC_OR_DEPARTMENT_OTHER): Payer: BC Managed Care – PPO

## 2020-10-01 ENCOUNTER — Emergency Department (HOSPITAL_BASED_OUTPATIENT_CLINIC_OR_DEPARTMENT_OTHER)
Admission: EM | Admit: 2020-10-01 | Discharge: 2020-10-01 | Disposition: A | Discharge status: Home / Self Care | Payer: BC Managed Care – PPO | Attending: Emergency Medicine | Admitting: Emergency Medicine

## 2020-10-01 DIAGNOSIS — R5383 Other fatigue: Secondary | ICD-10-CM | POA: Insufficient documentation

## 2020-10-01 DIAGNOSIS — J3489 Other specified disorders of nose and nasal sinuses: Secondary | ICD-10-CM | POA: Diagnosis not present

## 2020-10-01 DIAGNOSIS — R059 Cough, unspecified: Secondary | ICD-10-CM | POA: Diagnosis not present

## 2020-10-01 DIAGNOSIS — I10 Essential (primary) hypertension: Secondary | ICD-10-CM | POA: Diagnosis not present

## 2020-10-01 DIAGNOSIS — J029 Acute pharyngitis, unspecified: Secondary | ICD-10-CM | POA: Diagnosis not present

## 2020-10-01 DIAGNOSIS — R0981 Nasal congestion: Secondary | ICD-10-CM | POA: Insufficient documentation

## 2020-10-01 DIAGNOSIS — Z20822 Contact with and (suspected) exposure to covid-19: Secondary | ICD-10-CM | POA: Insufficient documentation

## 2020-10-01 DIAGNOSIS — Z79899 Other long term (current) drug therapy: Secondary | ICD-10-CM | POA: Insufficient documentation

## 2020-10-01 LAB — RESP PANEL BY RT-PCR (FLU A&B, COVID) ARPGX2
Influenza A by PCR: NEGATIVE
Influenza B by PCR: NEGATIVE
SARS Coronavirus 2 by RT PCR: NEGATIVE

## 2020-10-01 IMAGING — DX DG CHEST 1V PORT
1 series · 1 of 1 positions shown · non-contrast
Comparison: None.

CLINICAL DATA: Cough and chest congestion for 1 week.

EXAM:
PORTABLE CHEST 1 VIEW

[chest ap]
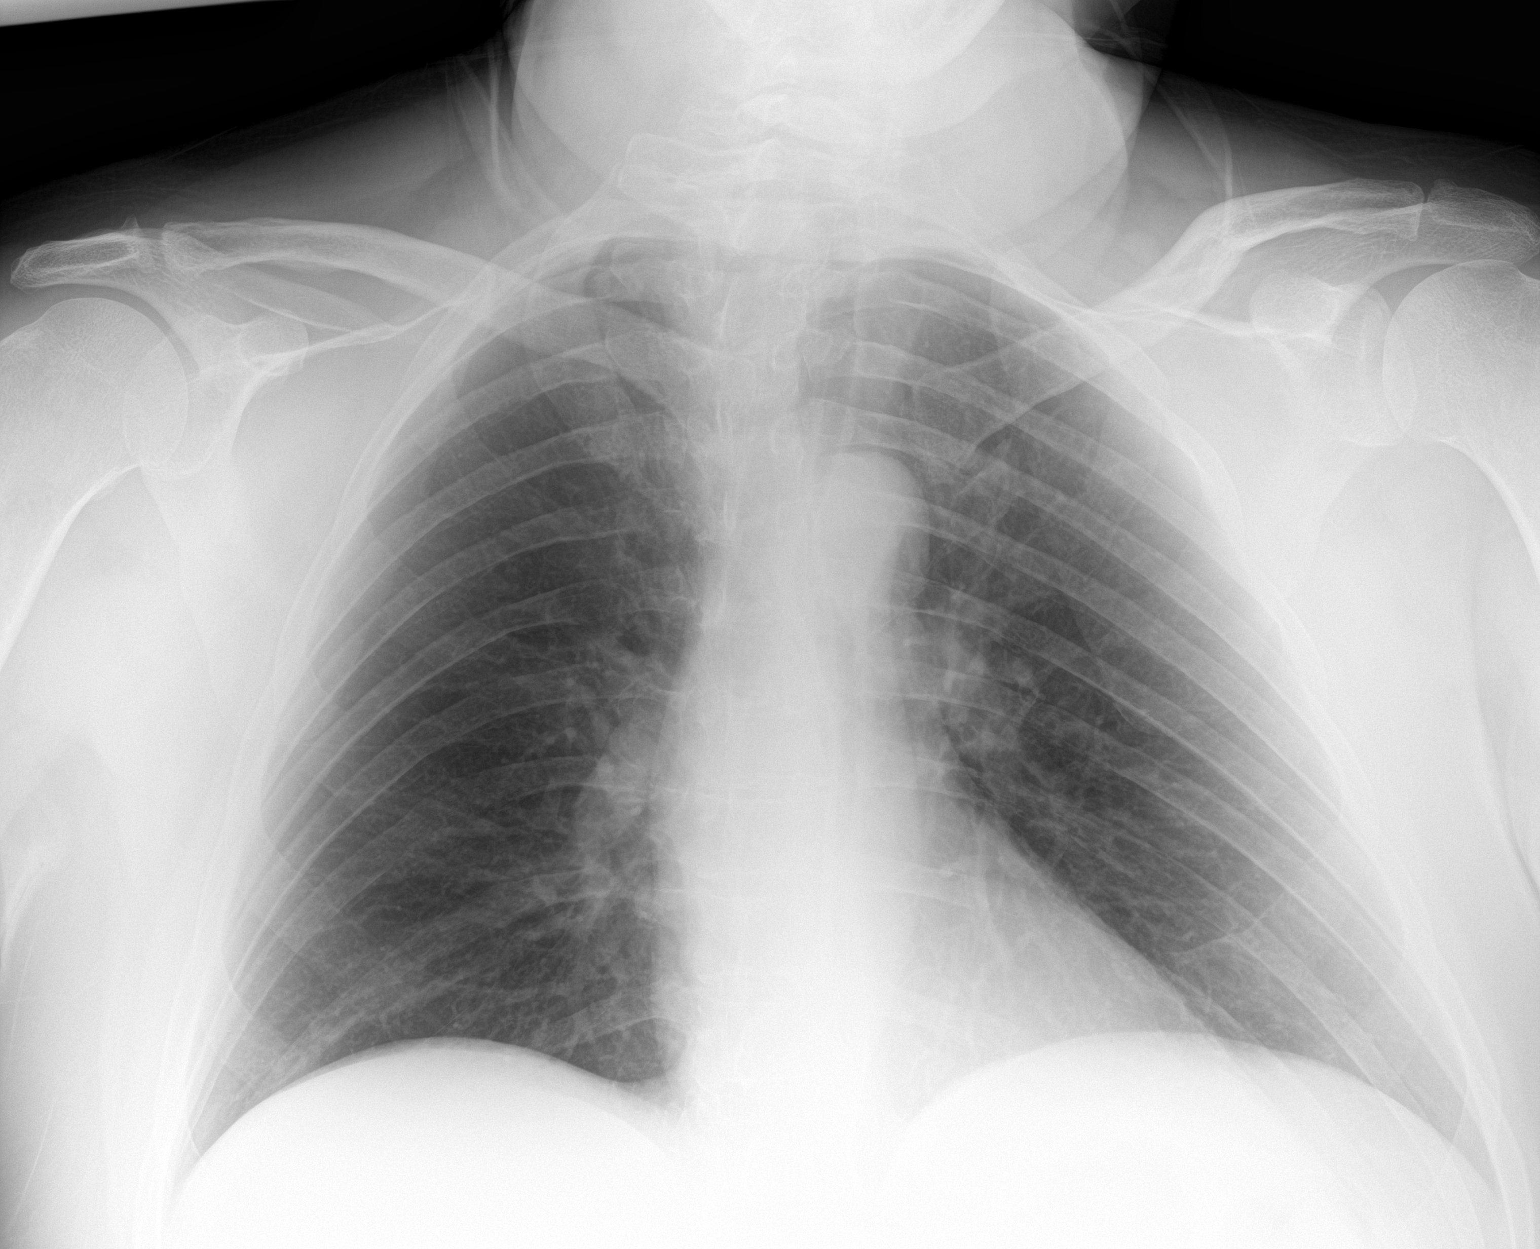

[1 of 1 positions shown; findings below may reference images not displayed]

FINDINGS: The lungs are clear. Heart size is normal. No pneumothorax or
pleural fluid. No acute or focal bony abnormality.
IMPRESSION: Negative chest.

## 2020-10-01 MED ORDER — PREDNISONE 20 MG PO TABS: 40.0000 mg | ORAL_TABLET | Freq: Every day | ORAL | 0 refills | Status: AC

## 2020-10-01 NOTE — Discharge Instructions (Signed)
Take prednisone as directed.  As we discussed, you have a COVID and flu test pending.  You can check online or on the [place] regarding results.  Return to the emergency department for any chest pain, difficulty breathing or any other worsening concerning symptoms.

## 2020-10-01 NOTE — ED Provider Notes (Signed)
[facility] EMERGENCY DEPARTMENT Provider Note   CSN: [id number] Arrival date & time: 10/01/20  1119     History Chief Complaint  Patient presents with  . Cough    Joseph Drake is a 60 y.o. male with PMH/o obesity, HTN, HLD who presents for evaluation of cough, fatigue that is been ongoing for about 1 week.  He states initially symptoms started off from a cough.  He thought he might have been exposed to some dust.  He did [facility] into a [facility] about 2 days later and had a COVID test that was negative.  He states that he was still having symptoms.  He states that over the week, it has progressed to fatigue.  He states that he just feels tired and worn out.  He is also had some nasal congestion, rhinorrhea.  He also reports this morning, he started developing some sore throat which he thought was due to his CPAP machine.  Patient reports that the cough is productive of green phlegm.  No hemoptysis.  He denies any fevers, chest pain, difficulty breathing.  He has been vaccinated for COVID x3.  No COVID exposure that he knows of.  He does not smoke.  No history of COPD or asthma.  The history is provided by the patient.       Past Medical History:  Diagnosis Date  . Acid reflux   . Allergy   . BPH (benign prostatic hypertrophy)   . Candida infection   . Dyspnea   . ED (erectile dysfunction)   . Genital herpes   . History of continuous positive airway pressure (CPAP) therapy   . HTN (hypertension)   . Hydronephrosis   . Hyperlipidemia   . Kidney stone on left side   . LVH (left ventricular hypertrophy)   . Obesity   . Palpitation    Holter in May 2014 with NSR, PVC, rare PACs  . Palpitation   . Sleep apnea   . Testosterone deficiency     Patient Active Problem List   Diagnosis Date Noted  . Routine general medical examination at a health care facility 08/23/2019  . Anxiety 09/17/2016  . GERD (gastroesophageal reflux disease) 08/10/2016  . Rosacea  08/10/2016  . Laryngopharyngeal reflux (LPR) 06/09/2016  . Hypertrophy, nasal, turbinate 05/01/2016  . Gout 01/16/2016  . BPH with obstruction/lower urinary tract symptoms 09/21/2015  . 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 Advance care planning 07/09/2015  . Knee pain 07/09/2015  . Essential hypertension 04/23/2010  . Obstructive sleep apnea 03/13/2010  . VENTRICULAR HYPERTROPHY, LEFT 03/13/2010  . CANDIDIASIS, UROGENITAL 10/12/2009  . Genital herpes 11/27/2008  . HYPERSOMNIA, ASSOCIATED WITH SLEEP APNEA 08/29/2008  . ALLERGIC RHINITIS 06/27/2008  . TESTOSTERONE DEFICIENCY 01/28/2008  . HLD (hyperlipidemia) 09/23/2006    Past Surgical History:  Procedure Laterality Date  . CIRCUMCISION    . NASAL SEPTUM SURGERY         Family History  Problem Relation Age of Onset  . Alcohol abuse Mother   . Hyperlipidemia Mother   . Hypertension Mother   . Hypertension Father   . Hyperlipidemia Father   . Colon cancer Paternal Uncle   . Prostate cancer Neg Hx   . Kidney disease Neg Hx   . Allergic rhinitis Neg Hx   . Asthma Neg Hx   . Kidney cancer Neg Hx   . Bladder Cancer Neg Hx     Social History   Tobacco Use  . Smoking  status: Never Smoker  . Smokeless tobacco: Never Used  Vaping Use  . Vaping Use: Never used  Substance Use Topics  . Alcohol use: Yes    Alcohol/week: 0.0 standard drinks    Comment: rarely   . Drug use: No    Home Medications Prior to Admission medications   Medication Sig Start Date End Date Taking? Authorizing Provider  predniSONE (DELTASONE) 20 MG tablet Take 2 tablets (40 mg total) by mouth daily for 4 days. 10/01/20 10/05/20 Yes Providence Lanius A, PA-C  benazepril (LOTENSIN) 5 MG tablet Take 1 tablet (5 mg total) by mouth daily. 08/18/19   Tonia Ghent, MD  cetirizine (ZYRTEC) 10 MG tablet Take 10 mg by mouth daily.    [provider]  colchicine 0.6 MG tablet TAKE ONE TABLET BY MOUTH DAILY AS NEEDED FOR GOUT.  Okay to  fill with mitigare or colcrys also. 08/18/19   Tonia Ghent, MD  escitalopram (LEXAPRO) 10 MG tablet Take 1 tablet (10 mg total) by mouth daily. 08/18/19   Tonia Ghent, MD  ezetimibe-simvastatin (VYTORIN) 10-20 MG tablet Take 1 tablet by mouth daily. 08/18/19   Tonia Ghent, MD  fluticasone Asencion Islam) 50 MCG/ACT nasal spray Place 2 sprays into both nostrils daily.    [provider]  omeprazole (PRILOSEC) 20 MG capsule Take 20 mg by mouth daily.    [provider]  valACYclovir (VALTREX) 500 MG tablet Take 1 tablet (500 mg total) by mouth daily. 08/18/19   Tonia Ghent, MD    Allergies    Niacin and related, Penicillins, and Rosuvastatin  Review of Systems   Review of Systems  Constitutional: Positive for fever.  HENT: Positive for congestion, rhinorrhea and sore throat.   Respiratory: Positive for cough. Negative for shortness of breath.   Cardiovascular: Negative for chest pain.  Gastrointestinal: Negative for abdominal pain, nausea and vomiting.  Genitourinary: Negative for dysuria and hematuria.  Neurological: Negative for headaches.  All other systems reviewed and are negative.   Physical Exam Updated Vital Signs BP 127/87   Pulse 76   Temp 98.6 F (37 C) (Oral)   Resp 15   Ht 5\' 9"  (1.753 m)   Wt 101.2 kg   SpO2 95%   BMI 32.93 kg/m   Physical Exam Vitals and nursing note reviewed.  Constitutional:      Appearance: He is well-developed.  HENT:     Head: Normocephalic and atraumatic.     Mouth/Throat:     Pharynx: Oropharynx is clear.     Comments: Posterior oropharynx is clear.  Airway is patent, phonation is intact.  Uvula is midline. Eyes:     General: No scleral icterus.       Right eye: No discharge.        Left eye: No discharge.     Conjunctiva/sclera: Conjunctivae normal.  Pulmonary:     Effort: Pulmonary effort is normal.     Breath sounds: Rhonchi present.     Comments: Mild rhonchi, mainly in the upper lung fields.  No  wheezing.  No evidence of respiratory stress.  Able speak in full sentences without any difficulty. Skin:    General: Skin is warm and dry.  Neurological:     Mental Status: He is alert.  Psychiatric:        Speech: Speech normal.        Behavior: Behavior normal.     ED Results / Procedures / Treatments   Labs (all  labs ordered are listed, but only abnormal results are displayed) Labs Reviewed  RESP PANEL BY RT-PCR (FLU A&B, COVID) [id number]    EKG None  Radiology DG Chest Port 1 View  Result Date: 10/01/2020 CLINICAL DATA:  Cough and chest congestion for 1 week. EXAM: PORTABLE CHEST 1 VIEW COMPARISON:  None. FINDINGS: The lungs are clear. Heart size is normal. No pneumothorax or pleural fluid. No acute or focal bony abnormality. IMPRESSION: Negative chest. Electronically Signed   By: Inge Rise M.D.   On: 10/01/2020 13:14    Procedures Procedures   Medications Ordered in ED Medications - No data to display  ED Course  I have reviewed the triage vital signs and the nursing notes.  Pertinent labs & imaging results that were available during my care of the patient were reviewed by me and considered in my medical decision making (see chart for details).    MDM Rules/Calculators/A&P                          45 -year-old male who presents for evaluation of cough x1 week.  Associated with rhinorrhea, congestion, sore throat.  Also reports fatigue.  No chest pain, shortness of breath.  Called PCP today and advised to come to the emergency department.  On initial arrival, he is afebrile, nontoxic-appearing.  Vital signs are stable.  No hypoxia noted.  He does have some mild rhonchi noted.  No evidence of respiratory distress.  Consider viral illness such as COVID-19.  Also consider pneumonia.  Plan to check x-ray, COVID test.  Chest x-ray negative for any acute infectious etiology.  Discussed results with patient.  Patient is hemodynamically stable here in the ED.  He has a  COVID influenza test pending.  At this time, patient is stable for discharge home.  We will give patient a short course of prednisone to help with cough.  Patient instructed to follow-up with his COVID test. At this time, patient exhibits no emergent life-threatening condition that require further evaluation in ED. Patient had ample opportunity for questions and discussion. All patient's questions were answered with full understanding. Strict return precautions discussed. Patient expresses understanding and agreement to plan.   Joseph Drake was evaluated in Emergency Department on 10/01/2020 for the symptoms described in the history of present illness. He was evaluated in the context of the global COVID-19 pandemic, which necessitated consideration that the patient might be at risk for infection with the SARS-CoV-2 virus that causes COVID-19. Institutional protocols and algorithms that pertain to the evaluation of patients at risk for COVID-19 are in a state of rapid change based on information released by regulatory bodies including the CDC and federal and state organizations. These policies and algorithms were followed during the patient's care in the ED.  Portions of this note were generated with [employer]. Dictation errors may occur despite best attempts at proofreading.   Final Clinical Impression(s) / ED Diagnoses Final diagnoses:  Cough    Rx / DC Orders ED Discharge Orders         Ordered    predniSONE (DELTASONE) 20 MG tablet  Daily        10/01/20 1447           Desma Mcgregor 10/01/20 1513    Hayden Rasmussen, MD 10/01/20 1933

## 2020-10-01 NOTE — ED Triage Notes (Signed)
Cough x 1 week, chest congestion. Denies chest pain. Denies shortness of breath, sent by PCP to test for covid and chest xray.

## 2020-10-01 NOTE — Telephone Encounter (Signed)
Will await ER notes.  Thanks.

## 2020-10-01 NOTE — Telephone Encounter (Signed)
Patient is currently at ED

## 2020-10-01 NOTE — Telephone Encounter (Signed)
PLEASE NOTE: All timestamps contained within this report are represented as [place] Standard Time. CONFIDENTIALTY NOTICE: This fax transmission is intended only for the addressee. It contains information that is legally privileged, confidential or otherwise protected from use or disclosure. If you are not the intended recipient, you are strictly prohibited from reviewing, disclosing, copying using or disseminating any of this information or taking any action in reliance on or regarding this information. If you have received this fax in error, please notify us immediately by telephone so that we can arrange for its return to [place]. Phone: [phone], Toll-Free: [phone], Fax: [phone] Page: 1 of 2 Call Id: [id number] Ramona Day - Client TELEPHONE ADVICE RECORD [facility] Patient Name: Joseph Drake Gender: Male DOB: 1961-01-30 Age: 60 Y 11 M 30 D Return Phone Number: [phone] (Primary) Address: City/ State/ Zip: [place]  [address] Client [facility] Day - Client Client Site [place] Physician Renford Dills - MD Contact Type Call Who Is Calling Patient / Member / Family / Caregiver Call Type Triage / Clinical Relationship To Patient Self Return Phone Number [phone] (Primary) Chief Complaint Cough Reason for Call Symptomatic / Request for Health Information Initial Comment Caller states he has Sx of deep cough, fatigue, tightness in his chest. [place] Not Listed [facility] Translation No Nurse Assessment Nurse: Vallery Sa, RN, Cathy Date/Time (Eastern Time): 10/01/2020 10:43:06 AM Confirm and document reason for call. If symptomatic, describe symptoms. ---Dellis Filbert states that he developed cold, cough symptoms about a week ago. No severe breathing difficulty or blueness around his lips. He developed chest tightness abut a week ago (current pain rated as a 5 on  the 1 to 10 scale). Alert and responsive. Negative COVID home test about a week ago. Does the patient have any new or worsening symptoms? ---Yes Will a triage be completed? ---Yes Related visit to physician within the last 2 weeks? ---No Does the PT have any chronic conditions? (i.e. diabetes, asthma, this includes High risk factors for pregnancy, etc.) ---Yes List chronic conditions. ---High Blood Pressure, Sleep Apnea, High Cholesterol, Left Ventricular Hypertrophy Is this a behavioral health or substance abuse call? ---No Guidelines Guideline Title Affirmed Question Affirmed Notes Nurse Date/Time (Eastern Time) COVID-19 - Diagnosed or Suspected SEVERE or constant chest pain or pressure (Exception: Mild central chest pain, present only when coughing.) Vallery Sa, RN, Cathy 10/01/2020 10:46:43 AM PLEASE NOTE: All timestamps contained within this report are represented as [place] Standard Time. CONFIDENTIALTY NOTICE: This fax transmission is intended only for the addressee. It contains information that is legally privileged, confidential or otherwise protected from use or disclosure. If you are not the intended recipient, you are strictly prohibited from reviewing, disclosing, copying using or disseminating any of this information or taking any action in reliance on or regarding this information. If you have received this fax in error, please notify us immediately by telephone so that we can arrange for its return to [place]. Phone: [phone], Toll-Free: [phone], Fax: [phone] Page: 2 of 2 Call Id: [id number] [place]. Time Eilene Ghazi Time) Disposition Final User 10/01/2020 10:39:40 AM Send to Urgent Mellody Dance 10/01/2020 10:47:46 AM Go to ED Now Yes Vallery Sa, RN, Rosey Bath Disagree/Comply Comply Caller Understands Yes PreDisposition Call Doctor Care Advice Given Per Guideline GO TO ED NOW: * You need to be seen in the Emergency Department. * Go to the ED at ___________  [facility] now. Drive carefully. CALL  EMS 911 IF: * Severe difficulty breathing occurs * Lips or face turns blue * Confusion occurs. CARE ADVICE given per COVID-19 - DIAGNOSED OR SUSPECTED (Adult) guideline. Referrals GO TO FACILITY OTHER - SPECIFY

## 2020-10-05 ENCOUNTER — Ambulatory Visit: Payer: BC Managed Care – PPO | Admitting: Family Medicine

## 2020-10-09 ENCOUNTER — Ambulatory Visit: Payer: Self-pay | Admitting: Urology

## 2020-11-04 ENCOUNTER — Other Ambulatory Visit: Payer: Self-pay | Admitting: Family Medicine

## 2020-11-04 DIAGNOSIS — Z125 Encounter for screening for malignant neoplasm of prostate: Secondary | ICD-10-CM

## 2020-11-04 DIAGNOSIS — M109 Gout, unspecified: Secondary | ICD-10-CM

## 2020-11-04 DIAGNOSIS — I10 Essential (primary) hypertension: Secondary | ICD-10-CM

## 2020-11-13 ENCOUNTER — Other Ambulatory Visit (INDEPENDENT_AMBULATORY_CARE_PROVIDER_SITE_OTHER): Payer: BC Managed Care – PPO

## 2020-11-13 ENCOUNTER — Other Ambulatory Visit: Payer: Self-pay

## 2020-11-13 DIAGNOSIS — M109 Gout, unspecified: Secondary | ICD-10-CM | POA: Diagnosis not present

## 2020-11-13 DIAGNOSIS — Z125 Encounter for screening for malignant neoplasm of prostate: Secondary | ICD-10-CM

## 2020-11-13 DIAGNOSIS — I10 Essential (primary) hypertension: Secondary | ICD-10-CM

## 2020-11-13 LAB — COMPREHENSIVE METABOLIC PANEL
ALT: 26 U/L (ref 0–53)
AST: 20 U/L (ref 0–37)
Albumin: 4.2 g/dL (ref 3.5–5.2)
Alkaline Phosphatase: 91 U/L (ref 39–117)
BUN: 17 mg/dL (ref 6–23)
CO2: 25 mEq/L (ref 19–32)
Calcium: 8.7 mg/dL (ref 8.4–10.5)
Chloride: 104 mEq/L (ref 96–112)
Creatinine, Ser: 1.06 mg/dL (ref 0.40–1.50)
GFR: 75.96 mL/min (ref >60.00)
Glucose, Bld: 117 mg/dL — ABNORMAL HIGH (ref 70–99)
Potassium: 4.1 mEq/L (ref 3.5–5.1)
Sodium: 137 mEq/L (ref 135–145)
Total Bilirubin: 0.6 mg/dL (ref 0.2–1.2)
Total Protein: 6.7 g/dL (ref 6.0–8.3)

## 2020-11-13 LAB — LIPID PANEL
Cholesterol: 139 mg/dL (ref 0–200)
HDL: 31.3 mg/dL — ABNORMAL LOW (ref >39.00)
NonHDL: 107.57
Total CHOL/HDL Ratio: 4
Triglycerides: 219 mg/dL — ABNORMAL HIGH (ref 0.0–149.0)
VLDL: 43.8 mg/dL — ABNORMAL HIGH (ref 0.0–40.0)

## 2020-11-13 LAB — URIC ACID: Uric Acid, Serum: 7.5 mg/dL (ref 4.0–7.8)

## 2020-11-13 LAB — PSA: PSA: 0.59 ng/mL (ref 0.10–4.00)

## 2020-11-13 LAB — LDL CHOLESTEROL, DIRECT: Direct LDL: 93 mg/dL

## 2020-11-15 ENCOUNTER — Ambulatory Visit (INDEPENDENT_AMBULATORY_CARE_PROVIDER_SITE_OTHER): Payer: BC Managed Care – PPO | Admitting: Family Medicine

## 2020-11-15 ENCOUNTER — Encounter: Payer: Self-pay | Admitting: Family Medicine

## 2020-11-15 ENCOUNTER — Other Ambulatory Visit: Payer: Self-pay

## 2020-11-15 VITALS — BP 116/80 | HR 80 | Temp 97.6°F | Ht 69.0 in | Wt 231.0 lb

## 2020-11-15 DIAGNOSIS — M109 Gout, unspecified: Secondary | ICD-10-CM

## 2020-11-15 DIAGNOSIS — G4733 Obstructive sleep apnea (adult) (pediatric): Secondary | ICD-10-CM

## 2020-11-15 DIAGNOSIS — I10 Essential (primary) hypertension: Secondary | ICD-10-CM

## 2020-11-15 DIAGNOSIS — Z0000 Encounter for general adult medical examination without abnormal findings: Secondary | ICD-10-CM

## 2020-11-15 DIAGNOSIS — K219 Gastro-esophageal reflux disease without esophagitis: Secondary | ICD-10-CM

## 2020-11-15 DIAGNOSIS — E785 Hyperlipidemia, unspecified: Secondary | ICD-10-CM

## 2020-11-15 DIAGNOSIS — L989 Disorder of the skin and subcutaneous tissue, unspecified: Secondary | ICD-10-CM

## 2020-11-15 DIAGNOSIS — Z7189 Other specified counseling: Secondary | ICD-10-CM

## 2020-11-15 DIAGNOSIS — H9190 Unspecified hearing loss, unspecified ear: Secondary | ICD-10-CM

## 2020-11-15 DIAGNOSIS — F419 Anxiety disorder, unspecified: Secondary | ICD-10-CM

## 2020-11-15 MED ORDER — ESCITALOPRAM OXALATE 10 MG PO TABS: 10.0000 mg | ORAL_TABLET | Freq: Every day | ORAL | 3 refills | Status: DC

## 2020-11-15 MED ORDER — EZETIMIBE-SIMVASTATIN 10-20 MG PO TABS: 1.0000 | ORAL_TABLET | Freq: Every day | ORAL | 3 refills | Status: DC

## 2020-11-15 MED ORDER — BENAZEPRIL HCL 5 MG PO TABS: 5.0000 mg | ORAL_TABLET | Freq: Every day | ORAL | 3 refills | Status: DC

## 2020-11-15 MED ORDER — VALACYCLOVIR HCL 500 MG PO TABS: 500.0000 mg | ORAL_TABLET | Freq: Every day | ORAL | 3 refills | Status: DC

## 2020-11-15 NOTE — Progress Notes (Signed)
This visit occurred during the SARS-CoV-2 public health emergency.  Safety protocols were in place, including screening questions prior to the visit, additional usage of staff PPE, and extensive cleaning of exam room while observing appropriate contact time as indicated for disinfecting solutions.  CPE- See plan.  Routine anticipatory guidance given to patient.  See health maintenance.  The possibility exists that previously documented standard health maintenance information may have been brought forward from a previous encounter into this note.  If needed, that same information has been updated to reflect the current situation based on today's encounter.    Flu done yearly per patient report, at work.   Tetanus 2022 PNA not due. shingrix 2020 covid vaccine 2021, pfizer. Colonoscopy 2017 PSA 2022.  PSA elevated from prior but still normal- elevated off finasteride now and that likely contributes- d/w pt.   Living will- wife designated if patient were incapacitated.   HCV preg nev HIV screening done, neg, in 1990s.   Diet and exercise, d/w pt.    He was asking about a dermatology referral.  Skin lesion near the R brow.  Mult tags in the axilla.   D/w pt about ENT referral for hearing changes.    Elevated Cholesterol: Using medications without problems:yes Muscle aches: some leg pain at night.  Not with walking. Noted on the B shins, after work.   Diet compliance: encouraged.   D/w pt about PM snacking.   Exercise: encouraged  Hypertension:    Using medication without problems or lightheadedness: yes Chest pain with exertion:no Edema:no Labs d/w pt.    On valtrex daily.  No flares.  D/w pt about options.  No ADE on med and we agreed to continue as is.    Still using CPAP.  Sleeping better with use.  Compliant.  Fatigue clearly better with use.  D/w pt about recheck with pulmonary.    Mood d/w pt.  "I'm good, work is good.  Lexapro helps."  No SI/HI.  No ADE on med.  Wellbutrin  didn't help as much in the past.    GERD. No GERD.  No ADE on omeprazole.    Gout.  No recent flares. D/w pt.    PMH and SH reviewed  Meds, vitals, and allergies reviewed.   ROS: Per HPI.  Unless specifically indicated otherwise in HPI, the patient denies:  General: fever. Eyes: acute vision changes ENT: sore throat Cardiovascular: chest pain Respiratory: SOB GI: vomiting GU: dysuria Musculoskeletal: acute back pain Derm: acute rash Neuro: acute motor dysfunction Psych: worsening mood Endocrine: polydipsia Heme: bleeding Allergy: hayfever  GEN: nad, alert and oriented HEENT: ncat NECK: supple w/o LA CV: rrr. PULM: ctab, no inc wob ABD: soft, +bs EXT: no edema SKIN: no acute rash but possible small BCC near the right brow.  Discussed with patient.  No ulceration.  Separate issue with skin tags noted.

## 2020-11-15 NOTE — Patient Instructions (Addendum)
We'll call about pulmonary evaluation for sleep apnea (and ENT and dermatology). Take care.  Glad to see you. Don't change your meds for now.   Hold vytorin for about 1 week and see if that helps.  If that doesn't help, then try stretching more in the day and restart the vytorin.

## 2020-11-18 DIAGNOSIS — H9190 Unspecified hearing loss, unspecified ear: Secondary | ICD-10-CM | POA: Insufficient documentation

## 2020-11-18 NOTE — Assessment & Plan Note (Signed)
Continue benazepril.  Continue work on diet and exercise.

## 2020-11-18 NOTE — Assessment & Plan Note (Signed)
I asked him to hold ezetimibe/simvastatin and see if his leg symptoms improve.  Continue work on diet and exercise.  He agrees with plan.  See after visit summary.

## 2020-11-18 NOTE — Assessment & Plan Note (Signed)
No recent flares.  No recent colchicine use.  He will update me as needed.

## 2020-11-18 NOTE — Assessment & Plan Note (Signed)
Referral for reevaluation with pulmonary.

## 2020-11-18 NOTE — Assessment & Plan Note (Signed)
Refer to ENT

## 2020-11-18 NOTE — Assessment & Plan Note (Signed)
Controlled.  Continue omeprazole.

## 2020-11-18 NOTE — Assessment & Plan Note (Signed)
Flu done yearly per patient report, at work.   Tetanus 2022 PNA not due. shingrix 2020 covid vaccine 2021, pfizer. Colonoscopy 2017 PSA 2022.  PSA elevated from prior but still normal- elevated off finasteride now and that likely contributes- d/w pt.   Living will- wife designated if patient were incapacitated.   HCV preg nev HIV screening done, neg, in 1990s.   Diet and exercise, d/w pt.

## 2020-11-18 NOTE — Assessment & Plan Note (Signed)
Mood is good.  Continue Lexapro.

## 2020-11-18 NOTE — Assessment & Plan Note (Signed)
Discussed with patient about options.  Differential discussed with patient.  Refer to dermatology.  Referral ordered.

## 2020-11-19 ENCOUNTER — Encounter: Payer: Self-pay | Admitting: Family Medicine

## 2020-11-20 ENCOUNTER — Other Ambulatory Visit: Payer: Self-pay | Admitting: Family Medicine

## 2020-11-20 DIAGNOSIS — H9190 Unspecified hearing loss, unspecified ear: Secondary | ICD-10-CM

## 2020-11-26 ENCOUNTER — Encounter: Payer: Self-pay | Admitting: Urology

## 2020-12-03 ENCOUNTER — Other Ambulatory Visit: Payer: Self-pay

## 2020-12-10 NOTE — Progress Notes (Signed)
12/11/20 3:37 PM   Joseph Drake 31-Dec-1960 [id number]  Referring provider:  Tonia Ghent, MD [address] [place],  [place] [address] Chief Complaint  Patient presents with   Follow-up    1 year follow-up     HPI: Joseph Drake is a 60 y.o.male with BPH/ pelvic floor dysfunction returns today for PSA, DRE, IPSS, PVR, and UA.   He was last seen in office 09/2019 for cystoscopy. He was experiencing hematospermia but did not have further episodes of gross hematuria.   He is doing well. He denies experiencing hematospermia and gross hematuria today. He has been sleeping throughout the night without frequency. Earlier this year he was experiencing a few episodes of urinary hesitancy which he believes to have been psychological.  He has been actively participating in his pelvic floor exercises and states that the "baby pose" pelvic floor exercise has been the most beneficial for his rectal spasms.   PSA trend:  Component     Latest Ref Rng & Units 08/08/2016 08/10/2017 11/13/2020  PSA     0.10 - 4.00 ng/mL 0.19 0.15 0.59      IPSS     Row Name 12/11/20 1500         International Prostate Symptom Score   How often have you had the sensation of not emptying your bladder? Not at All     How often have you had to urinate less than every two hours? Less than 1 in 5 times     How often have you found you stopped and started again several times when you urinated? Less than 1 in 5 times     How often have you found it difficult to postpone urination? Not at All     How often have you had a weak urinary stream? Less than 1 in 5 times     How often have you had to strain to start urination? Not at All     How many times did you typically get up at night to urinate? 1 Time     Total IPSS Score 4           Quality of Life due to urinary symptoms   If you were to spend the rest of your life with your urinary condition just the way it is now how would you feel about that?  Mostly Satisfied              Score:  1-7 Mild 8-19 Moderate 20-35 Severe    PMH: Past Medical History:  Diagnosis Date   Acid reflux    Allergy    BPH (benign prostatic hypertrophy)    Candida infection    Dyspnea    ED (erectile dysfunction)    Genital herpes    History of continuous positive airway pressure (CPAP) therapy    HTN (hypertension)    Hydronephrosis    Hyperlipidemia    Kidney stone on left side    LVH (left ventricular hypertrophy)    Obesity    Palpitation    Holter in May 2014 with NSR, PVC, rare PACs   Palpitation    Sleep apnea    Testosterone deficiency     Surgical History: Past Surgical History:  Procedure Laterality Date   CIRCUMCISION     NASAL SEPTUM SURGERY      Home Medications:  Allergies as of 12/11/2020       Reactions   Niacin And Related Hives   Penicillins  rash   Rosuvastatin    REACTION: myalgia        Medication List        Accurate as of December 11, 2020  3:37 PM. If you have any questions, ask your nurse or doctor.          benazepril 5 MG tablet Commonly known as: LOTENSIN Take 1 tablet (5 mg total) by mouth daily.   cetirizine 10 MG tablet Commonly known as: ZYRTEC Take 10 mg by mouth daily.   colchicine 0.6 MG tablet TAKE ONE TABLET BY MOUTH DAILY AS NEEDED FOR GOUT.  Okay to fill with mitigare or colcrys also.   escitalopram 10 MG tablet Commonly known as: LEXAPRO Take 1 tablet (10 mg total) by mouth daily.   ezetimibe-simvastatin 10-20 MG tablet Commonly known as: VYTORIN Take 1 tablet by mouth daily.   fluticasone 50 MCG/ACT nasal spray Commonly known as: FLONASE Place 2 sprays into both nostrils daily.   omeprazole 20 MG capsule Commonly known as: PRILOSEC Take 20 mg by mouth daily.   valACYclovir 500 MG tablet Commonly known as: VALTREX Take 1 tablet (500 mg total) by mouth daily.        Allergies:  Allergies  Allergen Reactions   Niacin And Related Hives    Penicillins     rash   Rosuvastatin     REACTION: myalgia    Family History: Family History  Problem Relation Age of Onset   Alcohol abuse Mother    Hyperlipidemia Mother    Hypertension Mother    Hypertension Father    Hyperlipidemia Father    Colon cancer Paternal Uncle    Prostate cancer Neg Hx    Kidney disease Neg Hx    Allergic rhinitis Neg Hx    Asthma Neg Hx    Kidney cancer Neg Hx    Bladder Cancer Neg Hx     Social History:  reports that he has never smoked. He has never used smokeless tobacco. He reports current alcohol use. He reports that he does not use drugs.   Physical Exam: BP 131/90   Pulse 84   Ht '5\' 9"'$  (1.753 m)   Wt 225 lb (102.1 kg)   BMI 33.23 kg/m   Constitutional:  Alert and oriented, No acute distress. HEENT: [place] AT, moist mucus membranes.  Trachea midline, no masses. Cardiovascular: No clubbing, cyanosis, or edema. Respiratory: Normal respiratory effort, no increased work of breathing. Rectal: Normal sphincter tone,  50  CC prostate, smooth no nodules, non-tender, rubbery lateral lobes  Skin: No rashes, bruises or suspicious lesions. Neurologic: Grossly intact, no focal deficits, moving all 4 extremities. Psychiatric: Normal mood and affect.  Laboratory Data:  Lab Results  Component Value Date   CREATININE 1.06 11/13/2020    Lab Results  Component Value Date   PSA 0.59 11/13/2020   PSA 0.15 08/10/2017   PSA 0.19 08/08/2016      Pertinent Imaging: Results for orders placed or performed in visit on 12/11/20  BLADDER SCAN AMB NON-IMAGING  Result Value Ref Range   Scan Result 77m      Assessment & Plan:    BPH w/ pelvic floor dysfunction  - IPSS score 4 today  - PVR 734m- Improving w/ exercise  - asymptomatic not on any current BPH medications  - Continue pelvic floor exercises   2. Prostate cancer screening Up-to-date, rectal exam as well as PSA today is unremarkable Recommend continued annual PSA testing until at  least age  70 or life expectancy less than 10 years  Follow-up as needed   [facility] [address] [place], [place] [address] [phone]

## 2020-12-11 ENCOUNTER — Other Ambulatory Visit: Payer: Self-pay

## 2020-12-11 ENCOUNTER — Encounter: Payer: Self-pay | Admitting: Urology

## 2020-12-11 ENCOUNTER — Ambulatory Visit: Payer: BC Managed Care – PPO | Admitting: Urology

## 2020-12-11 VITALS — BP 131/90 | HR 84 | Ht 69.0 in | Wt 225.0 lb

## 2020-12-11 DIAGNOSIS — N138 Other obstructive and reflux uropathy: Secondary | ICD-10-CM

## 2020-12-11 DIAGNOSIS — N401 Enlarged prostate with lower urinary tract symptoms: Secondary | ICD-10-CM | POA: Diagnosis not present

## 2020-12-11 LAB — BLADDER SCAN AMB NON-IMAGING

## 2020-12-26 DIAGNOSIS — H9313 Tinnitus, bilateral: Secondary | ICD-10-CM | POA: Insufficient documentation

## 2021-01-02 ENCOUNTER — Telehealth: Payer: Self-pay

## 2021-01-02 NOTE — Telephone Encounter (Signed)
Called and spoke to patient about upcoming appointment, patient currently does use a CPAP and had his sleep study 10+ years ago, patient does need a new CPAP machine. Patient believes to have had his sleep study at [facility] sleep center. Sleep study printed 2020.

## 2021-01-03 ENCOUNTER — Encounter: Payer: Self-pay | Admitting: Pulmonary Disease

## 2021-01-03 ENCOUNTER — Ambulatory Visit (INDEPENDENT_AMBULATORY_CARE_PROVIDER_SITE_OTHER): Payer: BC Managed Care – PPO | Admitting: Pulmonary Disease

## 2021-01-03 ENCOUNTER — Other Ambulatory Visit: Payer: Self-pay

## 2021-01-03 VITALS — BP 118/74 | HR 82 | Temp 97.9°F | Ht 69.0 in | Wt 231.2 lb

## 2021-01-03 DIAGNOSIS — G4733 Obstructive sleep apnea (adult) (pediatric): Secondary | ICD-10-CM

## 2021-01-03 NOTE — Patient Instructions (Signed)
History of obstructive sleep apnea  We will schedule you for home sleep study  Continue using your current CPAP  Call with significant concerns  Sleep Apnea Sleep apnea affects breathing during sleep. It causes breathing to stop for 10 seconds or more, or to become shallow. People with sleep apnea usually snore loudly. It can also increase the risk of: Heart attack. Stroke. Being very overweight (obese). Diabetes. Heart failure. Irregular heartbeat. High blood pressure. The goal of treatment is to help you breathe normally again. What are the causes? The most common cause of this condition is a collapsed or blocked airway. There are three kinds of sleep apnea: Obstructive sleep apnea. This is caused by a blocked or collapsed airway. Central sleep apnea. This happens when the brain does not send the right signals to the muscles that control breathing. Mixed sleep apnea. This is a combination of obstructive and central sleep apnea. What increases the risk? Being overweight. Smoking. Having a small airway. Being older. Being male. Drinking alcohol. Taking medicines to calm yourself (sedatives or tranquilizers). Having family members with the condition. Having a tongue or tonsils that are larger than normal. What are the signs or symptoms? Trouble staying asleep. Loud snoring. Headaches in the morning. Waking up gasping. Dry mouth or sore throat in the morning. Being sleepy or tired during the day. If you are sleepy or tired during the day, you may also: Not be able to focus your mind (concentrate). Forget things. Get angry a lot and have mood swings. Feel sad (depressed). Have changes in your personality. Have less interest in sex, if you are male. Be unable to have an erection, if you are male. How is this treated?  Sleeping on your side. Using a medicine to get rid of mucus in your nose (decongestant). Avoiding the use of alcohol, medicines to help you relax, or  certain pain medicines (narcotics). Losing weight, if needed. Changing your diet. Quitting smoking. Using a machine to open your airway while you sleep, such as: An oral appliance. This is a mouthpiece that shifts your lower jaw forward. A CPAP device. This device blows air through a mask when you breathe out (exhale). An EPAP device. This has valves that you put in each nostril. A BPAP device. This device blows air through a mask when you breathe in (inhale) and breathe out. Having surgery if other treatments do not work. Follow these instructions at home: Lifestyle Make changes that your doctor recommends. Eat a healthy diet. Lose weight if needed. Avoid alcohol, medicines to help you relax, and some pain medicines. Do not smoke or use any products that contain nicotine or tobacco. If you need help quitting, ask your doctor. General instructions Take over-the-counter and prescription medicines only as told by your doctor. If you were given a machine to use while you sleep, use it only as told by your doctor. If you are having surgery, make sure to tell your doctor you have sleep apnea. You may need to bring your device with you. Keep all follow-up visits. Contact a doctor if: The machine that you were given to use during sleep bothers you or does not seem to be working. You do not get better. You get worse. Get help right away if: Your chest hurts. You have trouble breathing in enough air. You have an uncomfortable feeling in your back, arms, or stomach. You have trouble talking. One side of your body feels weak. A part of your face is hanging down. These  symptoms may be an emergency. Get help right away. Call your local emergency services (911 in the [place] Do not wait to see if the symptoms will go away. Do not drive yourself to the hospital. Summary This condition affects breathing during sleep. The most common cause is a collapsed or blocked airway. The goal of treatment  is to help you breathe normally while you sleep. This information is not intended to replace advice given to you by your health care provider. Make sure you discuss any questions you have with your health care provider. Document Revised: 03/23/2020 Document Reviewed: 03/23/2020 Elsevier Patient Education  2022 [employer].

## 2021-01-03 NOTE — Progress Notes (Signed)
Joseph Drake    [id number]    August 04, 1960  Primary Care Physician:Duncan, Elveria Rising, MD  Referring Physician: Tonia Ghent, MD [address] [place],  [place] [address]  Chief complaint:  Patient is being seen for history of obstructive sleep apnea With CPAP on a regular basis  HPI:  Does have some daytime sleepiness Machine not as effective as what it was before  He still notices that he does snore, dry mouth whenever he travels for may be a day without the CPAP  Diagnosed in 2011 Was very mild about 2030 pound weight gain since then Uses the machine nightly  Usually goes to bed between 10 and 11 PM Takes about 5 minutes to fall asleep 1 awakening Final wake up time about 5:30 AM on weekdays  Is very active  History of hypertension, hypercholesterolemia Never smoker  Wakes up feeling restored in the morning, takes him a couple hours to get himself going  He started using CPAP at pressure of 7, at some point pressure increased of 8   Outpatient Encounter Medications as of 01/03/2021  Medication Sig   benazepril (LOTENSIN) 5 MG tablet Take 1 tablet (5 mg total) by mouth daily.   cetirizine (ZYRTEC) 10 MG tablet Take 10 mg by mouth daily.   colchicine 0.6 MG tablet TAKE ONE TABLET BY MOUTH DAILY AS NEEDED FOR GOUT.  Okay to fill with mitigare or colcrys also.   escitalopram (LEXAPRO) 10 MG tablet Take 1 tablet (10 mg total) by mouth daily.   fluticasone (FLONASE) 50 MCG/ACT nasal spray Place 2 sprays into both nostrils daily.   omeprazole (PRILOSEC) 20 MG capsule Take 20 mg by mouth daily.   valACYclovir (VALTREX) 500 MG tablet Take 1 tablet (500 mg total) by mouth daily.   ezetimibe-simvastatin (VYTORIN) 10-20 MG tablet Take 1 tablet by mouth daily. (Patient not taking: Reported on 01/03/2021)   No facility-administered encounter medications on file as of 01/03/2021.    Allergies as of 01/03/2021 - Review Complete 01/03/2021  Allergen Reaction  Noted   Niacin and related Hives 07/15/2014   Penicillins  03/17/2007   Rosuvastatin  03/17/2007    Past Medical History:  Diagnosis Date   Acid reflux    Allergy    BPH (benign prostatic hypertrophy)    Candida infection    Dyspnea    ED (erectile dysfunction)    Genital herpes    History of continuous positive airway pressure (CPAP) therapy    HTN (hypertension)    Hydronephrosis    Hyperlipidemia    Kidney stone on left side    LVH (left ventricular hypertrophy)    Obesity    Palpitation    Holter in May 2014 with NSR, PVC, rare PACs   Palpitation    Sleep apnea    Testosterone deficiency     Past Surgical History:  Procedure Laterality Date   CIRCUMCISION     NASAL SEPTUM SURGERY      Family History  Problem Relation Age of Onset   Alcohol abuse Mother    Hyperlipidemia Mother    Hypertension Mother    Hypertension Father    Hyperlipidemia Father    Colon cancer Paternal Uncle    Prostate cancer Neg Hx    Kidney disease Neg Hx    Allergic rhinitis Neg Hx    Asthma Neg Hx    Kidney cancer Neg Hx    Bladder Cancer Neg Hx  Social History   Socioeconomic History   Marital status: Married    Spouse name: Not on file   Number of children: Not on file   Years of education: Not on file   Highest education level: Not on file  Occupational History   Not on file  Tobacco Use   Smoking status: Never   Smokeless tobacco: Never  Vaping Use   Vaping Use: Never used  Substance and Sexual Activity   Alcohol use: Yes    Alcohol/week: 0.0 standard drinks    Comment: rarely    Drug use: No   Sexual activity: Yes  Other Topics Concern   Not on file  Social History Narrative   Drafting, structural Chief Strategy Officer for bridges   Remarried 2002   1 daughter from 1st marriage   2 stepkids from wife's first marriage   Tourist information centre manager   Social Determinants of Health   Financial Resource Strain: Not on file  Food Insecurity: Not on file  Transportation Needs: Not  on file  Physical Activity: Not on file  Stress: Not on file  Social Connections: Not on file  Intimate Partner Violence: Not on file    Review of Systems  Constitutional:  Negative for fatigue.  Respiratory:  Positive for apnea.   Psychiatric/Behavioral:  Positive for sleep disturbance.    Vitals:   01/03/21 0949  BP: 118/74  Pulse: 82  Temp: 97.9 F (36.6 C)  SpO2: 96%     Physical Exam Constitutional:      Appearance: He is obese.  HENT:     Head: Normocephalic.     Nose: No congestion.     Mouth/Throat:     Mouth: Mucous membranes are moist.     Comments: Mallampati 3, crowded oropharynx Eyes:     Pupils: Pupils are equal, round, and reactive to light.  Cardiovascular:     Rate and Rhythm: Normal rate and regular rhythm.     Heart sounds: No murmur heard.   No friction rub.  Pulmonary:     Effort: No respiratory distress.     Breath sounds: No stridor. No wheezing or rhonchi.  Musculoskeletal:     Cervical back: No rigidity or tenderness.  Neurological:     Mental Status: He is alert.  Psychiatric:        Mood and Affect: Mood normal.   Epworth Sleepiness Scale of 5  Data Reviewed: Previous sleep study reviewed showing very mild obstructive sleep apnea  Assessment:  Obstructive sleep apnea -Excellent compliance with CPAP -Experiencing daytime sleepiness  Obesity  Symptoms not as optimally controlled as previously  Plan/Recommendations: We will schedule for home sleep study to confirm presence of sleep disordered breathing  Auto titrating CPAP therapy may be appropriate  Pathophysiology of sleep disordered breathing discussed with the patient Treatment options discussed with the patient  Tentative follow-up in 3 months  Weight loss efforts encouraged   Sherrilyn Rist MD [facility] 01/03/2021, 10:14 AM  CC: Tonia Ghent, MD

## 2021-02-01 DIAGNOSIS — H903 Sensorineural hearing loss, bilateral: Secondary | ICD-10-CM | POA: Diagnosis not present

## 2021-03-18 ENCOUNTER — Other Ambulatory Visit: Payer: Self-pay

## 2021-03-18 ENCOUNTER — Ambulatory Visit: Payer: BC Managed Care – PPO

## 2021-03-18 DIAGNOSIS — G4733 Obstructive sleep apnea (adult) (pediatric): Secondary | ICD-10-CM

## 2021-03-19 ENCOUNTER — Ambulatory Visit: Payer: BC Managed Care – PPO | Admitting: Family Medicine

## 2021-03-19 ENCOUNTER — Encounter: Payer: Self-pay | Admitting: Family Medicine

## 2021-03-19 VITALS — BP 110/80 | HR 71 | Temp 97.9°F | Ht 69.0 in | Wt 226.0 lb

## 2021-03-19 DIAGNOSIS — R21 Rash and other nonspecific skin eruption: Secondary | ICD-10-CM | POA: Diagnosis not present

## 2021-03-19 DIAGNOSIS — E785 Hyperlipidemia, unspecified: Secondary | ICD-10-CM

## 2021-03-19 MED ORDER — EZETIMIBE 10 MG PO TABS: 10.0000 mg | ORAL_TABLET | Freq: Every day | ORAL | 3 refills | Status: DC

## 2021-03-19 MED ORDER — FLUCONAZOLE 150 MG PO TABS: 150.0000 mg | ORAL_TABLET | ORAL | 0 refills | Status: DC

## 2021-03-19 NOTE — Progress Notes (Signed)
This visit occurred during the SARS-CoV-2 public health emergency.  Safety protocols were in place, including screening questions prior to the visit, additional usage of staff PPE, and extensive cleaning of exam room while observing appropriate contact time as indicated for disinfecting solutions.  Rash.  Tried otc meds w/o relief.  Going on for "a while".  Itchy and burning.  No fevers.  B inguinal area.    He stopped vytorin and leg aches got better.  Off med currently.  D/w pt about options.  Likely that simvastatin was the issue, d/w pt.  He prev tolerated zetia by itself.    The 10-year ASCVD risk score (Arnett DK, et al., 2019) is: 8.5%   Values used to calculate the score:     Age: 60 years     Sex: Male     Is Non-Hispanic African American: No     Diabetic: No     Tobacco smoker: No     Systolic Blood Pressure: 469 mmHg     Is BP treated: Yes     HDL Cholesterol: 31.3 mg/dL     Total Cholesterol: 139 mg/dL  He has atherosclerotic calcifications of the abdominal aorta and branch vessels. D/w pt about CT heart re: vascular eval.  No CP.  Not SOB.  See orders.    Meds, vitals, and allergies reviewed.   ROS: Per HPI unless specifically indicated in ROS section   Nad Ncat Neck supple, no LA, no bruit B Rrr Ctab B mild tinea cruris changes.    30 minutes were devoted to patient care in this encounter (this includes time spent reviewing the patient's file/history, interviewing and examining the patient, counseling/reviewing plan with patient).

## 2021-03-19 NOTE — Patient Instructions (Addendum)
Try zetia and let me know if you have aches on that.    You should get a call about a heart CT scan.    Take diflucan weekly but don't take with colchicine.  Update me as needed.   Take care.  Glad to see you.

## 2021-03-24 NOTE — Assessment & Plan Note (Signed)
Discussed options.  Looks like mild tinea changes.  Reasonable to take diflucan weekly but I advised him not to take it with colchicine.  He can update me as needed.

## 2021-03-24 NOTE — Assessment & Plan Note (Signed)
Discussed options.  Reasonable to try zetia and he can let me know if he is having aches on that.   He has atherosclerotic calcifications previously seen on abdominal aorta and we talked about getting cardiac CT to further evaluate.  That may change the plan going forward in terms of more aggressive lipid management.

## 2021-04-06 ENCOUNTER — Telehealth: Payer: Self-pay | Admitting: Pulmonary Disease

## 2021-04-06 DIAGNOSIS — G4733 Obstructive sleep apnea (adult) (pediatric): Secondary | ICD-10-CM | POA: Diagnosis not present

## 2021-04-06 NOTE — Telephone Encounter (Signed)
Call patient  Sleep study result  Date of study: 03/18/2021   Impression: Moderate obstructive sleep apnea Mild oxygen desaturations  Recommendation:  DME referral  Recommend CPAP therapy for moderate obstructive sleep apnea  Auto titrating CPAP with pressure settings of 5-15 will be appropriate  Encourage weight loss measures  Follow-up in the office 4 to 6 weeks following initiation of treatment

## 2021-04-08 NOTE — Telephone Encounter (Signed)
Attempted to call patient and was not able to leave a message.

## 2021-04-11 ENCOUNTER — Other Ambulatory Visit: Payer: Self-pay

## 2021-04-11 ENCOUNTER — Ambulatory Visit (INDEPENDENT_AMBULATORY_CARE_PROVIDER_SITE_OTHER)
Admission: RE | Admit: 2021-04-11 | Discharge: 2021-04-11 | Disposition: A | Discharge status: Home / Self Care | Payer: Self-pay | Source: Ambulatory Visit | Attending: Family Medicine | Admitting: Family Medicine

## 2021-04-11 DIAGNOSIS — E785 Hyperlipidemia, unspecified: Secondary | ICD-10-CM

## 2021-04-11 IMAGING — CT CT CARDIAC CORONARY ARTERY CALCIUM SCORE
3 series · 14 of 20 positions shown, 16 images · non-contrast
Comparison: None.
COMPARISON: None.

Addendum:
EXAM:
OVER-READ INTERPRETATION  CT CHEST

The following report is an over-read performed by radiologist Dr.
Soriano Matsumoto [REDACTED] on 04/11/2021. This
over-read does not include interpretation of cardiac or coronary
anatomy or pathology. The coronary calcium score/coronary CTA
interpretation by the cardiologist is attached.
CLINICAL DATA: Risk stratification: 60 Year-old White Male
Coronary Calcium Score
TECHNIQUE: The patient was scanned on a Siemens Force scanner. Axial
non-contrast 3 mm slices were carried out through the heart. The
data set was analyzed on a dedicated work station and scored using
the Agatson method.

[Series 2: cascseq 2.0 sa36 (id) (id) · axial · 0.42mm/px · z∈[-249,-159]mm · 4 of 76 slices shown]
[im 16/76  vessel]
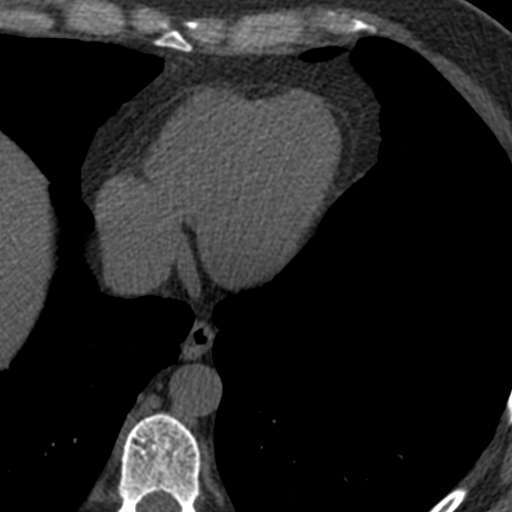
[im 31/76  vessel]
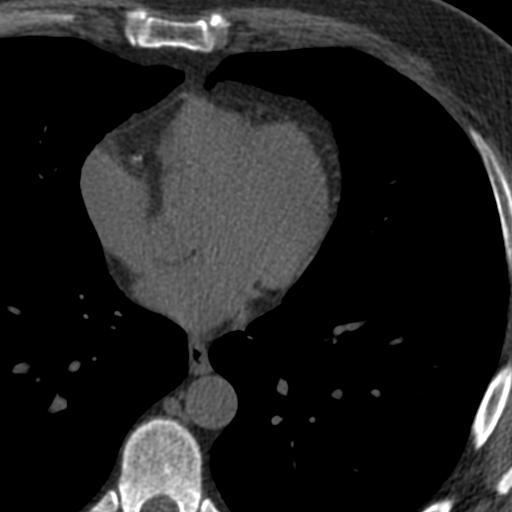
[im 46/76  vessel]
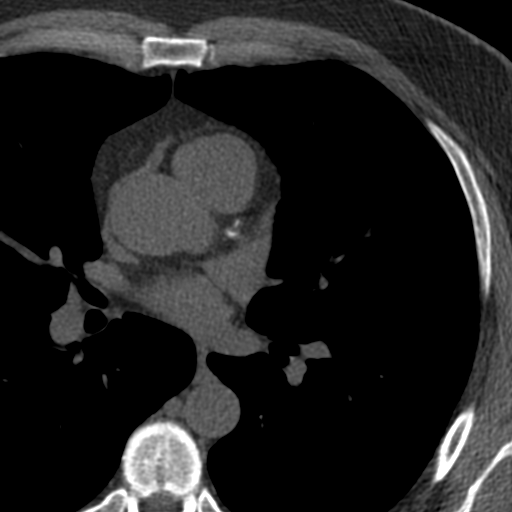
[im 61/76  vessel]
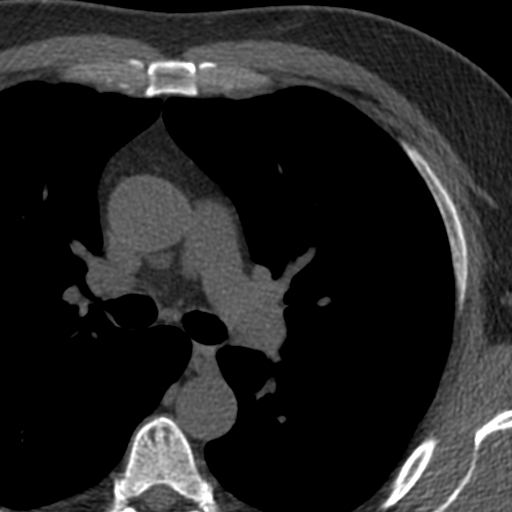

[Series 3: cascseq 2.0 bf37 st · axial · 0.78mm/px · z∈[-255,-155]mm · 5 of 76 slices shown, 7 images]
[im 13/76  vessel]
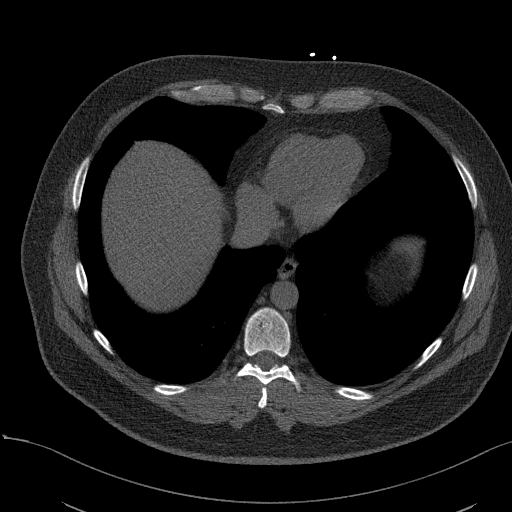
[im 13/76  lung]
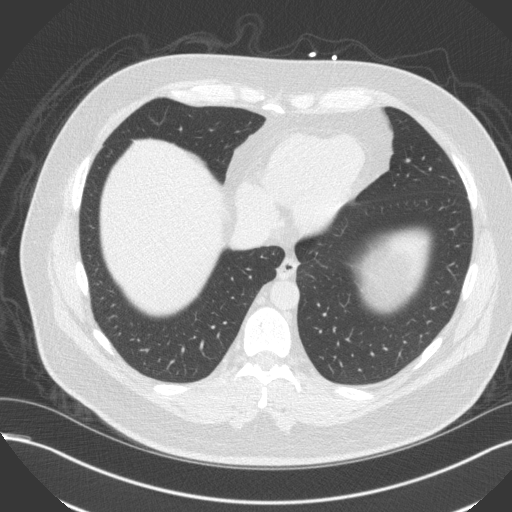
[im 26/76  vessel]
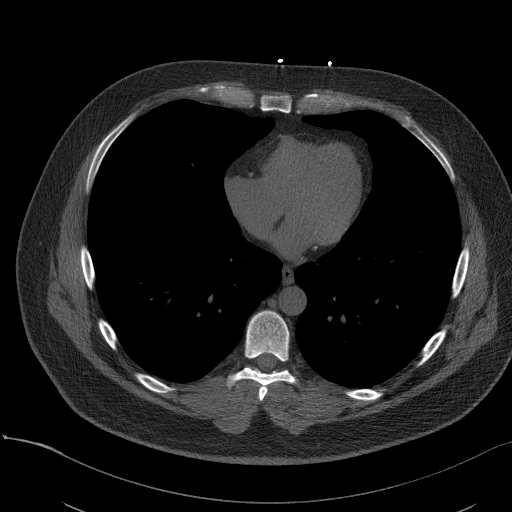
[im 38/76  vessel]
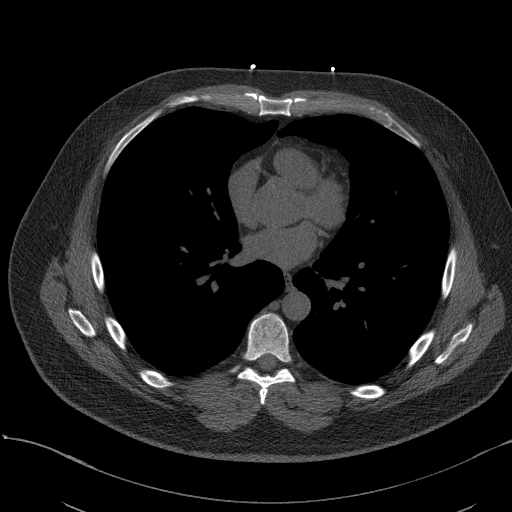
[im 51/76  vessel]
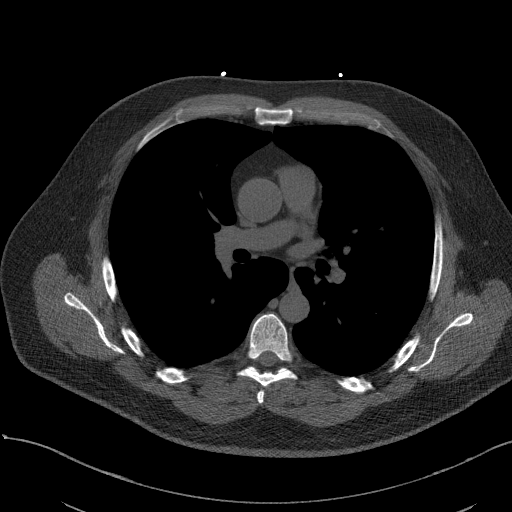
[im 63/76  vessel]
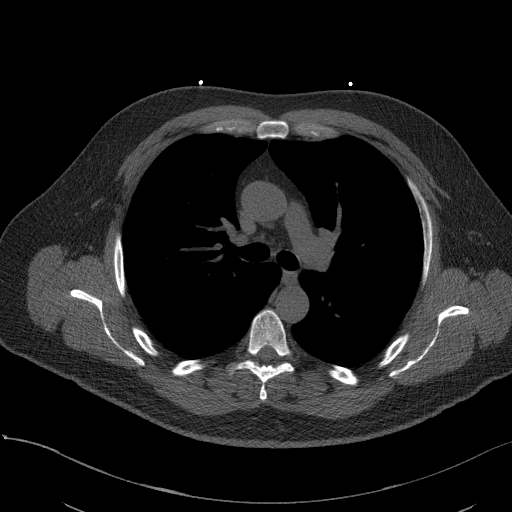
[im 63/76  lung]
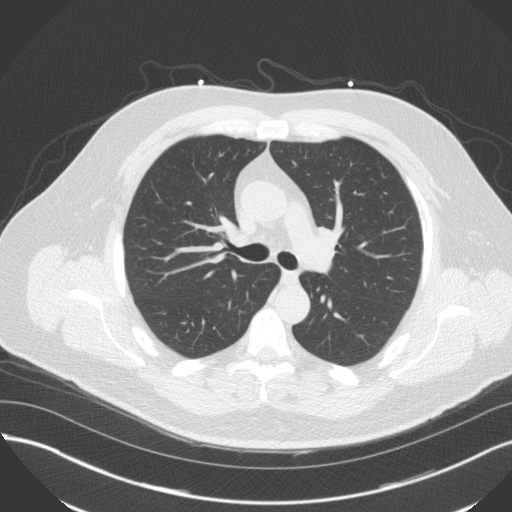

[Series 4: cascseq 2.0 br59 lung · axial · 0.80mm/px · z∈[-255,-155]mm · 5 of 76 slices shown]
[im 13/76  lung]
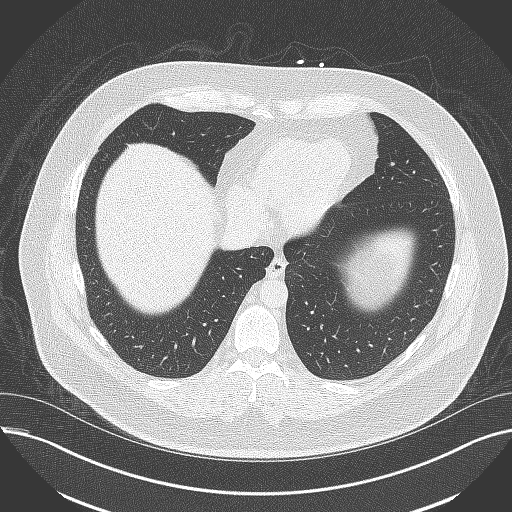
[im 26/76  lung]
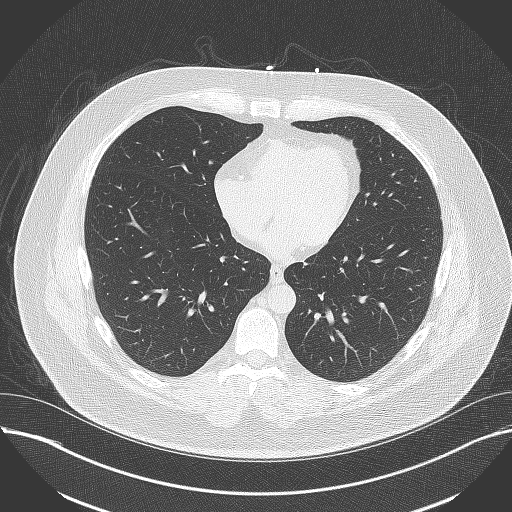
[im 38/76  lung]
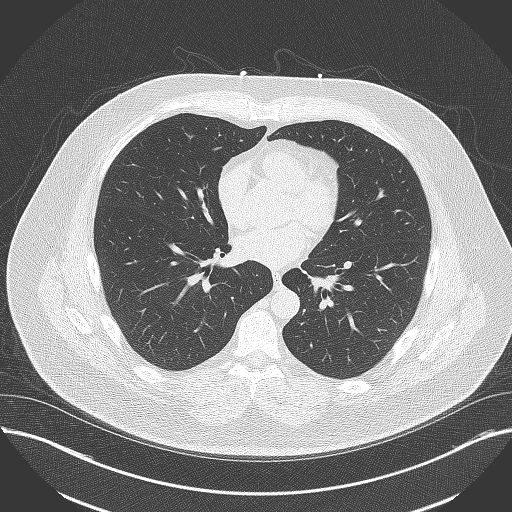
[im 51/76  lung]
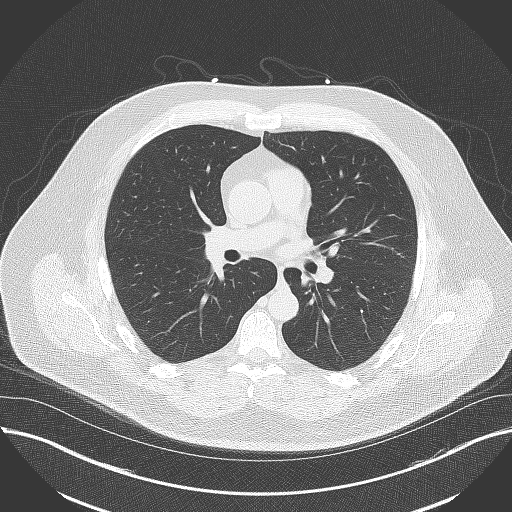
[im 63/76  lung]
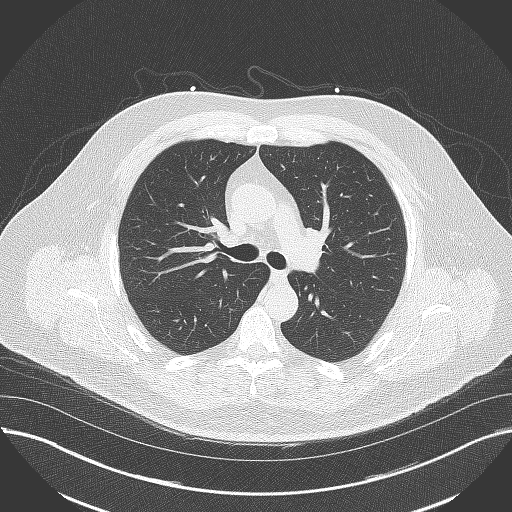

[14 of 20 positions shown; findings below may reference images not displayed]

FINDINGS: Within the visualized portions of the thorax there are no suspicious
appearing pulmonary nodules or masses, there is no acute
consolidative airspace disease, no pleural effusions, no
pneumothorax and no lymphadenopathy. Visualized portions of the
upper abdomen are unremarkable. There are no aggressive appearing
lytic or blastic lesions noted in the visualized portions of the
skeleton.
IMPRESSION: 1. No significant incidental noncardiac findings are noted.
FINDINGS: Non-cardiac: See separate report from [REDACTED].

Ascending Aorta: Normal caliber.  Aortic atherosclerosis.

Pericardium: Normal.

Coronary arteries: Normal origins.

Coronary Calcium Score:

Left main: 0

Left anterior descending artery: 7

Left circumflex artery: 36

Right coronary artery: 0

Total: 43

Percentile: 61st for age, sex, and race matched control.
IMPRESSION: 1. Coronary calcium score of 43. This was 63rd percentile for age,
gender, and race matched controls.

Aortic atherosclerosis.

RECOMMENDATIONS:



If CAC = 0, it is reasonable to withhold statin therapy and reassess
in 5 to 10 years, as long as higher risk conditions are absent
(diabetes mellitus, family history of premature CHD in first degree
relatives (males <55 years; females <65 years), cigarette smoking,
LDL >=190 mg/dL or other independent risk factors).

If CAC is 1 to 99, it is reasonable to initiate statin therapy for
patients >=55 years of age.

If CAC is >=100 or >=75th percentile, it is reasonable to initiate
statin therapy at any age.

Cardiology referral should be considered for patients with CAC
scores =400 or >=75th percentile.

*1958 AHA/ACC/AACVPR/AAPA/ABC/LOOI/SAPARILLA/NYA/Jabre/TIGER/BENILTON/KJIRE
Guideline on the Management of Blood Cholesterol: A Report of the
American College of Cardiology/American Heart Association Task Force
on Clinical Practice Guidelines. J Am Coll Cardiol.
0770;73(24):0709-0641.

*** End of Addendum ***
EXAM:
OVER-READ INTERPRETATION  CT CHEST

The following report is an over-read performed by radiologist Dr.
Soriano Matsumoto [REDACTED] on 04/11/2021. This
over-read does not include interpretation of cardiac or coronary
anatomy or pathology. The coronary calcium score/coronary CTA
interpretation by the cardiologist is attached.
FINDINGS: Within the visualized portions of the thorax there are no suspicious
appearing pulmonary nodules or masses, there is no acute
consolidative airspace disease, no pleural effusions, no
pneumothorax and no lymphadenopathy. Visualized portions of the
upper abdomen are unremarkable. There are no aggressive appearing
lytic or blastic lesions noted in the visualized portions of the
skeleton.
IMPRESSION: 1. No significant incidental noncardiac findings are noted.

## 2021-04-12 NOTE — Telephone Encounter (Signed)
Called and spoke with patient. He verbalized understanding of results. He stated that he currently has as cpap machine but is unsure of how much longer it will last. It has been displaying a message of "motor life exceeded". I explained to him that it would be best to place an order for a new cpap machine now before the machine stops working altogether. He agreed.   When asked if he had a DME he wanted to use, he mentioned [employer] since they have an office located close to his job. Advised him that I would go ahead and place the order. He verbalized understanding.   Order has been placed.   Nothing further needed at time of call.

## 2021-04-12 NOTE — Telephone Encounter (Signed)
I have left a message for the patient to call back for his results.

## 2021-04-12 NOTE — Telephone Encounter (Signed)
Patient is returning phone call. Patient phone number is [phone].

## 2021-04-16 ENCOUNTER — Other Ambulatory Visit: Payer: Self-pay | Admitting: Family Medicine

## 2021-04-16 DIAGNOSIS — E785 Hyperlipidemia, unspecified: Secondary | ICD-10-CM

## 2021-05-06 DIAGNOSIS — H903 Sensorineural hearing loss, bilateral: Secondary | ICD-10-CM | POA: Diagnosis not present

## 2021-05-14 DIAGNOSIS — G4733 Obstructive sleep apnea (adult) (pediatric): Secondary | ICD-10-CM | POA: Diagnosis not present

## 2021-06-03 ENCOUNTER — Ambulatory Visit: Payer: BC Managed Care – PPO | Admitting: Podiatry

## 2021-06-03 ENCOUNTER — Ambulatory Visit (INDEPENDENT_AMBULATORY_CARE_PROVIDER_SITE_OTHER): Payer: BC Managed Care – PPO

## 2021-06-03 ENCOUNTER — Other Ambulatory Visit: Payer: Self-pay

## 2021-06-03 ENCOUNTER — Encounter: Payer: Self-pay | Admitting: Family Medicine

## 2021-06-03 DIAGNOSIS — M779 Enthesopathy, unspecified: Secondary | ICD-10-CM

## 2021-06-03 DIAGNOSIS — M778 Other enthesopathies, not elsewhere classified: Secondary | ICD-10-CM

## 2021-06-03 MED ORDER — TRIAMCINOLONE ACETONIDE 10 MG/ML IJ SUSP
20.0000 mg | Freq: Once | INTRAMUSCULAR | Status: AC
Start: 2021-06-03 — End: 2021-06-03
  Administered 2021-06-03: 20 mg

## 2021-06-03 NOTE — Progress Notes (Signed)
SITUATION Reason for Consult: Evaluation for Bilateral Custom Foot Orthoses Patient / Caregiver Report: Patient is ready for foot orthotics  OBJECTIVE DATA: Patient History / Diagnosis:    ICD-10-CM   1. Capsulitis  M77.9     2. Extensor tendinitis of foot  M77.8       Current or Previous Devices: History of custom foot orthotic use  Foot Examination: Skin presentation:   Intact Ulcers & Callousing:   None and no history Toe / Foot Deformities:  Pes planus Weight Bearing Presentation:  planus Sensation:    Intact  Shoe Size:    10.20M  ORTHOTIC RECOMMENDATION Recommended Device: 1x pair of custom functional foot orthotics  GOALS OF ORTHOSES - Reduce Pain - Prevent Foot Deformity - Prevent Progression of Further Foot Deformity - Relieve Pressure - Improve the Overall Biomechanical Function of the Foot and Lower Extremity.  ACTIONS PERFORMED Patient was casted for Foot Orthoses via crush box. Procedure was explained and patient tolerated procedure well. All questions were answered and concerns addressed.  PLAN Potential out of pocket cost was communicated to patient. Casts are to be sent to [facility] for fabrication. Patient is to be called for fitting when devices are ready.

## 2021-06-04 NOTE — Progress Notes (Signed)
Subjective:   Patient ID: Joseph Drake, male   DOB: 61 y.o.   MRN: [id number]   HPI Patient presents stating he has had chronic pain around his midfoot region bilateral and had orthotics from a number of years ago which have worn out and states that he knows he has flatfeet.  States has had previous injections the last one being around a year ago which are effective at helping to reduce the discomfort that the patient experiences.  Patient does not smoke likes to be active   Review of Systems  All other systems reviewed and are negative.      Objective:  Physical Exam  Neurovascular status found to be intact muscle strength found to be adequate range of motion adequate with the patient noted to have discomfort around the lesser metatarsal cuneiform joint bilateral with most the discomfort around the first metatarsocuneiform joint bilateral with moderate enlargement and spur formation.  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  At this point sterile prep and injected around the first metatarsal cuneiform joint 3 mg dexamethasone Kenalog 5 mg Xylocaine bilateral with sterile dressings applied and then went ahead and discussed long-term orthotics and pedorthist casted for functional orthotic devices to lift up the arch and take pressure off the feet.  Patient tolerated this well will be seen back to recheck  X-rays indicate that there is spurring around the first metatarsal cuneiform joint bilateral with mild indications of long-term arthritis

## 2021-06-09 ENCOUNTER — Other Ambulatory Visit: Payer: Self-pay | Admitting: Family Medicine

## 2021-06-09 NOTE — Telephone Encounter (Signed)
Please see mychart message from patient.  FYI regarding stopping Zetia.  I appreciate your help and will await your input.  Take care.

## 2021-06-14 DIAGNOSIS — G4733 Obstructive sleep apnea (adult) (pediatric): Secondary | ICD-10-CM | POA: Diagnosis not present

## 2021-07-11 DIAGNOSIS — L814 Other melanin hyperpigmentation: Secondary | ICD-10-CM | POA: Diagnosis not present

## 2021-07-11 DIAGNOSIS — L821 Other seborrheic keratosis: Secondary | ICD-10-CM | POA: Diagnosis not present

## 2021-07-11 DIAGNOSIS — L918 Other hypertrophic disorders of the skin: Secondary | ICD-10-CM | POA: Diagnosis not present

## 2021-07-11 DIAGNOSIS — L570 Actinic keratosis: Secondary | ICD-10-CM | POA: Diagnosis not present

## 2021-07-11 DIAGNOSIS — D492 Neoplasm of unspecified behavior of bone, soft tissue, and skin: Secondary | ICD-10-CM | POA: Diagnosis not present

## 2021-07-11 DIAGNOSIS — D225 Melanocytic nevi of trunk: Secondary | ICD-10-CM | POA: Diagnosis not present

## 2021-07-12 DIAGNOSIS — G4733 Obstructive sleep apnea (adult) (pediatric): Secondary | ICD-10-CM | POA: Diagnosis not present

## 2021-07-16 ENCOUNTER — Other Ambulatory Visit: Payer: BC Managed Care – PPO

## 2021-07-18 ENCOUNTER — Ambulatory Visit: Payer: BC Managed Care – PPO

## 2021-07-18 ENCOUNTER — Other Ambulatory Visit: Payer: Self-pay

## 2021-07-18 DIAGNOSIS — M779 Enthesopathy, unspecified: Secondary | ICD-10-CM

## 2021-07-18 NOTE — Progress Notes (Signed)
SITUATION: ?Reason for Visit: Fitting and Delivery of Custom Fabricated Foot Orthoses ?Patient Report: Patient reports comfort and is satisfied with device. ? ?OBJECTIVE DATA: ?Patient History / Diagnosis:   ?  ICD-10-CM   ?1. Capsulitis  M77.9   ?  ? ? ?Provided Device:  Custom Functional Foot Orthotics ?    [facility]: [id number] ? ?GOAL OF ORTHOSIS ?- Improve gait ?- Decrease energy expenditure ?- Improve Balance ?- Provide Triplanar stability of foot complex ?- Facilitate motion ? ?ACTIONS PERFORMED ?Patient was fit with foot orthotics trimmed to shoe last. Patient tolerated fittign procedure.  ? ?Patient was provided with verbal and written instruction and demonstration regarding donning, doffing, wear, care, proper fit, function, purpose, cleaning, and use of the orthosis and in all related precautions and risks and benefits regarding the orthosis. ? ?Patient was also provided with verbal instruction regarding how to report any failures or malfunctions of the orthosis and necessary follow up care. Patient was also instructed to contact our office regarding any change in status that may affect the function of the orthosis. ? ?Patient demonstrated independence with proper donning, doffing, and fit and verbalized understanding of all instructions. ? ?PLAN: ?Patient is to follow up in one week or as necessary (PRN). All questions were answered and concerns addressed. Plan of care was discussed with and agreed upon by the patient. ? ?

## 2021-07-29 DIAGNOSIS — G4733 Obstructive sleep apnea (adult) (pediatric): Secondary | ICD-10-CM | POA: Diagnosis not present

## 2021-07-30 ENCOUNTER — Encounter: Payer: Self-pay | Admitting: Family Medicine

## 2021-07-30 ENCOUNTER — Ambulatory Visit: Payer: BC Managed Care – PPO | Admitting: Family Medicine

## 2021-07-30 VITALS — BP 110/64 | HR 64 | Temp 97.8°F | Ht 69.0 in | Wt 215.0 lb

## 2021-07-30 DIAGNOSIS — R21 Rash and other nonspecific skin eruption: Secondary | ICD-10-CM | POA: Diagnosis not present

## 2021-07-30 DIAGNOSIS — R739 Hyperglycemia, unspecified: Secondary | ICD-10-CM | POA: Diagnosis not present

## 2021-07-30 LAB — GLUCOSE, RANDOM: Glucose, Bld: 84 mg/dL (ref 70–99)

## 2021-07-30 LAB — HEMOGLOBIN A1C: Hgb A1c MFr Bld: 5.9 % (ref 4.6–6.5)

## 2021-07-30 MED ORDER — NYSTATIN 100000 UNIT/GM EX POWD: 1.0000 "application " | Freq: Two times a day (BID) | CUTANEOUS | 0 refills | Status: DC

## 2021-07-30 MED ORDER — FLUCONAZOLE 150 MG PO TABS: 150.0000 mg | ORAL_TABLET | ORAL | 0 refills | Status: DC

## 2021-07-30 NOTE — Patient Instructions (Addendum)
Go to the lab on the way out.   If you have mychart we'll likely use that to update you.    ?Take care.  Glad to see you. ?Take diflucan weekly and use nystatin in the meantime.  ?Update me as needed.  ?Don't take colchicine with diflucan.  ?

## 2021-07-30 NOTE — Progress Notes (Signed)
Rash.  He got some better with diflucan but then had inc sx in the meantime.  Never had complete resolution.  B inguinal area.  No FCNAVD.  Itching/irritated/burning locally.  No dysuria.  No testicle pain.  Used mult OTC meds.  He has been working on diet and exercise.  He has cards f/u pending.  D/w pt about checking sugar given prev mild sugar elevation.  See labs.   ? ?Meds, vitals, and allergies reviewed.  ? ?ROS: Per HPI unless specifically indicated in ROS section  ? ?Nad ?Ncat ?Mild B inguinal rash, similar at waistline. No ulceration.  ?

## 2021-07-31 NOTE — Assessment & Plan Note (Signed)
Reasonable to check sure to exclude diabetes.  See notes on labs ?Take diflucan weekly and use nystatin in the meantime.  ?Update me as needed.  ?Advised not to take colchicine with diflucan.  He agrees to plan. ?

## 2021-08-12 DIAGNOSIS — G4733 Obstructive sleep apnea (adult) (pediatric): Secondary | ICD-10-CM | POA: Diagnosis not present

## 2021-08-19 DIAGNOSIS — H903 Sensorineural hearing loss, bilateral: Secondary | ICD-10-CM | POA: Diagnosis not present

## 2021-08-20 ENCOUNTER — Ambulatory Visit: Payer: BC Managed Care – PPO | Admitting: Podiatry

## 2021-08-20 DIAGNOSIS — M778 Other enthesopathies, not elsewhere classified: Secondary | ICD-10-CM | POA: Diagnosis not present

## 2021-08-23 ENCOUNTER — Ambulatory Visit (INDEPENDENT_AMBULATORY_CARE_PROVIDER_SITE_OTHER): Payer: BC Managed Care – PPO

## 2021-08-23 DIAGNOSIS — M779 Enthesopathy, unspecified: Secondary | ICD-10-CM

## 2021-08-23 DIAGNOSIS — M778 Other enthesopathies, not elsewhere classified: Secondary | ICD-10-CM

## 2021-08-23 NOTE — Progress Notes (Signed)
SITUATION: ?Reason for Visit: Fitting and Delivery of [place] ?Patient Report: Patient reports comfort and is satisfied with device. ? ?OBJECTIVE DATA: ?Patient History / Diagnosis:   ?  ICD-10-CM   ?1. Capsulitis  M77.9   ?  ?2. Extensor tendinitis of foot  M77.8   ?  ? ? ?Provided Device:  Custom Functional Foot Orthotics ?    [facility]: [id number] - Refurbished ? ?GOAL OF ORTHOSIS ?- Improve gait ?- Decrease energy expenditure ?- Improve Balance ?- Provide Triplanar stability of foot complex ?- Facilitate motion ? ?ACTIONS PERFORMED ?Patient was fit with foot orthotics trimmed to shoe last. Patient tolerated fittign procedure.  ? ?Patient was provided with verbal and written instruction and demonstration regarding donning, doffing, wear, care, proper fit, function, purpose, cleaning, and use of the orthosis and in all related precautions and risks and benefits regarding the orthosis. ? ?Patient was also provided with verbal instruction regarding how to report any failures or malfunctions of the orthosis and necessary follow up care. Patient was also instructed to contact our office regarding any change in status that may affect the function of the orthosis. ? ?Patient demonstrated independence with proper donning, doffing, and fit and verbalized understanding of all instructions. ? ?PLAN: ?Patient is to follow up in one week or as necessary (PRN). All questions were answered and concerns addressed. Plan of care was discussed with and agreed upon by the patient. ? ?

## 2021-08-27 ENCOUNTER — Encounter: Payer: Self-pay | Admitting: Podiatry

## 2021-08-27 NOTE — Progress Notes (Signed)
?Subjective:  ?Patient ID: Joseph Drake, male    DOB: 1960/09/03,  MRN: [id number] ? ?Chief Complaint  ?Patient presents with  ? Foot Pain  ? ? ?61 y.o. male presents with the above complaint.  Patient presents with complaint of bilateral midfoot pain.  Patient states that the injection that he was given by Dr. Paulla Dolly few months ago helped considerably.  He states that started come back again.  He wanted know if he can do another shot.  He states that it hurts when is back on his feet.  He denies any other acute complaints he has not seen anyone else prior to seeing me.  He denies any other acute issues ? ? ?Review of Systems: Negative except as noted in the HPI. Denies N/V/F/Ch. ? ?Past Medical History:  ?Diagnosis Date  ? Acid reflux   ? Allergy   ? BPH (benign prostatic hypertrophy)   ? Candida infection   ? Dyspnea   ? ED (erectile dysfunction)   ? Genital herpes   ? History of continuous positive airway pressure (CPAP) therapy   ? HTN (hypertension)   ? Hydronephrosis   ? Hyperlipidemia   ? Kidney stone on left side   ? LVH (left ventricular hypertrophy)   ? Obesity   ? Palpitation   ? Holter in May 2014 with NSR, PVC, rare PACs  ? Palpitation   ? Sleep apnea   ? Testosterone deficiency   ? ? ?Current Outpatient Medications:  ?  benazepril (LOTENSIN) 5 MG tablet, Take 1 tablet (5 mg total) by mouth daily., Disp: 90 tablet, Rfl: 3 ?  cetirizine (ZYRTEC) 10 MG tablet, Take 10 mg by mouth daily., Disp: , Rfl:  ?  colchicine 0.6 MG tablet, TAKE ONE TABLET BY MOUTH DAILY AS NEEDED FOR GOUT.  Okay to fill with mitigare or colcrys also., Disp: 30 tablet, Rfl: 2 ?  escitalopram (LEXAPRO) 10 MG tablet, Take 1 tablet (10 mg total) by mouth daily., Disp: 90 tablet, Rfl: 3 ?  fluconazole (DIFLUCAN) 150 MG tablet, Take 1 tablet (150 mg total) by mouth once a week., Disp: 4 tablet, Rfl: 0 ?  fluticasone (FLONASE) 50 MCG/ACT nasal spray, Place 2 sprays into both nostrils daily., Disp: , Rfl:  ?  nystatin (MYCOSTATIN/NYSTOP)  powder, Apply 1 application. topically 2 (two) times daily., Disp: 120 g, Rfl: 0 ?  omeprazole (PRILOSEC) 20 MG capsule, Take 20 mg by mouth daily., Disp: , Rfl:  ?  valACYclovir (VALTREX) 500 MG tablet, Take 1 tablet (500 mg total) by mouth daily., Disp: 90 tablet, Rfl: 3 ? ?Social History  ? ?Tobacco Use  ?Smoking Status Never  ?Smokeless Tobacco Never  ? ? ?Allergies  ?Allergen Reactions  ? Niacin And Related Hives  ? Penicillins   ?  rash  ? Rosuvastatin   ?  REACTION: myalgia  ? Simvastatin   ?  Myalgias- happened with vytorin but likely zetia was not the issue.    ? Zetia [Ezetimibe]   ?  aches  ? ?Objective:  ?There were no vitals filed for this visit. ?There is no height or weight on file to calculate BMI. ?Constitutional Well developed. ?Well nourished.  ?Vascular Dorsalis pedis pulses palpable bilaterally. ?Posterior tibial pulses palpable bilaterally. ?Capillary refill normal to all digits.  ?No cyanosis or clubbing noted. ?Pedal hair growth normal.  ?Neurologic Normal speech. ?Oriented to person, place, and time. ?Epicritic sensation to light touch grossly present bilaterally.  ?Dermatologic Nails well groomed and normal in appearance. ?No  open wounds. ?No skin lesions.  ?Orthopedic: Pain across the dorsum of the foot bilateral midfoot.  Pain on palpation to the region.  No pain with resisted dorsiflexion of the digits.  No pain at the Lisfranc interval.  ? ?Radiographs: None ?Assessment:  ? ?1. Capsulitis of right foot   ?2. Capsulitis of left foot   ? ?Plan:  ?Patient was evaluated and treated and all questions answered. ? ?Bilateral midfoot arthritis with underlying capsulitis ?-All questions and concerns were discussed with the patient in extensive detail.  Given the amount of pain that he is having I believe will benefit from a second steroid shot.  Patient agrees with plan like to proceed with second injection ?-A second steroid injection was performed at Bilateral dorsal midfoot using 1% plain  Lidocaine and 10 mg of Kenalog. This was well tolerated. ? ? ?No follow-ups on file.  ?

## 2021-08-28 DIAGNOSIS — G4733 Obstructive sleep apnea (adult) (pediatric): Secondary | ICD-10-CM | POA: Diagnosis not present

## 2021-09-09 ENCOUNTER — Other Ambulatory Visit: Payer: Self-pay | Admitting: Family Medicine

## 2021-09-11 DIAGNOSIS — G4733 Obstructive sleep apnea (adult) (pediatric): Secondary | ICD-10-CM | POA: Diagnosis not present

## 2021-09-17 ENCOUNTER — Ambulatory Visit: Payer: BC Managed Care – PPO | Admitting: Internal Medicine

## 2021-09-17 ENCOUNTER — Encounter: Payer: Self-pay | Admitting: Internal Medicine

## 2021-09-17 VITALS — BP 120/82 | HR 74 | Ht 69.0 in | Wt 212.6 lb

## 2021-09-17 DIAGNOSIS — M791 Myalgia, unspecified site: Secondary | ICD-10-CM | POA: Diagnosis not present

## 2021-09-17 DIAGNOSIS — R931 Abnormal findings on diagnostic imaging of heart and coronary circulation: Secondary | ICD-10-CM

## 2021-09-17 DIAGNOSIS — E785 Hyperlipidemia, unspecified: Secondary | ICD-10-CM | POA: Diagnosis not present

## 2021-09-17 DIAGNOSIS — T466X5A Adverse effect of antihyperlipidemic and antiarteriosclerotic drugs, initial encounter: Secondary | ICD-10-CM

## 2021-09-17 NOTE — Patient Instructions (Signed)
Medication Instructions:  Your physician recommends that you continue on your current medications as directed. Please refer to the Current Medication list given to you today.  *If you need a refill on your cardiac medications before your next appointment, please call your pharmacy*   Lab Work: FASTING lab work when able to check cholesterol   If you have labs (blood work) drawn today and your tests are completely normal, you will receive your results only by: [place] (if you have MyChart) OR A paper copy in the mail If you have any lab test that is abnormal or we need to change your treatment, we will call you to review the results.   Testing/Procedures: NONE   Follow-Up: At [facility], you and your health needs are our priority.  As part of our continuing mission to provide you with exceptional heart care, we have created designated Provider Care Teams.  These Care Teams include your primary Cardiologist (physician) and Advanced Practice Providers (APPs -  Physician Assistants and Nurse Practitioners) who all work together to provide you with the care you need, when you need it.  We recommend signing up for the patient portal called "MyChart".  Sign up information is provided on this After Visit Summary.  MyChart is used to connect with patients for Virtual Visits (Telemedicine).  Patients are able to view lab/test results, encounter notes, upcoming appointments, etc.  Non-urgent messages can be sent to your provider as well.   To learn more about what you can do with MyChart, go to [email or web address].    Your next appointment:    Follow up in about 4-5 months with Dr. Debara Pickett

## 2021-09-18 DIAGNOSIS — E785 Hyperlipidemia, unspecified: Secondary | ICD-10-CM | POA: Diagnosis not present

## 2021-09-18 NOTE — Progress Notes (Addendum)
LIPID CLINIC CONSULT NOTE  Chief Complaint:  Manage dyslipidemia  Primary Care Physician: Joseph Ghent, MD  Primary Cardiologist:  None  HPI:  Joseph Drake is a 61 y.o. male who is being seen today for the evaluation of dyslipidemia at the request of Joseph Drake, Joseph Rising, MD. this is a pleasant 61 year old male kindly referred for evaluation management of dyslipidemia.  He has a history of statin intolerance in the past and elevated cholesterol.  In December 2022 he had calcium scoring which was moderately elevated showing a calcium score 43, 63rd percentile for age and sex matched controls.  He was also noted to have some aortic atherosclerosis.  In July 2022 his direct LDL was 93, total cholesterol 139, triglycerides 219 and HDL 31.  He has not been on additional therapy.  He was briefly trialed on ezetimibe but also had similar side effects to the medication.  PMHx:  Past Medical History:  Diagnosis Date   Acid reflux    Allergy    BPH (benign prostatic hypertrophy)    Candida infection    Dyspnea    ED (erectile dysfunction)    Genital herpes    History of continuous positive airway pressure (CPAP) therapy    HTN (hypertension)    Hydronephrosis    Hyperlipidemia    Kidney stone on left side    LVH (left ventricular hypertrophy)    Obesity    Palpitation    Holter in May 2014 with NSR, PVC, rare PACs   Palpitation    Sleep apnea    Testosterone deficiency     Past Surgical History:  Procedure Laterality Date   CIRCUMCISION     NASAL SEPTUM SURGERY      FAMHx:  Family History  Problem Relation Age of Onset   Alcohol abuse Mother    Hyperlipidemia Mother    Hypertension Mother    Hypertension Father    Hyperlipidemia Father    Colon cancer Paternal Uncle    Prostate cancer Neg Hx    Kidney disease Neg Hx    Allergic rhinitis Neg Hx    Asthma Neg Hx    Kidney cancer Neg Hx    Bladder Cancer Neg Hx     SOCHx:   reports that he has never smoked.  He has never used smokeless tobacco. He reports current alcohol use. He reports that he does not use drugs.  ALLERGIES:  Allergies  Allergen Reactions   Niacin And Related Hives   Penicillins     rash   Rosuvastatin     REACTION: myalgia   Simvastatin     Myalgias- happened with vytorin but likely zetia was not the issue.     Zetia [Ezetimibe]     aches    ROS: Pertinent items noted in HPI and remainder of comprehensive ROS otherwise negative.  HOME MEDS: Current Outpatient Medications on File Prior to Visit  Medication Sig Dispense Refill   benazepril (LOTENSIN) 5 MG tablet Take 1 tablet (5 mg total) by mouth daily. 90 tablet 3   cetirizine (ZYRTEC) 10 MG tablet Take 10 mg by mouth daily.     colchicine 0.6 MG tablet TAKE ONE TABLET BY MOUTH DAILY AS NEEDED FOR GOUT.  Okay to fill with mitigare or colcrys also. 30 tablet 2   escitalopram (LEXAPRO) 10 MG tablet Take 1 tablet (10 mg total) by mouth daily. 90 tablet 3   fluconazole (DIFLUCAN) 150 MG tablet Take 1 tablet (150 mg total) by  mouth once a week. 4 tablet 0   fluticasone (FLONASE) 50 MCG/ACT nasal spray Place 2 sprays into both nostrils daily.     nystatin (MYCOSTATIN/NYSTOP) powder APPLY TO AFFECTED AREA TWICE A DAY 120 g 0   omeprazole (PRILOSEC) 20 MG capsule Take 20 mg by mouth daily.     valACYclovir (VALTREX) 500 MG tablet Take 1 tablet (500 mg total) by mouth daily. 90 tablet 3   cephALEXin (KEFLEX) 500 MG capsule Take 500 mg by mouth every 8 (eight) hours. (Patient not taking: Reported on 09/17/2021)     chlorhexidine (PERIDEX) 0.12 % solution SMARTSIG:0.5 Ounce(s) By Mouth Morning-Night (Patient not taking: Reported on 09/17/2021)     doxycycline (VIBRAMYCIN) 100 MG capsule Take 100 mg by mouth daily. (Patient not taking: Reported on 09/17/2021)     No current facility-administered medications on file prior to visit.    LABS/IMAGING: No results found for this or any previous visit (from the past 48 hour(s)). No  results found.  LIPID PANEL:    Component Value Date/Time   CHOL 139 11/13/2020 0748   TRIG 219.0 (H) 11/13/2020 0748   TRIG 126 04/15/2006 1015   HDL 31.30 (L) 11/13/2020 0748   CHOLHDL 4 11/13/2020 0748   VLDL 43.8 (H) 11/13/2020 0748   LDLCALC 73 08/12/2019 0740   LDLDIRECT 93.0 11/13/2020 0748    WEIGHTS: Wt Readings from Last 3 Encounters:  09/17/21 212 lb 9.6 oz (96.4 kg)  07/30/21 215 lb (97.5 kg)  03/19/21 226 lb (102.5 kg)    VITALS: BP 120/82   Pulse 74   Ht '5\' 9"'$  (1.753 m)   Wt 212 lb 9.6 oz (96.4 kg)   SpO2 96%   BMI 31.40 kg/m   EXAM: General appearance: alert and no distress Neck: no carotid bruit, no JVD, and thyroid not enlarged, symmetric, no tenderness/mass/nodules Lungs: clear to auscultation bilaterally Heart: regular rate and rhythm, S1, S2 normal, no murmur, click, rub or gallop Abdomen: soft, non-tender; bowel sounds normal; no masses,  no organomegaly Extremities: extremities normal, atraumatic, no cyanosis or edema Pulses: 2+ and symmetric Skin: Skin color, texture, turgor normal. No rashes or lesions Neurologic: Grossly normal Psych: Pleasant  EKG: N/A  ASSESSMENT: Mixed dyslipidemia, goal LDL less than 70 Statin and ezetimibe intolerant-myalgias Abnormal CAC score of 42, 63rd percentile for age and sex matched control-aortic atherosclerosis (03/2021)  PLAN: 1.   Joseph Drake has an abnormal coronary calcium score and cannot tolerate statins and ezetimibe in the past.  He will need further lipid-lowering to reach target LDL less than 70.  LDL was in the 90s and he reports he has made some significant dietary changes and lifestyle changes.  I would recommend we repeat his lipids including an NMR and LP(a) and consider other therapeutic options based on this updated lab work.  He is in agreement with that.  Plan ultimately follow-up in about 3 to 4 months if we make any changes to his current regimen.  Thanks again for the kind  referral.  Joseph Casino, MD, Joseph Drake, [place] Director of the Advanced Lipid Disorders &  Cardiovascular Risk Reduction Clinic Diplomate of the American Board of Clinical Lipidology Attending Cardiologist  Direct Dial: [phone]  Fax: [phone]  Website:  [email or web address] 09/18/2021, 12:39 PM

## 2021-09-19 LAB — LIPOPROTEIN A (LPA): Lipoprotein (a): 41.4 nmol/L (ref <75.0)

## 2021-09-19 LAB — NMR, LIPOPROFILE
Cholesterol, Total: 260 mg/dL — ABNORMAL HIGH (ref 100–199)
HDL Particle Number: 23.2 umol/L — ABNORMAL LOW (ref >=30.5)
HDL-C: 31 mg/dL — ABNORMAL LOW (ref >39)
LDL Particle Number: 2640 nmol/L — ABNORMAL HIGH (ref <1000)
LDL Size: 19.8 nm — ABNORMAL LOW (ref >20.5)
LDL-C (NIH Calc): 179 mg/dL — ABNORMAL HIGH (ref 0–99)
LP-IR Score: 75 — ABNORMAL HIGH (ref <=45)
Small LDL Particle Number: 1897 nmol/L — ABNORMAL HIGH (ref <=527)
Triglycerides: 262 mg/dL — ABNORMAL HIGH (ref 0–149)

## 2021-10-03 DIAGNOSIS — K045 Chronic apical periodontitis: Secondary | ICD-10-CM | POA: Diagnosis not present

## 2021-10-08 ENCOUNTER — Telehealth: Payer: Self-pay | Admitting: Internal Medicine

## 2021-10-08 DIAGNOSIS — E785 Hyperlipidemia, unspecified: Secondary | ICD-10-CM

## 2021-10-08 NOTE — Telephone Encounter (Signed)
Additional PA requirements/form faxed

## 2021-10-08 NOTE — Telephone Encounter (Signed)
Faxed PA for Repatha to [employer] at [phone]

## 2021-10-09 NOTE — Telephone Encounter (Signed)
Received notice from [employer] that coverage for Repatha has been denied  Med can only be approved for primary hyperlipidemia if patient has HeFH wih long list of criteria OR ASCVD confirmed by ACS, h/o HI, stable/unstable angina, coronary or other arterial revascularization, stroke, TIA, PAD of atherosclerotic origin   Primary HLD/HeFH crieria: Untreated/pre-treatment LDL >180 AND one of the following: - family h/o MI in 1st degree relative less than age 56, family h/o MI in 2nd degree relative less than age 57, family h/o LDL >190 in 1st/2nd degree relative OR both of the following: untreated/pre-treatment LDL >190 + 1 of the following: mutation in LDL-R, ApoB, PCSK9 gene, tendinous xanthomata, arcus cornealis before age 57    Decision can be appealed: [place] Attn: Clinical Review Dept - Appeal Request Fax: [phone] EOC: [id number] Member has to sign form regarding request

## 2021-10-12 DIAGNOSIS — G4733 Obstructive sleep apnea (adult) (pediatric): Secondary | ICD-10-CM | POA: Diagnosis not present

## 2021-10-15 NOTE — Telephone Encounter (Signed)
MD sent message to research nurse  MyChart message sent to patient with update

## 2021-10-15 NOTE — Telephone Encounter (Signed)
I have forwarded this message to Therese Sarah who is the coordinator for this trial. I will have her reach out to let you know if he does qualify

## 2021-10-15 NOTE — Telephone Encounter (Signed)
Options are limited-  had some coronary calcium (63) - will look into clinical trial of inclisirin? Also, if he is not a trial candidate - then would try Nexletol.  -DR. H

## 2021-10-22 NOTE — Telephone Encounter (Signed)
Chrystie Nose, MD  Lindell Spar, RN Caller: Unspecified (2 weeks ago) He does not qualify for any studies - unfortunate that they denied PCSK9 as cholesterol is high and he has coronary calcium. Would try to get Nexletol approved.   Dr Rexene Edison

## 2021-10-23 NOTE — Telephone Encounter (Signed)
PA for nexletol submitted via [email or web address] Prior Auth (EOC) ID: [id number]

## 2021-10-28 MED ORDER — NEXLETOL 180 MG PO TABS: 1.0000 | ORAL_TABLET | Freq: Every day | ORAL | 3 refills | Status: DC

## 2021-10-28 NOTE — Addendum Note (Signed)
Addended by: Fidel Levy on: 10/28/2021 11:42 AM   Modules accepted: Orders

## 2021-10-28 NOTE — Telephone Encounter (Signed)
Nexletol approved from 10/23/21 -- 04/24/22

## 2021-11-11 ENCOUNTER — Other Ambulatory Visit: Payer: Self-pay | Admitting: Family Medicine

## 2021-11-11 DIAGNOSIS — G4733 Obstructive sleep apnea (adult) (pediatric): Secondary | ICD-10-CM | POA: Diagnosis not present

## 2021-11-28 ENCOUNTER — Encounter: Payer: Self-pay | Admitting: Internal Medicine

## 2021-12-12 DIAGNOSIS — G4733 Obstructive sleep apnea (adult) (pediatric): Secondary | ICD-10-CM | POA: Diagnosis not present

## 2022-01-12 DIAGNOSIS — G4733 Obstructive sleep apnea (adult) (pediatric): Secondary | ICD-10-CM | POA: Diagnosis not present

## 2022-01-13 DIAGNOSIS — L821 Other seborrheic keratosis: Secondary | ICD-10-CM | POA: Diagnosis not present

## 2022-01-13 DIAGNOSIS — D492 Neoplasm of unspecified behavior of bone, soft tissue, and skin: Secondary | ICD-10-CM | POA: Diagnosis not present

## 2022-01-13 DIAGNOSIS — L820 Inflamed seborrheic keratosis: Secondary | ICD-10-CM | POA: Diagnosis not present

## 2022-01-24 DIAGNOSIS — E785 Hyperlipidemia, unspecified: Secondary | ICD-10-CM | POA: Diagnosis not present

## 2022-01-25 LAB — NMR, LIPOPROFILE
Cholesterol, Total: 219 mg/dL — ABNORMAL HIGH (ref 100–199)
HDL Particle Number: 22.3 umol/L — ABNORMAL LOW (ref >=30.5)
HDL-C: 24 mg/dL — ABNORMAL LOW (ref >39)
LDL Particle Number: 2513 nmol/L — ABNORMAL HIGH (ref <1000)
LDL Size: 19.8 nm — ABNORMAL LOW (ref >20.5)
LDL-C (NIH Calc): 156 mg/dL — ABNORMAL HIGH (ref 0–99)
LP-IR Score: 84 — ABNORMAL HIGH (ref <=45)
Small LDL Particle Number: 1907 nmol/L — ABNORMAL HIGH (ref <=527)
Triglycerides: 209 mg/dL — ABNORMAL HIGH (ref 0–149)

## 2022-01-31 ENCOUNTER — Encounter: Payer: Self-pay | Admitting: Internal Medicine

## 2022-01-31 ENCOUNTER — Ambulatory Visit: Payer: BC Managed Care – PPO | Attending: Internal Medicine | Admitting: Internal Medicine

## 2022-01-31 VITALS — BP 102/70 | HR 71 | Ht 69.0 in | Wt 224.0 lb

## 2022-01-31 DIAGNOSIS — R931 Abnormal findings on diagnostic imaging of heart and coronary circulation: Secondary | ICD-10-CM

## 2022-01-31 DIAGNOSIS — E785 Hyperlipidemia, unspecified: Secondary | ICD-10-CM

## 2022-01-31 DIAGNOSIS — M791 Myalgia, unspecified site: Secondary | ICD-10-CM

## 2022-01-31 DIAGNOSIS — T466X5D Adverse effect of antihyperlipidemic and antiarteriosclerotic drugs, subsequent encounter: Secondary | ICD-10-CM | POA: Diagnosis not present

## 2022-01-31 MED ORDER — NEXLETOL 180 MG PO TABS: 1.0000 | ORAL_TABLET | Freq: Every day | ORAL | 3 refills | Status: DC

## 2022-01-31 NOTE — Patient Instructions (Signed)
Medication Instructions:  Retrial Nexletol-let us know if you have issues  *If you need a refill on your cardiac medications before your next appointment, please call your pharmacy*  Lab Work: Please return for FASTING labs in 4 months (NMR lipoprofile)  Our in office lab hours are Monday-Friday 8:00-4:00, closed for lunch 12:45-1:45 pm.  No appointment needed.  [facility] locations:   [place] ([place]) - [address] [place] [address] - [address] [place] - [address] ([facility])  Follow-Up: At [facility], you and your health needs are our priority.  As part of our continuing mission to provide you with exceptional heart care, we have created designated Provider Care Teams.  These Care Teams include your primary Cardiologist (physician) and Advanced Practice Providers (APPs -  Physician Assistants and Nurse Practitioners) who all work together to provide you with the care you need, when you need it.  We recommend signing up for the patient portal called "MyChart".  Sign up information is provided on this After Visit Summary.  MyChart is used to connect with patients for Virtual Visits (Telemedicine).  Patients are able to view lab/test results, encounter notes, upcoming appointments, etc.  Non-urgent messages can be sent to your provider as well.   To learn more about what you can do with MyChart, go to [email or web address].    Your next appointment:   4 month(s)  The format for your next appointment:   In Person  Provider:   Dr. Debara Pickett

## 2022-01-31 NOTE — Progress Notes (Signed)
LIPID CLINIC CONSULT NOTE  Chief Complaint:  Follow-up dyslipidemia  Primary Care Physician: Tonia Ghent, MD  Primary Cardiologist:  None  HPI:  Joseph Drake is a 61 y.o. male who is being seen today for the evaluation of dyslipidemia at the request of Damita Dunnings, Elveria Rising, MD. this is a pleasant 61 year old male kindly referred for evaluation management of dyslipidemia.  He has a history of statin intolerance in the past and elevated cholesterol.  In December 2022 he had calcium scoring which was moderately elevated showing a calcium score 43, 63rd percentile for age and sex matched controls.  He was also noted to have some aortic atherosclerosis.  In July 2022 his direct LDL was 93, total cholesterol 139, triglycerides 219 and HDL 31.  He has not been on additional therapy.  He was briefly trialed on ezetimibe but also had similar side effects to the medication.  01/31/2022  Mr. Terhaar returns today for follow-up.  He had been denied PCSK9 inhibitor by insurance as he was not felt that he had a familial hyperlipidemia as his LDL cholesterol was not over 190.  He does have very high cholesterol however and evidence of coronary artery calcification.  Unfortunately cannot tolerate statins or ezetimibe.  We had recommended Nexletol.  He took this for a short period of time and then developed some diarrhea.  He said after stopping the medicine it went away immediately.  He had not retrialed that.  Options are otherwise limited.  While it is likely that this could be the medicine, it is not a typical side effect.  Is also possible he could have had an infectious diarrhea that was self-limited.  I would advise him to retrial the medicine again and if he has side effects, obviously he would have to stop it.  We then need to consider another attempt at statin therapy, possibly Livalo.   PMHx:  Past Medical History:  Diagnosis Date   Acid reflux    Allergy    BPH (benign prostatic  hypertrophy)    Candida infection    Dyspnea    ED (erectile dysfunction)    Genital herpes    History of continuous positive airway pressure (CPAP) therapy    HTN (hypertension)    Hydronephrosis    Hyperlipidemia    Kidney stone on left side    LVH (left ventricular hypertrophy)    Obesity    Palpitation    Holter in May 2014 with NSR, PVC, rare PACs   Palpitation    Sleep apnea    Testosterone deficiency     Past Surgical History:  Procedure Laterality Date   CIRCUMCISION     NASAL SEPTUM SURGERY      FAMHx:  Family History  Problem Relation Age of Onset   Alcohol abuse Mother    Hyperlipidemia Mother    Hypertension Mother    Hypertension Father    Hyperlipidemia Father    Colon cancer Paternal Uncle    Prostate cancer Neg Hx    Kidney disease Neg Hx    Allergic rhinitis Neg Hx    Asthma Neg Hx    Kidney cancer Neg Hx    Bladder Cancer Neg Hx     SOCHx:   reports that he has never smoked. He has never used smokeless tobacco. He reports current alcohol use. He reports that he does not use drugs.  ALLERGIES:  Allergies  Allergen Reactions   Niacin And Related Hives   Penicillins  rash   Rosuvastatin     REACTION: myalgia   Simvastatin     Myalgias- happened with vytorin but likely zetia was not the issue.     Zetia [Ezetimibe]     aches    ROS: Pertinent items noted in HPI and remainder of comprehensive ROS otherwise negative.  HOME MEDS: Current Outpatient Medications on File Prior to Visit  Medication Sig Dispense Refill   benazepril (LOTENSIN) 5 MG tablet TAKE 1 TABLET BY MOUTH  DAILY 90 tablet 3   cetirizine (ZYRTEC) 10 MG tablet Take 10 mg by mouth daily.     colchicine 0.6 MG tablet TAKE ONE TABLET BY MOUTH DAILY AS NEEDED FOR GOUT.  Okay to fill with mitigare or colcrys also. 30 tablet 2   escitalopram (LEXAPRO) 10 MG tablet TAKE 1 TABLET BY MOUTH  DAILY 90 tablet 3   fluticasone (FLONASE) 50 MCG/ACT nasal spray Place 2 sprays into both  nostrils daily.     omeprazole (PRILOSEC) 20 MG capsule Take 20 mg by mouth daily.     valACYclovir (VALTREX) 500 MG tablet TAKE 1 TABLET BY MOUTH  DAILY 90 tablet 3   No current facility-administered medications on file prior to visit.    LABS/IMAGING: No results found for this or any previous visit (from the past 48 hour(s)). No results found.  LIPID PANEL:    Component Value Date/Time   CHOL 139 11/13/2020 0748   TRIG 219.0 (H) 11/13/2020 0748   TRIG 126 04/15/2006 1015   HDL 31.30 (L) 11/13/2020 0748   CHOLHDL 4 11/13/2020 0748   VLDL 43.8 (H) 11/13/2020 0748   LDLCALC 73 08/12/2019 0740   LDLDIRECT 93.0 11/13/2020 0748    WEIGHTS: Wt Readings from Last 3 Encounters:  01/31/22 224 lb (101.6 kg)  09/17/21 212 lb 9.6 oz (96.4 kg)  07/30/21 215 lb (97.5 kg)    VITALS: BP 102/70 (BP Location: Left Arm, Patient Position: Sitting, Cuff Size: Normal)   Pulse 71   Ht '5\' 9"'$  (1.753 m)   Wt 224 lb (101.6 kg)   BMI 33.08 kg/m   EXAM: Deferred  EKG: N/A  ASSESSMENT: Mixed dyslipidemia, goal LDL less than 70 Statin and ezetimibe intolerant-myalgias Abnormal CAC score of 65, 63rd percentile for age and sex matched control-aortic atherosclerosis (03/2021)  PLAN: 1.   Mr. Litt had some possible side effects on Nexletol including diarrhea which seem to go away fairly quickly after stopping the medicine.  This may have been related although it is less common of a side effect.  It is also possible he could have had a self-limited GI illness.  I would like him to retry the medicine to see if it is tolerated.  If so, we will repeat lipids in about 3 to 4 months.  If not will consider trial of an alternative statin such as Livalo and still repeat the labs in about 4 months.  Follow-up at that time.  Pixie Casino, MD, [facility], [place] Director of the Advanced Lipid Disorders &  Cardiovascular Risk Reduction Clinic Diplomate of the American  Board of Clinical Lipidology Attending Cardiologist  Direct Dial: [phone]  Fax: [phone]  Website:  [email or web address] 01/31/2022, 3:27 PM

## 2022-02-10 ENCOUNTER — Encounter: Payer: Self-pay | Admitting: Internal Medicine

## 2022-02-13 MED ORDER — LIVALO 4 MG PO TABS: 1.0000 | ORAL_TABLET | Freq: Every day | ORAL | 3 refills | Status: DC

## 2022-02-18 MED ORDER — ZYPITAMAG 4 MG PO TABS: 1.0000 | ORAL_TABLET | Freq: Every day | ORAL | 11 refills | Status: DC

## 2022-05-08 ENCOUNTER — Ambulatory Visit: Payer: BC Managed Care – PPO | Admitting: Podiatry

## 2022-05-08 ENCOUNTER — Encounter: Payer: Self-pay | Admitting: Podiatry

## 2022-05-08 DIAGNOSIS — M778 Other enthesopathies, not elsewhere classified: Secondary | ICD-10-CM | POA: Diagnosis not present

## 2022-05-08 MED ORDER — TRIAMCINOLONE ACETONIDE 10 MG/ML IJ SUSP
20.0000 mg | Freq: Once | INTRAMUSCULAR | Status: AC
  Administered 2022-05-08: 20 mg

## 2022-05-09 NOTE — Progress Notes (Signed)
Subjective:   Patient ID: Joseph Drake, male   DOB: 62 y.o.   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

## 2022-06-10 ENCOUNTER — Other Ambulatory Visit: Payer: Self-pay | Admitting: *Deleted

## 2022-06-10 ENCOUNTER — Encounter: Payer: Self-pay | Admitting: Internal Medicine

## 2022-06-10 DIAGNOSIS — E785 Hyperlipidemia, unspecified: Secondary | ICD-10-CM

## 2022-06-17 ENCOUNTER — Other Ambulatory Visit (INDEPENDENT_AMBULATORY_CARE_PROVIDER_SITE_OTHER): Payer: BC Managed Care – PPO

## 2022-06-17 ENCOUNTER — Other Ambulatory Visit: Payer: Self-pay | Admitting: Family Medicine

## 2022-06-17 DIAGNOSIS — Z125 Encounter for screening for malignant neoplasm of prostate: Secondary | ICD-10-CM

## 2022-06-17 DIAGNOSIS — M109 Gout, unspecified: Secondary | ICD-10-CM | POA: Diagnosis not present

## 2022-06-17 DIAGNOSIS — R739 Hyperglycemia, unspecified: Secondary | ICD-10-CM | POA: Diagnosis not present

## 2022-06-17 DIAGNOSIS — E785 Hyperlipidemia, unspecified: Secondary | ICD-10-CM | POA: Diagnosis not present

## 2022-06-17 LAB — LDL CHOLESTEROL, DIRECT: Direct LDL: 136 mg/dL

## 2022-06-17 LAB — LIPID PANEL
Cholesterol: 189 mg/dL (ref 0–200)
HDL: 35.7 mg/dL — ABNORMAL LOW (ref >39.00)
NonHDL: 153.14
Total CHOL/HDL Ratio: 5
Triglycerides: 222 mg/dL — ABNORMAL HIGH (ref 0.0–149.0)
VLDL: 44.4 mg/dL — ABNORMAL HIGH (ref 0.0–40.0)

## 2022-06-17 LAB — COMPREHENSIVE METABOLIC PANEL
ALT: 35 U/L (ref 0–53)
AST: 23 U/L (ref 0–37)
Albumin: 4.2 g/dL (ref 3.5–5.2)
Alkaline Phosphatase: 94 U/L (ref 39–117)
BUN: 17 mg/dL (ref 6–23)
CO2: 29 mEq/L (ref 19–32)
Calcium: 9.3 mg/dL (ref 8.4–10.5)
Chloride: 101 mEq/L (ref 96–112)
Creatinine, Ser: 1.21 mg/dL (ref 0.40–1.50)
GFR: 64.08 mL/min (ref >60.00)
Glucose, Bld: 106 mg/dL — ABNORMAL HIGH (ref 70–99)
Potassium: 4.5 mEq/L (ref 3.5–5.1)
Sodium: 138 mEq/L (ref 135–145)
Total Bilirubin: 0.6 mg/dL (ref 0.2–1.2)
Total Protein: 7.2 g/dL (ref 6.0–8.3)

## 2022-06-17 LAB — URIC ACID: Uric Acid, Serum: 8.4 mg/dL — ABNORMAL HIGH (ref 4.0–7.8)

## 2022-06-17 LAB — PSA: PSA: 0.63 ng/mL (ref 0.10–4.00)

## 2022-06-17 LAB — HEMOGLOBIN A1C: Hgb A1c MFr Bld: 6.3 % (ref 4.6–6.5)

## 2022-06-17 NOTE — Addendum Note (Signed)
Addended by: Ellamae Sia on: 06/17/2022 09:47 AM   Modules accepted: Orders

## 2022-06-19 ENCOUNTER — Ambulatory Visit: Payer: BC Managed Care – PPO | Admitting: Internal Medicine

## 2022-06-24 ENCOUNTER — Encounter: Payer: Self-pay | Admitting: Family Medicine

## 2022-06-24 ENCOUNTER — Ambulatory Visit (INDEPENDENT_AMBULATORY_CARE_PROVIDER_SITE_OTHER): Payer: BC Managed Care – PPO | Admitting: Family Medicine

## 2022-06-24 VITALS — BP 102/70 | HR 79 | Temp 98.3°F | Ht 69.0 in | Wt 235.0 lb

## 2022-06-24 DIAGNOSIS — Z7189 Other specified counseling: Secondary | ICD-10-CM

## 2022-06-24 DIAGNOSIS — Z0000 Encounter for general adult medical examination without abnormal findings: Secondary | ICD-10-CM

## 2022-06-24 DIAGNOSIS — M542 Cervicalgia: Secondary | ICD-10-CM

## 2022-06-24 DIAGNOSIS — G4733 Obstructive sleep apnea (adult) (pediatric): Secondary | ICD-10-CM

## 2022-06-24 DIAGNOSIS — E785 Hyperlipidemia, unspecified: Secondary | ICD-10-CM

## 2022-06-24 DIAGNOSIS — M109 Gout, unspecified: Secondary | ICD-10-CM

## 2022-06-24 DIAGNOSIS — F419 Anxiety disorder, unspecified: Secondary | ICD-10-CM

## 2022-06-24 DIAGNOSIS — I10 Essential (primary) hypertension: Secondary | ICD-10-CM

## 2022-06-24 NOTE — Patient Instructions (Addendum)
Hold cholesterol med when/if taking colchicine.   Take care.  Glad to see you. Keep working on diet and exercise.  Thanks for your effort.

## 2022-06-24 NOTE — Progress Notes (Unsigned)
CPE- See plan.  Routine anticipatory guidance given to patient.  See health maintenance.  The possibility exists that previously documented standard health maintenance information may have been brought forward from a previous encounter into this note.  If needed, that same information has been updated to reflect the current situation based on today's encounter.    Flu done yearly per patient report, at work.  done ~01/2022.   Tetanus 2022 PNA not due. shingrix 2020 covid vaccine 2021, pfizer. Colonoscopy 2017 PSA 2024.  PSA similar to 2023.  Living will- wife designated if patient were incapacitated.   HCV preg nev HIV screening done, neg, in 1990s.   Diet and exercise, d/w pt.    Hypertension:    Using medication without problems or lightheadedness: yes Chest pain with exertion:no Edema:no Short of breath:no Still using CPAP nightly.  Sleeping well with that.    Elevated Cholesterol: Using medications without problems: some aches on pitavastatin. Stopped med, improved, then restarted med. No return of aches in the meantime, back on med for about 2 weeks.  Prev aches could have been from exercise routine at the time.   Muscle aches:  see above.   Diet compliance: d/w pt.  Exercise: d/w pt.  Recent labs d/w pt.    Mood d/w pt.  Mother currently inpatient, at [facility].  He is considering placement for his mother.  Support offered.  Mood is still good.  Compliant with lexapro.  No ADE on med.  It helped.  No SI/HI.    Gout.  Recent gout flare, about 1 month ago.  Rare o/w.  D/w pt about diet.  Used colchicine with relief, cautions d/w pt.    Prev crepitus on neck ROM, bilaterally.  Some better in the meantime.  He has been stretching in the meantime, using heat.    PMH and SH reviewed  Meds, vitals, and allergies reviewed.   ROS: Per HPI.  Unless specifically indicated otherwise in HPI, the patient denies:  General: fever. Eyes: acute vision changes ENT: sore  throat Cardiovascular: chest pain Respiratory: SOB GI: vomiting GU: dysuria Musculoskeletal: acute back pain Derm: acute rash Neuro: acute motor dysfunction Psych: worsening mood Endocrine: polydipsia Heme: bleeding Allergy: hayfever  GEN: nad, alert and oriented HEENT: ncat NECK: supple w/o LA CV: rrr. PULM: ctab, no inc wob ABD: soft, +bs EXT: no edema SKIN: no acute rash  See after visit summary.

## 2022-06-25 DIAGNOSIS — M542 Cervicalgia: Secondary | ICD-10-CM | POA: Insufficient documentation

## 2022-06-25 NOTE — Assessment & Plan Note (Signed)
Discussed stretching he can update me as needed.

## 2022-06-25 NOTE — Assessment & Plan Note (Signed)
Recent gout flare, about 1 month ago.  Rare o/w.  D/w pt about diet.  Used colchicine with relief, cautions d/w pt.

## 2022-06-25 NOTE — Assessment & Plan Note (Signed)
  Living will- wife designated if patient were incapacitated.

## 2022-06-25 NOTE — Assessment & Plan Note (Signed)
Support offered regarding recent events. Mood is still good.  Compliant with lexapro.  No ADE on med.  It helped.  No SI/HI.  Would continue Lexapro as is.

## 2022-06-25 NOTE — Assessment & Plan Note (Signed)
Recent labs discussed with patient.  He had only been back on pitavastatin for about 2 weeks.  Would continue as is and continue work on diet and exercise.  Will update cardiology as FYI.  He has cardiology follow-up pending.

## 2022-06-25 NOTE — Assessment & Plan Note (Signed)
Continue work on diet and exercise.  Continue benazepril

## 2022-06-25 NOTE — Assessment & Plan Note (Signed)
Continue CPAP.  

## 2022-06-25 NOTE — Assessment & Plan Note (Signed)
Flu done yearly per patient report, at work.  done ~01/2022.   Tetanus 2022 PNA not due. shingrix 2020 covid vaccine 2021, pfizer. Colonoscopy 2017 PSA 2024.  PSA similar to 2023.  Living will- wife designated if patient were incapacitated.   HCV preg nev HIV screening done, neg, in 1990s.   Diet and exercise, d/w pt.

## 2022-07-14 DIAGNOSIS — L814 Other melanin hyperpigmentation: Secondary | ICD-10-CM | POA: Diagnosis not present

## 2022-07-14 DIAGNOSIS — L821 Other seborrheic keratosis: Secondary | ICD-10-CM | POA: Diagnosis not present

## 2022-07-14 DIAGNOSIS — D225 Melanocytic nevi of trunk: Secondary | ICD-10-CM | POA: Diagnosis not present

## 2022-07-14 DIAGNOSIS — L570 Actinic keratosis: Secondary | ICD-10-CM | POA: Diagnosis not present

## 2022-07-14 DIAGNOSIS — D492 Neoplasm of unspecified behavior of bone, soft tissue, and skin: Secondary | ICD-10-CM | POA: Diagnosis not present

## 2022-07-14 DIAGNOSIS — L538 Other specified erythematous conditions: Secondary | ICD-10-CM | POA: Diagnosis not present

## 2022-08-12 DIAGNOSIS — E785 Hyperlipidemia, unspecified: Secondary | ICD-10-CM | POA: Diagnosis not present

## 2022-08-13 LAB — NMR, LIPOPROFILE
Cholesterol, Total: 174 mg/dL (ref 100–199)
HDL Particle Number: 28.6 umol/L — ABNORMAL LOW (ref >=30.5)
HDL-C: 26 mg/dL — ABNORMAL LOW (ref >39)
LDL Particle Number: 1547 nmol/L — ABNORMAL HIGH (ref <1000)
LDL Size: 19.6 nm — ABNORMAL LOW (ref >20.5)
LDL-C (NIH Calc): 80 mg/dL (ref 0–99)
LP-IR Score: 93 — ABNORMAL HIGH (ref <=45)
Small LDL Particle Number: 1338 nmol/L — ABNORMAL HIGH (ref <=527)
Triglycerides: 419 mg/dL — ABNORMAL HIGH (ref 0–149)

## 2022-08-14 DIAGNOSIS — G4733 Obstructive sleep apnea (adult) (pediatric): Secondary | ICD-10-CM | POA: Diagnosis not present

## 2022-08-18 ENCOUNTER — Other Ambulatory Visit (HOSPITAL_COMMUNITY): Payer: Self-pay

## 2022-08-18 ENCOUNTER — Telehealth: Payer: Self-pay | Admitting: Internal Medicine

## 2022-08-18 ENCOUNTER — Ambulatory Visit: Payer: BC Managed Care – PPO | Attending: Internal Medicine | Admitting: Internal Medicine

## 2022-08-18 VITALS — BP 128/82 | HR 67 | Ht 68.0 in | Wt 240.2 lb

## 2022-08-18 DIAGNOSIS — M791 Myalgia, unspecified site: Secondary | ICD-10-CM

## 2022-08-18 DIAGNOSIS — E785 Hyperlipidemia, unspecified: Secondary | ICD-10-CM

## 2022-08-18 DIAGNOSIS — R931 Abnormal findings on diagnostic imaging of heart and coronary circulation: Secondary | ICD-10-CM | POA: Diagnosis not present

## 2022-08-18 DIAGNOSIS — T466X5D Adverse effect of antihyperlipidemic and antiarteriosclerotic drugs, subsequent encounter: Secondary | ICD-10-CM

## 2022-08-18 DIAGNOSIS — I739 Peripheral vascular disease, unspecified: Secondary | ICD-10-CM

## 2022-08-18 DIAGNOSIS — T466X5A Adverse effect of antihyperlipidemic and antiarteriosclerotic drugs, initial encounter: Secondary | ICD-10-CM

## 2022-08-18 NOTE — Patient Instructions (Signed)
Medication Instructions:  Dr. Rennis Golden recommends Repatha Sureclick or Praluent (PCSK9). This is an injectable cholesterol medication self-administered once every 14 days. This medication will likely need prior approval with your insurance company, which we will work on. If the medication is not approved initially, we may need to do an appeal with your insurance.   Administer medication in area of fatty tissue such as abdomen, outer thigh, back of upper arm - and rotate site with each injection Store medication in refrigerator until ready to administer - allow to sit at room temp for 30 mins - 1 hour prior to injection Dispose of medication in a SHARPS container - your pharmacy should be able to direct you on this and proper disposal   If you need a co-pay card for Repatha: Lawsponsor.fr If you need a co-pay card for Praluent: [email or web address]  Patient Assistance:    These foundations have funds at various times.   The PAN Foundation: https://www.panfoundation.org/disease-funds/hypercholesterolemia/ -- can sign up for wait list  The [facility] offers assistance to help pay for medication copays.  They will cover copays for all cholesterol lowering meds, including statins, fibrates, omega-3 fish oils like Vascepa, ezetimibe, Repatha, Praluent, Nexletol, Nexlizet.  The cards are usually good for $2,500 or 12 months, whichever comes first. Go to healthwellfoundation.org Click on "Apply Now" Answer questions as to whom is applying (patient or representative) Your disease fund will be "hypercholesterolemia - Medicare access" They will ask questions about finances and which medications you are taking for cholesterol When you submit, the approval is usually within minutes.  You will need to print the card information from the site You will need to show this information to your pharmacy, they will bill your Medicare Part D plan first -then  bill Health Well --for the copay.   You can also call them at [phone], although the hold times can be quite long.     *If you need a refill on your cardiac medications before your next appointment, please call your pharmacy*   Lab Work: FASTING NMR lipoprofile in 3-4 months -- before next appointment   If you have labs (blood work) drawn today and your tests are completely normal, you will receive your results only by: MyChart Message (if you have MyChart) OR A paper copy in the mail If you have any lab test that is abnormal or we need to change your treatment, we will call you to review the results.   Follow-Up: At [facility], you and your health needs are our priority.  As part of our continuing mission to provide you with exceptional heart care, we have created designated Provider Care Teams.  These Care Teams include your primary Cardiologist (physician) and Advanced Practice Providers (APPs -  Physician Assistants and Nurse Practitioners) who all work together to provide you with the care you need, when you need it.  We recommend signing up for the patient portal called "MyChart".  Sign up information is provided on this After Visit Summary.  MyChart is used to connect with patients for Virtual Visits (Telemedicine).  Patients are able to view lab/test results, encounter notes, upcoming appointments, etc.  Non-urgent messages can be sent to your provider as well.   To learn more about what you can do with MyChart, go to [email or web address].    Your next appointment:    3-4 months with Dr. Rennis Golden

## 2022-08-18 NOTE — Telephone Encounter (Signed)
Patient needs PA for Repatha Sureclick /mL Dx: coronary artery calcifications, peripheral arterial disease, statin-intolerant, cannot take zetia or nexletol   Attempted to start PA in [facility] does not handle this review. Please visit [email or web address] to start a prior authorization or fax information to [phone]. Please include all supporting chart notes. You may contact [employer] at [phone].

## 2022-08-18 NOTE — Progress Notes (Signed)
LIPID CLINIC CONSULT NOTE  Chief Complaint:  Follow-up dyslipidemia  Primary Care Physician: Joaquim Nam, MD  Primary Cardiologist:  None  HPI:  Joseph Drake is a 62 y.o. male who is being seen today for the evaluation of dyslipidemia at the request of Para March, Dwana Curd, MD. this is a pleasant 62 year old male kindly referred for evaluation management of dyslipidemia.  He has a history of statin intolerance in the past and elevated cholesterol.  In December 2022 he had calcium scoring which was moderately elevated showing a calcium score 43, 63rd percentile for age and sex matched controls.  He was also noted to have some aortic atherosclerosis.  In July 2022 his direct LDL was 93, total cholesterol 139, triglycerides 219 and HDL 31.  He has not been on additional therapy.  He was briefly trialed on ezetimibe but also had similar side effects to the medication.  01/31/2022  Joseph Drake returns today for follow-up.  He had been denied PCSK9 inhibitor by insurance as he was not felt that he had a familial hyperlipidemia as his LDL cholesterol was not over 190.  He does have very high cholesterol however and evidence of coronary artery calcification.  Unfortunately cannot tolerate statins or ezetimibe.  We had recommended Nexletol.  He took this for a short period of time and then developed some diarrhea.  He said after stopping the medicine it went away immediately.  He had not retrialed that.  Options are otherwise limited.  While it is likely that this could be the medicine, it is not a typical side effect.  Is also possible he could have had an infectious diarrhea that was self-limited.  I would advise him to retrial the medicine again and if he has side effects, obviously he would have to stop it.  We then need to consider another attempt at statin therapy, possibly Livalo.  08/18/2022  Joseph Drake is seen today in follow-up.  Although he has had reduction in his cholesterol with  LDL from 156 down to 80 and particle number down to 1547 from 2513 on Pitavastatin, he reports intolerance of the statin as well.  He had similar whole body aches which stopped when he stopped the medicine and then came back when he restarted it.  He also had similar reactions on rosuvastatin and simvastatin as well as ezetimibe.  He is noted to have coronary artery calcification with a calcium score 43, 63rd percentile for age and sex matched control as well as aortic atherosclerosis.  He also has some peripheral arterial disease with findings of abdominal aortic atherosclerosis and branch vessel atherosclerosis seen on CT of the abdomen for hematuria workup back in 2021.  PMHx:  Past Medical History:  Diagnosis Date   Acid reflux    Allergy    BPH (benign prostatic hypertrophy)    Candida infection    Dyspnea    ED (erectile dysfunction)    Genital herpes    History of continuous positive airway pressure (CPAP) therapy    HTN (hypertension)    Hydronephrosis    Hyperlipidemia    Kidney stone on left side    LVH (left ventricular hypertrophy)    Obesity    Palpitation    Holter in May 2014 with NSR, PVC, rare PACs   Palpitation    Sleep apnea    Testosterone deficiency     Past Surgical History:  Procedure Laterality Date   CIRCUMCISION     NASAL SEPTUM SURGERY  FAMHx:  Family History  Problem Relation Age of Onset   Stroke Mother    Hyperlipidemia Mother    Hypertension Mother    Memory loss Mother    Hypertension Father    Hyperlipidemia Father    Colon cancer Paternal Uncle    Prostate cancer Neg Hx    Kidney disease Neg Hx    Allergic rhinitis Neg Hx    Asthma Neg Hx    Kidney cancer Neg Hx    Bladder Cancer Neg Hx     SOCHx:   reports that he has never smoked. He has never used smokeless tobacco. He reports current alcohol use. He reports that he does not use drugs.  ALLERGIES:  Allergies  Allergen Reactions   Niacin And Related Hives   Penicillins      rash   Pitavastatin     Causes body aches   Rosuvastatin     REACTION: myalgia   Simvastatin     Myalgias- happened with vytorin but likely zetia was not the issue.     Zetia [Ezetimibe]     aches    ROS: Pertinent items noted in HPI and remainder of comprehensive ROS otherwise negative.  HOME MEDS: Current Outpatient Medications on File Prior to Visit  Medication Sig Dispense Refill   benazepril (LOTENSIN) 5 MG tablet TAKE 1 TABLET BY MOUTH  DAILY 90 tablet 3   cetirizine (ZYRTEC) 10 MG tablet Take 10 mg by mouth daily.     colchicine 0.6 MG tablet TAKE ONE TABLET BY MOUTH DAILY AS NEEDED FOR GOUT.  Okay to fill with mitigare or colcrys also. 30 tablet 2   escitalopram (LEXAPRO) 10 MG tablet TAKE 1 TABLET BY MOUTH  DAILY 90 tablet 3   fluticasone (FLONASE) 50 MCG/ACT nasal spray Place 2 sprays into both nostrils daily.     omeprazole (PRILOSEC) 20 MG capsule Take 20 mg by mouth daily.     valACYclovir (VALTREX) 500 MG tablet TAKE 1 TABLET BY MOUTH  DAILY 90 tablet 3   Pitavastatin Magnesium (ZYPITAMAG) 4 MG TABS Take 1 tablet by mouth daily. (Patient not taking: Reported on 08/18/2022) 30 tablet 11   No current facility-administered medications on file prior to visit.    LABS/IMAGING: No results found for this or any previous visit (from the past 48 hour(s)). No results found.  LIPID PANEL:    Component Value Date/Time   CHOL 189 06/17/2022 0931   TRIG 222.0 (H) 06/17/2022 0931   TRIG 126 04/15/2006 1015   HDL 35.70 (L) 06/17/2022 0931   CHOLHDL 5 06/17/2022 0931   VLDL 44.4 (H) 06/17/2022 0931   LDLCALC 73 08/12/2019 0740   LDLDIRECT 136.0 06/17/2022 0931    WEIGHTS: Wt Readings from Last 3 Encounters:  08/18/22 240 lb 3.2 oz (109 kg)  06/24/22 235 lb (106.6 kg)  01/31/22 224 lb (101.6 kg)    VITALS: BP 128/82 (BP Location: Right Arm, Patient Position: Sitting, Cuff Size: Normal)   Pulse 67   Ht  (1.727 m)   Wt 240 lb 3.2 oz (109 kg)   SpO2 97%    BMI 36.52 kg/m   EXAM: Deferred  EKG: N/A  ASSESSMENT: Mixed dyslipidemia, goal LDL less than 70 Statin, bempedoic acid and ezetimibe intolerant-myalgias Abnormal CAC score of 43, 63rd percentile for age and sex matched control-aortic atherosclerosis (03/2021) PAD with abdominal aortic and branch vessel atherosclerosis seen by CT (08/2019)  PLAN: 1.   Joseph Drake has atherosclerotic cardiovascular disease with  coronary artery calcification, aortic atherosclerosis and branch vessel atherosclerosis of the abdominal aortic and mesenteric vessels consistent with PAD.  These findings suggest a diffuse atherosclerotic process and unfortunately he has been intolerant now of 3 statins and ezetimibe.  There are few additional options for therapies.  He had previously also tried bempedoic acid but had GI upset with this.  He has few additional options other than PCSK9 inhibitor therapy.  He is agreeable to trying it but had been denied this before due to lack of significant atherosclerotic disease.  Apparently the insurance wants him to wait until he has a documented coronary or cerebrovascular event.  Further review of his chart indicates he does have PAD which is a indication according to the [facility] formulary for PCSK9 inhibitor therapy in the setting of multidrug intolerance.  Will again reach out for prior authorization.  If approved plan repeat lipid NMR in about 3 to 4 months.  His untreated LDL was 156 with a LDL particle number of 2513.  Chrystie Nose, MD, [facility], FACP  [place]  [facility]  Medical Director of the Advanced Lipid Disorders &  Cardiovascular Risk Reduction Clinic Diplomate of the American Board of Clinical Lipidology Attending Cardiologist  Direct Dial: [phone]  Fax: [phone]  Website:  [email or web address] Nicely Sevrin Sally 08/18/2022, 10:14 AM

## 2022-08-19 ENCOUNTER — Other Ambulatory Visit (HOSPITAL_COMMUNITY): Payer: Self-pay

## 2022-08-19 ENCOUNTER — Telehealth: Payer: Self-pay

## 2022-08-19 NOTE — Telephone Encounter (Signed)
Pharmacy Patient Advocate Encounter   Received notification from [facility] that prior authorization for REPATHA is needed.    PA submitted on 08/19/22 VIA PROMPTPA  Status is pending  Haze Rushing, CPhT Pharmacy Patient Advocate Specialist Direct Number: [phone] Fax: [phone]

## 2022-08-19 NOTE — Telephone Encounter (Signed)
Pending, please see separate encounter for determination

## 2022-08-20 MED ORDER — REPATHA SURECLICK 140 MG/ML ~~LOC~~ SOAJ: 140.0000 mg | SUBCUTANEOUS | 3 refills | Status: DC

## 2022-08-20 NOTE — Addendum Note (Signed)
Addended by: Lindell Spar on: 08/20/2022 12:52 PM   Modules accepted: Orders

## 2022-08-20 NOTE — Telephone Encounter (Signed)
Message sent to patient in MyChart w/update on approval

## 2022-08-20 NOTE — Telephone Encounter (Addendum)
Pharmacy Patient Advocate Encounter  Prior Authorization for REPATHA has been approved.    Effective dates: 08/19/22 through 02/18/23  Haze Rushing, CPhT Pharmacy Patient Advocate Specialist Direct Number: [phone] Fax: [phone]

## 2022-10-04 ENCOUNTER — Other Ambulatory Visit: Payer: Self-pay | Admitting: Family Medicine

## 2022-11-19 DIAGNOSIS — R931 Abnormal findings on diagnostic imaging of heart and coronary circulation: Secondary | ICD-10-CM | POA: Diagnosis not present

## 2022-11-19 DIAGNOSIS — E785 Hyperlipidemia, unspecified: Secondary | ICD-10-CM | POA: Diagnosis not present

## 2022-11-20 LAB — NMR, LIPOPROFILE
Cholesterol, Total: 134 mg/dL (ref 100–199)
HDL Particle Number: 28.1 umol/L — ABNORMAL LOW (ref >=30.5)
HDL-C: 33 mg/dL — ABNORMAL LOW (ref >39)
LDL Particle Number: 912 nmol/L (ref <1000)
LDL Size: 19.6 nm — ABNORMAL LOW (ref >20.5)
LDL-C (NIH Calc): 63 mg/dL (ref 0–99)
LP-IR Score: 85 — ABNORMAL HIGH (ref <=45)
Small LDL Particle Number: 791 nmol/L — ABNORMAL HIGH (ref <=527)
Triglycerides: 233 mg/dL — ABNORMAL HIGH (ref 0–149)

## 2022-11-28 ENCOUNTER — Encounter: Payer: Self-pay | Admitting: Internal Medicine

## 2022-11-28 ENCOUNTER — Ambulatory Visit: Payer: BC Managed Care – PPO | Attending: Internal Medicine | Admitting: Internal Medicine

## 2022-11-28 VITALS — BP 128/86 | HR 87 | Ht 68.5 in | Wt 236.2 lb

## 2022-11-28 DIAGNOSIS — I739 Peripheral vascular disease, unspecified: Secondary | ICD-10-CM

## 2022-11-28 DIAGNOSIS — M791 Myalgia, unspecified site: Secondary | ICD-10-CM | POA: Diagnosis not present

## 2022-11-28 DIAGNOSIS — E785 Hyperlipidemia, unspecified: Secondary | ICD-10-CM | POA: Diagnosis not present

## 2022-11-28 DIAGNOSIS — T466X5D Adverse effect of antihyperlipidemic and antiarteriosclerotic drugs, subsequent encounter: Secondary | ICD-10-CM

## 2022-11-28 DIAGNOSIS — R931 Abnormal findings on diagnostic imaging of heart and coronary circulation: Secondary | ICD-10-CM

## 2022-11-28 NOTE — Patient Instructions (Signed)
Medication Instructions:  NO CHANGES  *If you need a refill on your cardiac medications before your next appointment, please call your pharmacy*   Lab Work: FASTING lab work to check cholesterol in 1 year   If you have labs (blood work) drawn today and your tests are completely normal, you will receive your results only by: MyChart Message (if you have MyChart) OR A paper copy in the mail If you have any lab test that is abnormal or we need to change your treatment, we will call you to review the results.    Follow-Up: At [facility], you and your health needs are our priority.  As part of our continuing mission to provide you with exceptional heart care, we have created designated Provider Care Teams.  These Care Teams include your primary Cardiologist (physician) and Advanced Practice Providers (APPs -  Physician Assistants and Nurse Practitioners) who all work together to provide you with the care you need, when you need it.  We recommend signing up for the patient portal called "MyChart".  Sign up information is provided on this After Visit Summary.  MyChart is used to connect with patients for Virtual Visits (Telemedicine).  Patients are able to view lab/test results, encounter notes, upcoming appointments, etc.  Non-urgent messages can be sent to your provider as well.   To learn more about what you can do with MyChart, go to [email or web address].    Your next appointment:    12 months with Dr. Hilty  

## 2022-11-28 NOTE — Progress Notes (Signed)
LIPID CLINIC CONSULT NOTE  Chief Complaint:  Follow-up dyslipidemia  Primary Care Physician: Joaquim Nam, MD  Primary Cardiologist:  None  HPI:  Joseph Drake is a 62 y.o. male who is being seen today for the evaluation of dyslipidemia at the request of Para March, Dwana Curd, MD. this is a pleasant 62 year old male kindly referred for evaluation management of dyslipidemia.  He has a history of statin intolerance in the past and elevated cholesterol.  In December 2022 he had calcium scoring which was moderately elevated showing a calcium score 43, 63rd percentile for age and sex matched controls.  He was also noted to have some aortic atherosclerosis.  In July 2022 his direct LDL was 93, total cholesterol 139, triglycerides 219 and HDL 31.  He has not been on additional therapy.  He was briefly trialed on ezetimibe but also had similar side effects to the medication.  01/31/2022  Joseph Drake returns today for follow-up.  He had been denied PCSK9 inhibitor by insurance as he was not felt that he had a familial hyperlipidemia as his LDL cholesterol was not over 190.  He does have very high cholesterol however and evidence of coronary artery calcification.  Unfortunately cannot tolerate statins or ezetimibe.  We had recommended Nexletol.  He took this for a short period of time and then developed some diarrhea.  He said after stopping the medicine it went away immediately.  He had not retrialed that.  Options are otherwise limited.  While it is likely that this could be the medicine, it is not a typical side effect.  Is also possible he could have had an infectious diarrhea that was self-limited.  I would advise him to retrial the medicine again and if he has side effects, obviously he would have to stop it.  We then need to consider another attempt at statin therapy, possibly Livalo.  08/18/2022  Joseph Drake is seen today in follow-up.  Although he has had reduction in his cholesterol with LDL  from 156 down to 80 and particle number down to 1547 from 2513 on Pitavastatin, he reports intolerance of the statin as well.  He had similar whole body aches which stopped when he stopped the medicine and then came back when he restarted it.  He also had similar reactions on rosuvastatin and simvastatin as well as ezetimibe.  He is noted to have coronary artery calcification with a calcium score 43, 63rd percentile for age and sex matched control as well as aortic atherosclerosis.  He also has some peripheral arterial disease with findings of abdominal aortic atherosclerosis and branch vessel atherosclerosis seen on CT of the abdomen for hematuria workup back in 2021.  11/28/2022  Joseph Drake returns today for follow-up.  He had an excellent response to Repatha.  This is in addition to Zyptimag.  His LDL particle number is down now to 912, LDL-C of 63, triglycerides 233 and small LDL particle number of 791.  He seems to be tolerating this therapy well.  PMHx:  Past Medical History:  Diagnosis Date   Acid reflux    Allergy    BPH (benign prostatic hypertrophy)    Candida infection    Dyspnea    ED (erectile dysfunction)    Genital herpes    History of continuous positive airway pressure (CPAP) therapy    HTN (hypertension)    Hydronephrosis    Hyperlipidemia    Kidney stone on left side    LVH (left ventricular hypertrophy)  Obesity    Palpitation    Holter in May 2014 with NSR, PVC, rare PACs   Palpitation    Sleep apnea    Testosterone deficiency     Past Surgical History:  Procedure Laterality Date   CIRCUMCISION     NASAL SEPTUM SURGERY      FAMHx:  Family History  Problem Relation Age of Onset   Stroke Mother    Hyperlipidemia Mother    Hypertension Mother    Memory loss Mother    Hypertension Father    Hyperlipidemia Father    Colon cancer Paternal Uncle    Prostate cancer Neg Hx    Kidney disease Neg Hx    Allergic rhinitis Neg Hx    Asthma Neg Hx    Kidney  cancer Neg Hx    Bladder Cancer Neg Hx     SOCHx:   reports that he has never smoked. He has never used smokeless tobacco. He reports current alcohol use. He reports that he does not use drugs.  ALLERGIES:  Allergies  Allergen Reactions   Nexletol [Bempedoic Acid]     GI upset   Niacin And Related Hives   Penicillins     rash   Pitavastatin     Causes body aches   Rosuvastatin     REACTION: myalgia   Simvastatin     Myalgias- happened with vytorin but likely zetia was not the issue.     Zetia [Ezetimibe]     aches    ROS: Pertinent items noted in HPI and remainder of comprehensive ROS otherwise negative.  HOME MEDS: Current Outpatient Medications on File Prior to Visit  Medication Sig Dispense Refill   benazepril (LOTENSIN) 5 MG tablet TAKE 1 TABLET BY MOUTH DAILY 90 tablet 3   cetirizine (ZYRTEC) 10 MG tablet Take 10 mg by mouth daily.     colchicine 0.6 MG tablet TAKE ONE TABLET BY MOUTH DAILY AS NEEDED FOR GOUT.  Okay to fill with mitigare or colcrys also. 30 tablet 2   escitalopram (LEXAPRO) 10 MG tablet TAKE 1 TABLET BY MOUTH DAILY 90 tablet 3   Evolocumab (REPATHA SURECLICK) 140 MG/ML SOAJ Inject 140 mg into the skin every 14 (fourteen) days. 6 mL 3   fluticasone (FLONASE) 50 MCG/ACT nasal spray Place 2 sprays into both nostrils daily.     omeprazole (PRILOSEC) 20 MG capsule Take 20 mg by mouth daily.     valACYclovir (VALTREX) 500 MG tablet TAKE 1 TABLET BY MOUTH DAILY 90 tablet 3   Pitavastatin Magnesium (ZYPITAMAG) 4 MG TABS Take 1 tablet by mouth daily. (Patient not taking: Reported on 11/28/2022) 30 tablet 11   No current facility-administered medications on file prior to visit.    LABS/IMAGING: No results found for this or any previous visit (from the past 48 hour(s)). No results found.  LIPID PANEL:    Component Value Date/Time   CHOL 189 06/17/2022 0931   TRIG 222.0 (H) 06/17/2022 0931   TRIG 126 04/15/2006 1015   HDL 35.70 (L) 06/17/2022 0931    CHOLHDL 5 06/17/2022 0931   VLDL 44.4 (H) 06/17/2022 0931   LDLCALC 73 08/12/2019 0740   LDLDIRECT 136.0 06/17/2022 0931    WEIGHTS: Wt Readings from Last 3 Encounters:  11/28/22 236 lb 3.2 oz (107.1 kg)  08/18/22 240 lb 3.2 oz (109 kg)  06/24/22 235 lb (106.6 kg)    VITALS: BP 128/86 (BP Location: Left Arm, Patient Position: Sitting, Cuff Size: Normal)   Pulse  87   Ht 5' 8.5" (1.74 m)   Wt 236 lb 3.2 oz (107.1 kg)   SpO2 95%   BMI 35.39 kg/m   EXAM: Deferred  EKG: N/A  ASSESSMENT: Mixed dyslipidemia, goal LDL less than 70 Statin, bempedoic acid and ezetimibe intolerant-myalgias Abnormal CAC score of 43, 63rd percentile for age and sex matched control-aortic atherosclerosis (03/2021) PAD with abdominal aortic and branch vessel atherosclerosis seen by CT (08/2019)  PLAN: 1.   Joseph Drake has had significant additional reduction in his lipids with Repatha.  LDL particle number down to 912, however it was as high as 2640 in the past.  His LDL-C is now 63, again down from as high as 179.  Overall an excellent response to therapy.  I recommend we continue this.  Will plan repeat lipids annually and follow-up at that time.  Chrystie Nose, MD, [facility], FACP  [place]  [facility]  Medical Director of the Advanced Lipid Disorders &  Cardiovascular Risk Reduction Clinic Diplomate of the American Board of Clinical Lipidology Attending Cardiologist  Direct Dial: [phone]  Fax: [phone]  Website:  [email or web address]  11/28/2022, 4:06 PM

## 2023-01-26 ENCOUNTER — Telehealth: Payer: Self-pay

## 2023-01-26 ENCOUNTER — Other Ambulatory Visit (HOSPITAL_COMMUNITY): Payer: Self-pay

## 2023-01-26 NOTE — Telephone Encounter (Signed)
Pharmacy Patient Advocate Encounter   Received notification from Physician's Office that prior authorization for REPATHA is required/requested.   Insurance verification completed.   The patient is insured through [facility] .   Per test claim: PA required; PA submitted to [facility] via Prompt PA Key/confirmation #/[id number] Status is pending

## 2023-01-26 NOTE — Telephone Encounter (Signed)
Pharmacy Patient Advocate Encounter  Received notification from [employer] that Prior Authorization for REPATHA has been APPROVED from 01/26/23 to 01/25/24. Ran test claim, Copay is $24.99. This test claim was processed through [facility]- copay amounts may vary at other pharmacies due to pharmacy/plan contracts, or as the patient moves through the different stages of their insurance plan.

## 2023-01-27 ENCOUNTER — Other Ambulatory Visit: Payer: Self-pay | Admitting: Internal Medicine

## 2023-01-27 NOTE — Telephone Encounter (Signed)
PA request has been Approved. New Encounter created for follow up. For additional info see Pharmacy Prior Auth telephone encounter from 01/26/23.

## 2023-02-04 DIAGNOSIS — M7711 Lateral epicondylitis, right elbow: Secondary | ICD-10-CM | POA: Diagnosis not present

## 2023-02-06 ENCOUNTER — Encounter: Payer: Self-pay | Admitting: Podiatry

## 2023-02-06 ENCOUNTER — Ambulatory Visit: Payer: BC Managed Care – PPO | Admitting: Podiatry

## 2023-02-06 DIAGNOSIS — M778 Other enthesopathies, not elsewhere classified: Secondary | ICD-10-CM

## 2023-02-06 MED ORDER — TRIAMCINOLONE ACETONIDE 10 MG/ML IJ SUSP
10.0000 mg | Freq: Once | INTRAMUSCULAR | Status: AC
Start: 2023-02-06 — End: 2023-02-06
  Administered 2023-02-06: 10 mg via INTRA_ARTICULAR

## 2023-02-08 NOTE — Progress Notes (Signed)
Subjective:   Patient ID: Joseph Drake, male   DOB: 62 y.o.   MRN: [id number]   HPI Patient presents stating has inflammation of the extensor tendons of both feet painful stating he got great relief at last visit   ROS      Objective:  Physical Exam  Neuro vascular status intact inflammation pain of the extensor tendon group bilateral fluid buildup around the tendons     Assessment:  Chronic extensor tendinitis bilateral with pain     Plan:  H&P reviewed continue conservative sterile prep injected the extensor group bilateral 3 mg dexamethasone Kenalog 5 mg Xylocaine after explaining risk reappoint as needed

## 2023-02-09 DIAGNOSIS — M25562 Pain in left knee: Secondary | ICD-10-CM | POA: Diagnosis not present

## 2023-07-02 ENCOUNTER — Encounter: Payer: Self-pay | Admitting: Family

## 2023-07-02 ENCOUNTER — Ambulatory Visit: Admitting: Family

## 2023-07-02 VITALS — BP 134/82 | HR 85 | Temp 98.0°F | Ht 69.0 in | Wt 237.2 lb

## 2023-07-02 DIAGNOSIS — J301 Allergic rhinitis due to pollen: Secondary | ICD-10-CM

## 2023-07-02 DIAGNOSIS — J069 Acute upper respiratory infection, unspecified: Secondary | ICD-10-CM

## 2023-07-02 DIAGNOSIS — R051 Acute cough: Secondary | ICD-10-CM | POA: Diagnosis not present

## 2023-07-02 LAB — POC COVID19 BINAXNOW: SARS Coronavirus 2 Ag: NEGATIVE

## 2023-07-02 MED ORDER — PREDNISONE 10 MG (21) PO TBPK
ORAL_TABLET | ORAL | 0 refills | Status: DC
Start: 2023-07-02 — End: 2023-09-04

## 2023-07-02 NOTE — Progress Notes (Addendum)
 Established Patient Office Visit  Subjective:   Patient ID: Joseph Drake, male    DOB: 1961-01-02  Age: 63 y.o. MRN: [id number]  CC:  Chief Complaint  Patient presents with   Acute Visit    Reports cough and nasal congestion x4 days. COVID test at home was negative.    HPI: Joseph Drake is a 63 y.o. male presenting on 07/02/2023 for Acute Visit (Reports cough and nasal congestion x4 days. COVID test at home was negative.)  Five days ago started with nasal congestion that has since progressed to his chest. He has chest tightness and congestion, and some mild sore throat but thinks this might be from the coughing. Mostj of his mucous is clear every so often yellow sputum. No fever that he knows of, but he is taking otc cold and flu medications with tylenol in it and then alternating meds with ibuprofen. Is taking mucinex as well.  He has had some body aches and chills intermittently. Symptoms are very slightly better today but states he has a lot of fatigue, just wants to lie around. He is not wheezing, he does not have sob. Yesterday was coughing a lot and not as much yet today.   Covid tested at home and was negative.   Is taking otc antihistamine as well.      ROS: Negative unless specifically indicated above in HPI.   Relevant past medical history reviewed and updated as indicated.   Allergies and medications reviewed and updated.   Current Outpatient Medications:    benazepril (LOTENSIN) 5 MG tablet, TAKE 1 TABLET BY MOUTH DAILY, Disp: 90 tablet, Rfl: 3   cetirizine (ZYRTEC) 10 MG tablet, Take 10 mg by mouth daily., Disp: , Rfl:    colchicine 0.6 MG tablet, TAKE ONE TABLET BY MOUTH DAILY AS NEEDED FOR GOUT.  Okay to fill with mitigare or colcrys also., Disp: 30 tablet, Rfl: 2   escitalopram (LEXAPRO) 10 MG tablet, TAKE 1 TABLET BY MOUTH DAILY, Disp: 90 tablet, Rfl: 3   Evolocumab (REPATHA SURECLICK) 140 MG/ML SOAJ, INJECT 140 MG INTO THE SKIN EVERY 14 (FOURTEEN) DAYS.,  Disp: 6 mL, Rfl: 3   fluticasone (FLONASE) 50 MCG/ACT nasal spray, Place 2 sprays into both nostrils daily., Disp: , Rfl:    omeprazole (PRILOSEC) 20 MG capsule, Take 20 mg by mouth daily., Disp: , Rfl:    Pitavastatin Magnesium (ZYPITAMAG) 4 MG TABS, Take 1 tablet by mouth daily., Disp: 30 tablet, Rfl: 11   predniSONE (STERAPRED UNI-PAK 21 TAB) 10 MG (21) TBPK tablet, Take as directed, Disp: 1 each, Rfl: 0   valACYclovir (VALTREX) 500 MG tablet, TAKE 1 TABLET BY MOUTH DAILY, Disp: 90 tablet, Rfl: 3  Allergies  Allergen Reactions   Nexletol [Bempedoic Acid]     GI upset   Niacin And Related Hives   Penicillins     rash   Pitavastatin     Causes body aches   Rosuvastatin     REACTION: myalgia   Simvastatin     Myalgias- happened with vytorin but likely zetia was not the issue.     Zetia [Ezetimibe]     aches    Objective:   BP 134/82 (BP Location: Left Arm, Patient Position: Sitting, Cuff Size: Normal)   Pulse 85   Temp 98 F (36.7 C) (Temporal)   Ht 5\' 9"  (1.753 m)   Wt 237 lb 3.2 oz (107.6 kg)   SpO2 98%   BMI 35.03 kg/m    Physical  Exam Vitals reviewed.  Constitutional:      General: He is not in acute distress.    Appearance: Normal appearance. He is obese. He is not ill-appearing, toxic-appearing or diaphoretic.  HENT:     Head: Normocephalic.     Right Ear: Tympanic membrane normal.     Left Ear: Tympanic membrane normal.     Nose: Nose normal.     Mouth/Throat:     Mouth: Mucous membranes are moist.  Eyes:     Pupils: Pupils are equal, round, and reactive to light.  Cardiovascular:     Rate and Rhythm: Normal rate and regular rhythm.  Pulmonary:     Effort: Pulmonary effort is normal.     Breath sounds: Normal breath sounds. No wheezing.  Musculoskeletal:        General: Normal range of motion.     Cervical back: Normal range of motion.  Neurological:     General: No focal deficit present.     Mental Status: He is alert and oriented to person, place,  and time. Mental status is at baseline.  Psychiatric:        Mood and Affect: Mood normal.        Behavior: Behavior normal.        Thought Content: Thought content normal.        Judgment: Judgment normal.     Assessment & Plan:  Acute cough -     predniSONE; Take as directed  Dispense: 1 each; Refill: 0 -     POC COVID-19 BinaxNow  Viral upper respiratory infection Assessment & Plan: Rx prednisone pack take as directed   Pt to continue tylenol prn sinus pain. Continue with humidifier prn and steam showers recommended as well. instructed If no symptom improvement in 48 hours please f/u   Orders: -     predniSONE; Take as directed  Dispense: 1 each; Refill: 0  Non-seasonal allergic rhinitis due to pollen Assessment & Plan: Continue with antihistamine   Covid was negative in office   Follow up plan: Return if symptoms worsen or fail to improve.  Mort Sawyers, FNP

## 2023-07-02 NOTE — Assessment & Plan Note (Addendum)
 Rx prednisone pack take as directed   Pt to continue tylenol prn sinus pain. Continue with humidifier prn and steam showers recommended as well. instructed If no symptom improvement in 48 hours please f/u

## 2023-07-02 NOTE — Assessment & Plan Note (Signed)
Continue with antihistamine. °

## 2023-09-04 ENCOUNTER — Ambulatory Visit: Admitting: Urology

## 2023-09-04 VITALS — BP 131/88 | HR 101 | Ht 69.0 in | Wt 227.0 lb

## 2023-09-04 DIAGNOSIS — N401 Enlarged prostate with lower urinary tract symptoms: Secondary | ICD-10-CM | POA: Diagnosis not present

## 2023-09-04 DIAGNOSIS — M6289 Other specified disorders of muscle: Secondary | ICD-10-CM

## 2023-09-04 DIAGNOSIS — N138 Other obstructive and reflux uropathy: Secondary | ICD-10-CM

## 2023-09-04 LAB — URINALYSIS, COMPLETE
Bilirubin, UA: NEGATIVE
Glucose, UA: NEGATIVE
Ketones, UA: NEGATIVE
Leukocytes,UA: NEGATIVE
Nitrite, UA: NEGATIVE
Protein,UA: NEGATIVE
RBC, UA: NEGATIVE
Specific Gravity, UA: 1.03 (ref 1.005–1.030)
Urobilinogen, Ur: 1 mg/dL (ref 0.2–1.0)
pH, UA: 6 (ref 5.0–7.5)

## 2023-09-04 LAB — MICROSCOPIC EXAMINATION

## 2023-09-04 LAB — BLADDER SCAN AMB NON-IMAGING: Scan Result: 0

## 2023-09-04 MED ORDER — CYCLOBENZAPRINE HCL 10 MG PO TABS: 10.0000 mg | ORAL_TABLET | Freq: Three times a day (TID) | ORAL | 0 refills | Status: AC | PRN

## 2023-09-07 ENCOUNTER — Encounter: Payer: Self-pay | Admitting: Urology

## 2023-09-07 ENCOUNTER — Telehealth: Payer: Self-pay | Admitting: Urology

## 2023-09-07 NOTE — Telephone Encounter (Signed)
 Spoke with Joseph Drake and he weekend went okay with the Flexeril.  He is going to try it again this weekend.  He will also reach out to the PT department to see about an appointment

## 2023-09-07 NOTE — Progress Notes (Signed)
 09/04/2023 4:52 PM   Joseph Drake 1960/12/27 [id number]  Referring provider: Donnie Galea, MD [address] [place],  [place] [address]  Urological history: 1. BPH with LU TS - PSA (05/2022) 0.63 - cysto (2021) - enlarged prostate w/ bilobar coaptation  2.  Ejaculatory disorder  3.  Erectile dysfunction  4.  Pelvic floor dysfunction  5. High risk hematuria - non smoker - CTU (2021) - mild prostatomegaly - cysto (2021) - BPH  Chief Complaint  Patient presents with   Follow-up   HPI: Joseph Drake is a 63 y.o. man who presents today for urinary issues.   Previous records reviewed.   UA yellow clear, specific gravity greater than 1.030, pH 6.0, 0-5 WBCs, 0-2 RBCs, 0-10 epithelial cells, mucus at present and few bacteria.  PVR 0 mL   I PSS 15/3  His concern today is that he may be flying to [place] for work in the next month or 2 and that with his urinary issues, the flight may cause issues.  He states that when he delays urination after he gets the signal that he needs to void, he almost feels like his sphincter muscle will not relax and he is not able to release the urine.  This becomes very uncomfortable and he does not want to have the situation occur on a international flight.  He did have pelvic floor physical therapy which worked great for him and he would like to be referred back to PT.  Patient denies any modifying or aggravating factors.  Patient denies any recent UTI's, gross hematuria, dysuria or suprapubic/flank pain.  Patient denies any fevers, chills, nausea or vomiting.     IPSS     Row Name 09/04/23 0800         International Prostate Symptom Score   How often have you had the sensation of not emptying your bladder? Less than half the time     How often have you had to urinate less than every two hours? About half the time     How often have you found you stopped and started again several times when you urinated? About half the time      How often have you found it difficult to postpone urination? Less than 1 in 5 times     How often have you had a weak urinary stream? Almost always     How often have you had to strain to start urination? Not at All     How many times did you typically get up at night to urinate? 1 Time     Total IPSS Score 15       Quality of Life due to urinary symptoms   If you were to spend the rest of your life with your urinary condition just the way it is now how would you feel about that? Mixed              Score:  1-7 Mild 8-19 Moderate 20-35 Severe   PMH: Past Medical History:  Diagnosis Date   Acid reflux    Allergy    BPH (benign prostatic hypertrophy)    Candida infection    Dyspnea    ED (erectile dysfunction)    Genital herpes    History of continuous positive airway pressure (CPAP) therapy    HTN (hypertension)    Hydronephrosis    Hyperlipidemia    Kidney stone on left side    LVH (left ventricular hypertrophy)  Obesity    Palpitation    Holter in May 2014 with NSR, PVC, rare PACs   Palpitation    Sleep apnea    Testosterone  deficiency     Surgical History: Past Surgical History:  Procedure Laterality Date   CIRCUMCISION     NASAL SEPTUM SURGERY      Home Medications:  Allergies as of 09/04/2023       Reactions   Nexletol  [bempedoic Acid ]    GI upset   Niacin Itching   Niacin And Related Hives   Penicillins    rash   Pitavastatin     Causes body aches   Rosuvastatin    REACTION: myalgia   Simvastatin     Myalgias- happened with vytorin  but likely zetia  was not the issue.     Zetia  [ezetimibe ]    aches        Medication List        Accurate as of Sep 04, 2023 11:59 PM. If you have any questions, ask your nurse or doctor.          STOP taking these medications    predniSONE  10 MG (21) Tbpk tablet Commonly known as: STERAPRED UNI-PAK 21 TAB   Zypitamag  4 MG Tabs Generic drug: Pitavastatin  Magnesium       TAKE these  medications    benazepril  5 MG tablet Commonly known as: LOTENSIN  TAKE 1 TABLET BY MOUTH DAILY   cetirizine 10 MG tablet Commonly known as: ZYRTEC Take 10 mg by mouth daily.   colchicine  0.6 MG tablet TAKE ONE TABLET BY MOUTH DAILY AS NEEDED FOR GOUT.  Okay to fill with mitigare  or colcrys  also.   cyclobenzaprine 10 MG tablet Commonly known as: FLEXERIL Take 1 tablet (10 mg total) by mouth 3 (three) times daily as needed for muscle spasms.   escitalopram  10 MG tablet Commonly known as: LEXAPRO  TAKE 1 TABLET BY MOUTH DAILY   fluticasone 50 MCG/ACT nasal spray Commonly known as: FLONASE Place 2 sprays into both nostrils daily.   omeprazole  20 MG capsule Commonly known as: PRILOSEC Take 20 mg by mouth daily.   Repatha  SureClick 140 MG/ML Soaj Generic drug: Evolocumab  INJECT 140 MG INTO THE SKIN EVERY 14 (FOURTEEN) DAYS.   valACYclovir  500 MG tablet Commonly known as: VALTREX  TAKE 1 TABLET BY MOUTH DAILY        Allergies:  Allergies  Allergen Reactions   Nexletol  [Bempedoic Acid ]     GI upset   Niacin Itching   Niacin And Related Hives   Penicillins     rash   Pitavastatin      Causes body aches   Rosuvastatin     REACTION: myalgia   Simvastatin      Myalgias- happened with vytorin  but likely zetia  was not the issue.     Zetia  [Ezetimibe ]     aches    Family History: Family History  Problem Relation Age of Onset   Stroke Mother    Hyperlipidemia Mother    Hypertension Mother    Memory loss Mother    Hypertension Father    Hyperlipidemia Father    Colon cancer Paternal Uncle    Prostate cancer Neg Hx    Kidney disease Neg Hx    Allergic rhinitis Neg Hx    Asthma Neg Hx    Kidney cancer Neg Hx    Bladder Cancer Neg Hx     Social History:  reports that he has never smoked. He has never used smokeless tobacco. He reports current  alcohol use. He reports that he does not use drugs.  ROS: Pertinent ROS in HPI  Physical Exam: BP 131/88   Pulse  (!) 101   Ht 5\' 9"  (1.753 m)   Wt 227 lb (103 kg)   BMI 33.52 kg/m   Constitutional:  Well nourished. Alert and oriented, No acute distress. HEENT: Ramirez-Perez AT, moist mucus membranes.  Trachea midline Cardiovascular: No clubbing, cyanosis, or edema. Respiratory: Normal respiratory effort, no increased work of breathing. Neurologic: Grossly intact, no focal deficits, moving all 4 extremities. Psychiatric: Normal mood and affect.  Laboratory Data:  Lab Results  Component Value Date   CREATININE 1.21 06/17/2022    Lab Results  Component Value Date   PSA 0.63 06/17/2022   PSA 0.59 11/13/2020   PSA 0.15 08/10/2017   Lab Results  Component Value Date   HGBA1C 6.3 06/17/2022      Component Value Date/Time   CHOL 189 06/17/2022 0931   HDL 35.70 (L) 06/17/2022 0931   CHOLHDL 5 06/17/2022 0931   VLDL 44.4 (H) 06/17/2022 0931   LDLCALC 73 08/12/2019 0740    Lab Results  Component Value Date   AST 23 06/17/2022   Lab Results  Component Value Date   ALT 35 06/17/2022   Urinalysis See EPIC and HPI  I have reviewed the labs.   Pertinent Imaging:  09/04/23 08:58  Scan Result 0 ml  I have independently reviewed the films.    Assessment & Plan:    1. Pelvic floor dysfunction - I have reached out to Dr. Modesto Andreas via secure chat to see she could work him in as the PT department does not have appointments available until August - I have also sent in a script for Flexeril to see if it will relax his sphincter enough to void spontaneously  - he is going to try to recreate some scenarios over the weekend to see if the Flexeril is effective   - we also discussed being instructed in self cath technique as a back up, but he opted out of this  2. BPH with obstruction/lower urinary tract symptoms (Primary) - BLADDER SCAN AMB NON-IMAGING demonstrates adequate emptying  - Urinalysis, Complete, benign, no sign of infection or hematuria   Return for will reach out on Monday to  see how the weekend went with the Flexeril .  These notes generated with voice recognition software. I apologize for typographical errors.  Briant Camper  [facility] [address]  [address] [place], [place] [address] [phone]

## 2023-09-08 ENCOUNTER — Other Ambulatory Visit: Payer: Self-pay | Admitting: Urology

## 2023-09-08 DIAGNOSIS — M6289 Other specified disorders of muscle: Secondary | ICD-10-CM

## 2023-09-10 ENCOUNTER — Ambulatory Visit: Attending: Urology | Admitting: Physical Therapy

## 2023-09-10 ENCOUNTER — Encounter: Payer: Self-pay | Admitting: Physical Therapy

## 2023-09-10 DIAGNOSIS — R278 Other lack of coordination: Secondary | ICD-10-CM | POA: Insufficient documentation

## 2023-09-10 DIAGNOSIS — M533 Sacrococcygeal disorders, not elsewhere classified: Secondary | ICD-10-CM | POA: Insufficient documentation

## 2023-09-10 DIAGNOSIS — R2689 Other abnormalities of gait and mobility: Secondary | ICD-10-CM | POA: Insufficient documentation

## 2023-09-10 DIAGNOSIS — M6289 Other specified disorders of muscle: Secondary | ICD-10-CM | POA: Insufficient documentation

## 2023-09-10 NOTE — Therapy (Signed)
 OUTPATIENT PHYSICAL THERAPY EVALUATION   Patient Name: Joseph Drake MRN: [id number] DOB:04-05-1961, 63 y.o., male Today's Date: 09/10/2023   PT End of Session - 09/10/23 1024     Visit Number 1    Number of Visits 10    Date for PT Re-Evaluation 11/19/23    PT Start Time 1020    PT Stop Time 1100    PT Time Calculation (min) 40 min    Activity Tolerance Patient tolerated treatment well    Behavior During Therapy WFL for tasks assessed/performed             Past Medical History:  Diagnosis Date   Acid reflux    Allergy    BPH (benign prostatic hypertrophy)    Candida infection    Dyspnea    ED (erectile dysfunction)    Genital herpes    History of continuous positive airway pressure (CPAP) therapy    HTN (hypertension)    Hydronephrosis    Hyperlipidemia    Kidney stone on left side    LVH (left ventricular hypertrophy)    Obesity    Palpitation    Holter in May 2014 with NSR, PVC, rare PACs   Palpitation    Sleep apnea    Testosterone  deficiency    Past Surgical History:  Procedure Laterality Date   CIRCUMCISION     NASAL SEPTUM SURGERY     Patient Active Problem List   Diagnosis Date Noted   Neck pain 06/25/2022   Tinnitus of both ears 12/26/2020   Change in hearing 11/18/2020   Anxiety 09/17/2016   GERD (gastroesophageal reflux disease) 08/10/2016   Rosacea 08/10/2016   Laryngopharyngeal reflux (LPR) 06/09/2016   Hypertrophy, nasal, turbinate 05/01/2016   Gout 01/16/2016   BPH with obstruction/lower urinary tract symptoms 09/21/2015   Erectile dysfunction of organic origin 09/21/2015   Knee pain 07/09/2015   Essential hypertension 04/23/2010   Obstructive sleep apnea 03/13/2010   VENTRICULAR HYPERTROPHY, LEFT 03/13/2010   Genital herpes 11/27/2008   HYPERSOMNIA, ASSOCIATED WITH SLEEP APNEA 08/29/2008   Allergic rhinitis 06/27/2008   TESTOSTERONE  DEFICIENCY 01/28/2008   HLD (hyperlipidemia) 09/23/2006    PCP: Vallarie Gauze  REFERRING PROVIDER:  Ledon Pry  REFERRING DIAG: pelvic floor dysfunction  Rationale for Evaluation and Treatment Rehabilitation  THERAPY DIAG:  Sacrococcygeal disorders, not elsewhere classified  Other abnormalities of gait and mobility  Other lack of coordination  ONSET DATE:   SUBJECTIVE:           SUBJECTIVE STATEMENT  SUBJECTIVE STATEMENT ON EVAL 09/10/23 : Pt is not able to relax mm when he is on plane or bus.Pt has to wait too long and then the mm get tight and he is not able to relax them to go.  Pt complete Pelvic PT in 2019 and the exercises and Tx helped.Pt only travelled by bus during the PT Tx but did not travel by plane.   Pt continued with PT exercises after being discharged. Pt was not getting up to pee in the middle of night. Pt continue t wear CPAP machine for OSA. Pt stopped doing exerises because things got better. Pt started to do exercises again recently. Pt has a trip planned to fly to [place] late June. July. Pt does not want to have tight pelvic floor mm because he has issues going to the bathroom when travelling on planes and buses. Pt wonders if it is the motion of using the restroom on planes. Pt sits to use the bathroom. Pt flew to [place] in 2022 and had this issue. Pt waited to urinate when the plane landed. The flow was slow and he could not fully empty. Pt would urinate a little bit and then walk around and return to the toilet and go again.   Pt notices his pelvic floor are trying to relax while still staying restricted. They are straining with mm burning sensation.     PERTINENT HISTORY:  Pt found pelvic floor PT helped int he past.   PAIN:  Are you having pain? No   PRECAUTIONS: None  WEIGHT BEARING RESTRICTIONS: No  FALLS:  Has patient fallen in last 6 months? No  LIVING ENVIRONMENT: Lives  with: with wife  Lives in: one story  Stairs: 4 STE with rail    OCCUPATION:  desk job   PLOF: IND  PATIENT GOALS: Improve mm relaxation in pelvic floor when peeing    OBJECTIVE:   [facility] PT Assessment - 09/10/23 1040       Observation/Other Assessments   Observations ortthotics in B shoe , stiff      Palpation   SI assessment  R shoulder , R iliac crest lowered standing and in supine, ( post Tx with shoe lift in R shoe toe box and heel,  levelled pelvis and shoudler )    Palpation comment hypmolbile T/ L junction,      Ambulation/Gait   Gait Comments 1.3 m/s with decreased stance on R  ( Post Tx: with shoel lift in R toe box and heel  :   1. 58 m./s , reciprocal gait ) ,             [facility] Adult PT Treatment/Exercise - 09/10/23 1040       Self-Care   Self-Care Other Self-Care Comments    Other Self-Care Comments  fitted R shoe with shoe lift in R shoe toe box and heel, levelled pelvis and shoudler      Neuro Re-ed    Neuro Re-ed Details  cued for stretches               HOME EXERCISE PROGRAM: See pt instruction section    ASSESSMENT:  CLINICAL IMPRESSION:  Pt is a  63 yo  who presents with  following urinary  issues which impact QOL, ADL,  travel and community activities:   Burning with urination Incomplete emptying Slowed urine stream   Pt's musculoskeletal assessment revealed uneven pelvic girdle and shoulder height, asymmetries to gait pattern, limited spinal /pelvic mobility, dyscoordination  and strength of pelvic floor mm, hip weakness, poor body mechanics which places strain on the abdominal/pelvic floor mm. These are deficits that indicate an ineffective intraabdominal pressure system associated with increased risk for pt's Sx.    Pt was provided education on etiology of Sx with anatomy, physiology explanation with images along with the benefits of customized pelvic PT Tx based on pt's medical conditions and musculoskeletal deficits.  Explained  the physiology of deep core mm coordination and roles of pelvic floor function in urination, defecation, sexual function, and postural control with deep core mm system, and the role of posture and alignment to help pelvic issues.    Regional interdependent approaches will yield greater benefits in pt's POC due to assessment of leg length difference .  Today, fitted R shoe with shoe lift in R shoe toe box and heel after which pt showed levelled pelvis and shoulder and more reciprocal gait pattern and increased gait speed.  Plan to address realignment of spine/ pelvis at next session to help promote optimize IAP system for improved pelvic floor function, trunk stability, gait, balance, stabilization with mobility tasks.  Plan to address pelvic floor issues once pelvis and spine are realigned to yield better outcomes.     Pt benefits from skilled PT.    OBJECTIVE IMPAIRMENTS decreased activity tolerance, decreased coordination, decreased endurance, decreased mobility, difficulty walking, decreased ROM, decreased strength, decreased safety awareness, hypomobility, increased muscle spasms, impaired flexibility, improper body mechanics, postural dysfunction   ACTIVITY LIMITATIONS  self-care,   home chores, travelling    PARTICIPATION LIMITATIONS:  community, traveling activities    PERSONAL FACTORS        are also affecting patient's functional outcome.    REHAB POTENTIAL: Good   CLINICAL DECISION MAKING: Evolving/moderate complexity   EVALUATION COMPLEXITY: Moderate    PATIENT EDUCATION:    Education details: Showed pt anatomy images. Explained muscles attachments/ connection, physiology of deep core system/ spinal- thoracic-pelvis-lower kinetic chain as they relate to pt's presentation, Sx, and past Hx. Explained what and how these areas of deficits need to be restored to balance and function    See Therapeutic activity / neuromuscular re-education section  Answered pt's questions.    Person educated: Patient Education method: Explanation, Demonstration, Tactile cues, Verbal cues, and Handouts Education comprehension: verbalized understanding, returned demonstration, verbal cues required, tactile cues required, and needs further education     PLAN: PT FREQUENCY: 1x/week   PT DURATION: 10 weeks   PLANNED INTERVENTIONS:   Gait training;Stair training;Functional mobility training;DME Instruction;Therapeutic activities;Therapeutic exercise;Balance training;Neuromuscular re-education;Patient/family education;Vestibular;Visual/perceptual remediation/compensation;Passive range of motion;Moist Heat;Cryotherapy;Traction;Canalith Repostioning;Joint Manipulations;Manual lymph drainage;Manual techniques;Scar mobilization;Energy conservation;Dry needling;ADLs/Self Care Home Management;Biofeedback;Electrical Stimulation;Taping    PLAN FOR NEXT SESSION: See clinical impression for plan     GOALS: Goals reviewed with patient? Yes  SHORT TERM GOALS: Target date: 10/08/2023    Pt will demo IND with HEP                    Baseline: Not IND            Goal status: INITIAL   LONG TERM GOALS: Target date: 11/19/2023    1.Pt will demo proper deep core coordination without chest breathing and optimal excursion of diaphragm/pelvic floor in order to promote spinal stability and pelvic floor function  Baseline: dyscoordination Goal status: INITIAL  2.  Pt will demo proper body mechanics in against gravity tasks and ADLs  work tasks, fitness  to minimize straining pelvic floor / back  Baseline: not IND, improper form that places strain on pelvic floor  Goal status: INITIAL    3. Pt will demo increased gait speed > 1.5 m/s with reciprocal gait pattern, longer stride length  in order to ambulate safely in community and return to fitness routine  Baseline: 1.3 m/s with decreased stance on R,  Goal status: INITIAL    4. Pt will demo levelled pelvic girdle and shoulder height  in order to progress to deep core strengthening HEP and restore mobility at spine, pelvis, gait, posture minimize falls, and improve balance   Baseline: R shoulder, iliac crest lowered Goal status: INITIAL   5. Pt will report no burning sensation with urination and stronger stream, less difficulty starting and  completing urination by 50 %  in order to travel on plane to [place]  Baseline: burning sensation and tightness of pelvic floor mm / slow stream, difficulty with urination  Goal status: INITIAL     09/10/2023, 10:26 AM

## 2023-09-10 NOTE — Patient Instructions (Signed)
 Stretch :   Sit at 45 deg turn with R leg and knee on edge of chair/ bench, L buttock hanging off the edge to bring the L foot back like a lunge, toes bent, lower heel to feel quad stretch,  pay attention to keeping pinky and first toe ballmound planted to align toes forward so ankles are not twerked   Repeat with other side   ___  Happy baby ( knees to chest)   ___  Discontinue the heel slide back  V slides    ___    Lengthen Back rib by L  shoulder ( winging)    Lie on R  side , pillow between knees and under head  Pull  L arm overhead over mattress, grab the edge of mattress,pull it upward, drawing elbow away from ears  Breathing 10 reps  Brushing arm with 3/4 turn onto pillow behind back  Lying on R  side ,Pillow/ Block between knees     dragging top forearm across ribs below breast rotating 3/4 turn,  rotating  _L_ only this week ,  relax onto the pillow behind the back  and then back to other palm , maintain top palm on body whole top and not lift shoulder  Do this side this week       Wait do both sides until we have levelled out your spine and shoulders ___   Lengthen Back rib by R  shoulder   ( winging)    Lie on L  side , pillow between knees and under head  Pull  R arm overhead over mattress, grab the edge of mattress,pull it upward, drawing elbow away from ears  Breathing 10 reps  Brushing arm with 3/4 turn onto pillow behind back  Lying on L  side ,Pillow/ Block between knees     dragging top forearm across ribs below breast rotating 3/4 turn,  rotating  _R_ only this week ,  relax onto the pillow behind the back  and then back to other palm , maintain top palm on body whole top and not lift shoulder  Do this side this week       Wait do both sides until we have levelled out your spine and shoulders  __  Wear shoe lift in toe box and heel in R shoe all week, remove if uncomfortable,  If it helps, I will show you where to buy to replace every 6  months and to put in other shoes on R shoe only

## 2023-09-29 ENCOUNTER — Other Ambulatory Visit: Payer: Self-pay

## 2023-09-29 DIAGNOSIS — E785 Hyperlipidemia, unspecified: Secondary | ICD-10-CM

## 2023-10-01 ENCOUNTER — Ambulatory Visit: Attending: Urology | Admitting: Physical Therapy

## 2023-10-01 DIAGNOSIS — M533 Sacrococcygeal disorders, not elsewhere classified: Secondary | ICD-10-CM | POA: Diagnosis present

## 2023-10-01 DIAGNOSIS — R2689 Other abnormalities of gait and mobility: Secondary | ICD-10-CM | POA: Diagnosis present

## 2023-10-01 DIAGNOSIS — R278 Other lack of coordination: Secondary | ICD-10-CM

## 2023-10-01 NOTE — Patient Instructions (Addendum)
  LIFT feet arches to minimize overactivity of pelvic floor  1) remove stiff orthotics  2) move the shoe lifts into sneakers   __  3) walking with higher knees, land more on ballmounds and less on heels                      4) sit to stand with lifted arches Proper body mechanics with getting out of a chair to decrease strain  on back &pelvic floor   Avoid holding your breath when Getting out of the chair:  Scoot to front part of chair chair Heels behind knees, feet are hip width apart, nose over toes  Toe up and spread/ arch lifts   Inhale like you are smelling roses Exhale to stand   __  5) daily 2 x day  Feet slides : Points of contact at sitting bones  Four points of contact of foot,  Side knee back while keeping knee out along 2-3rd toe line  Heel up, ankle not twist out Lower heel while keeping knee out along 2-3rd toe line Four points of contact of foot, Slide foot back while keeping knee out along 2-3rd toe line   Repeated with other foot    3 min

## 2023-10-01 NOTE — Therapy (Signed)
 OUTPATIENT PHYSICAL THERAPY TREATMENT    Patient Name: Joseph Drake MRN: [id number] DOB:05-30-1960, 63 y.o., male Today's Date: 10/01/2023   PT End of Session - 10/01/23 1512     Visit Number 2    Number of Visits 10    Date for PT Re-Evaluation 11/19/23    PT Start Time 1507    PT Stop Time 1545    PT Time Calculation (min) 38 min    Activity Tolerance Patient tolerated treatment well    Behavior During Therapy WFL for tasks assessed/performed             Past Medical History:  Diagnosis Date   Acid reflux    Allergy    BPH (benign prostatic hypertrophy)    Candida infection    Dyspnea    ED (erectile dysfunction)    Genital herpes    History of continuous positive airway pressure (CPAP) therapy    HTN (hypertension)    Hydronephrosis    Hyperlipidemia    Kidney stone on left side    LVH (left ventricular hypertrophy)    Obesity    Palpitation    Holter in May 2014 with NSR, PVC, rare PACs   Palpitation    Sleep apnea    Testosterone  deficiency    Past Surgical History:  Procedure Laterality Date   CIRCUMCISION     NASAL SEPTUM SURGERY     Patient Active Problem List   Diagnosis Date Noted   Neck pain 06/25/2022   Tinnitus of both ears 12/26/2020   Change in hearing 11/18/2020   Anxiety 09/17/2016   GERD (gastroesophageal reflux disease) 08/10/2016   Rosacea 08/10/2016   Laryngopharyngeal reflux (LPR) 06/09/2016   Hypertrophy, nasal, turbinate 05/01/2016   Gout 01/16/2016   BPH with obstruction/lower urinary tract symptoms 09/21/2015   Erectile dysfunction of organic origin 09/21/2015   Knee pain 07/09/2015   Essential hypertension 04/23/2010   Obstructive sleep apnea 03/13/2010   VENTRICULAR HYPERTROPHY, LEFT 03/13/2010   Genital herpes 11/27/2008   HYPERSOMNIA, ASSOCIATED WITH SLEEP APNEA 08/29/2008   Allergic rhinitis 06/27/2008   TESTOSTERONE  DEFICIENCY 01/28/2008   HLD (hyperlipidemia) 09/23/2006    PCP: Vallarie Gauze  REFERRING PROVIDER:  Ledon Pry  REFERRING DIAG: pelvic floor dysfunction  Rationale for Evaluation and Treatment Rehabilitation  THERAPY DIAG:  Sacrococcygeal disorders, not elsewhere classified  Other abnormalities of gait and mobility  Other lack of coordination  ONSET DATE:   SUBJECTIVE:           SUBJECTIVE STATEMENT   Pt notices pelvic floor is starting to relax more. Pt had only one incident across 2 weeks when he felt burning sensation with starting to urinate while waering shoe lift in R shoe for the past 2 weeks. Prior to wearing shift lift in R shoe, pt had burning sensation with urination with 2-3 x a week.    Pt feels the exercises have helped with emptying and relaxation of pelvic floor.  Pt noticed he slept through the night without needing to get up to urinate for 5-6 nights across the past 2 weeks with shoe lift wearing and doing PT exercises.     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 L is worse than R.  SUBJECTIVE STATEMENT ON EVAL 09/10/23 : Pt is not able to relax mm when he is on plane or bus.Pt has to wait too long and then the mm get tight and he is not able to relax them to go.  Pt complete Pelvic PT in 2019 and the exercises and Tx helped.Pt only travelled by bus during the PT Tx but did not travel by plane.   Pt continued with PT exercises after being discharged. Pt was not getting up to pee in the middle of night. Pt continue t wear CPAP machine for OSA. Pt stopped doing exerises because things got better. Pt started to do exercises again recently. Pt has a trip planned to fly to [place] late June. July. Pt does not want to have tight pelvic floor mm because he has issues going to the  bathroom when travelling on planes and buses. Pt wonders if it is the motion of using the restroom on planes. Pt sits to use the bathroom. Pt flew to [place] in 2022 and had this issue. Pt waited to urinate when the plane landed. The flow was slow and he could not fully empty. Pt would urinate a little bit and then walk around and return to the toilet and go again.   Pt notices his pelvic floor are trying to relax while still staying restricted. They are straining with mm burning sensation.     PERTINENT HISTORY:  Pt found pelvic floor PT helped int he past.   PAIN:  Are you having pain? No   PRECAUTIONS: None  WEIGHT BEARING RESTRICTIONS: No  FALLS:  Has patient fallen in last 6 months? No  LIVING ENVIRONMENT: Lives with: with wife  Lives in: one story  Stairs: 4 STE with rail    OCCUPATION:  desk job   PLOF: IND  PATIENT GOALS: Improve mm relaxation in pelvic floor when peeing    OBJECTIVE:    [facility] PT Assessment - 10/01/23 1523       Single Leg Stance   Comments B 3 sec without UE support      Sit to Stand   Comments medial collapse of arches with rise from sitting, adducted knees      Strength   Overall Strength Comments single UE support, PF MMT4/5,  L 15 reps ,  R  15 reps  before fatigue  ( pt reported more difficulty on R )      Palpation   SI assessment  levlelled pelvis ahd shoulders with shoe lift in R shoe for the past two weeks in toe box and heel    Palpation comment hypomobile midfoot joints. tib-fib joint B ,             [facility] Adult PT Treatment/Exercise - 10/01/23 1651       Self-Care   Other Self-Care Comments  explained relationship feet and pelvic floor, showed anatomy, answered questions about travel, provided encouragement for not needing to modify for bladder issues when travelling and source of overactivty of pelvic floor is related lower knietic chain, plan to discuss biopsychosocial apporaches for travelling and overcoming pelvic  floor dysfunction      Therapeutic Activites    Other Therapeutic Activities excessive cues for gait training , more anterior COM . ballmounds , higher knees longer stride      Neuro Re-ed    Neuro Re-ed Details  cued for feet mobility, cued for gait mechancis to minimize heel strike, optimize longer strides, arch strenghtening to minimize collapsed arches  which is related pelvic floor dysfunction      Manual Therapy   Manual Therapy Taping    Manual therapy comments to lift arches, DF/EV  B    Other Manual Therapy distraction/ STM/MWM at midfoot joints, tib-fib on L ( plan to address R foot next session)                HOME EXERCISE PROGRAM: See pt instruction section    ASSESSMENT:  CLINICAL IMPRESSION:  Improvements across the past 1 visit since wearing shoe lift in R shoe toe box and heel:  Pt had only one incident across 2 weeks when he felt burning sensation with starting to urinate while waering shoe lift in R shoe for the past 2 weeks. Prior to wearing shift lift in R shoe, pt had burning sensation with urination with 2-3 x a week.   Pt noticed he slept through the night without needing to get up to urinate for 5-6 nights across the past 2 weeks with shoe lift wearing and doing PT exercises ( happy baby stretch and winging/brushing for thoracic / back stretch)     Today, focused on helping pt strengthen B feet arches to minimize overactivity of pelvic floor .  Provided manual Tx on L foot and plan to apply manual Tx to R foot next session. Provided excessive cues for gait training , more anterior COM . ballmounds , higher knees longer stride. Pt demo'd improvement post Tx   Explained relationship feet and pelvic floor, showed anatomy, answered questions about travel, provided encouragement for not needing to modify for bladder issues when travelling and source of overactivty of pelvic floor is related lower knietic chain, plan to discuss biopsychosocial apporaches for  travelling and overcoming pelvic floor dysfunction    Anticipate these improvements will help promote optimize IAP system for improved pelvic floor function, trunk stability, gait, balance, stabilization with mobility tasks.     Pt benefits from skilled PT.    OBJECTIVE IMPAIRMENTS decreased activity tolerance, decreased coordination, decreased endurance, decreased mobility, difficulty walking, decreased ROM, decreased strength, decreased safety awareness, hypomobility, increased muscle spasms, impaired flexibility, improper body mechanics, postural dysfunction   ACTIVITY LIMITATIONS  self-care,   home chores, travelling    PARTICIPATION LIMITATIONS:  community, traveling activities    PERSONAL FACTORS        are also affecting patient's functional outcome.    REHAB POTENTIAL: Good   CLINICAL DECISION MAKING: Evolving/moderate complexity   EVALUATION COMPLEXITY: Moderate    PATIENT EDUCATION:    Education details: Showed pt anatomy images. Explained muscles attachments/ connection, physiology of deep core system/ spinal- thoracic-pelvis-lower kinetic chain as they relate to pt's presentation, Sx, and past Hx. Explained what and how these areas of deficits need to be restored to balance and function    See Therapeutic activity / neuromuscular re-education section  Answered pt's questions.   Person educated: Patient Education method: Explanation, Demonstration, Tactile cues, Verbal cues, and Handouts Education comprehension: verbalized understanding, returned demonstration, verbal cues required, tactile cues required, and needs further education     PLAN: PT FREQUENCY: 1x/week   PT DURATION: 10 weeks   PLANNED INTERVENTIONS:   Gait training;Stair training;Functional mobility training;DME Instruction;Therapeutic activities;Therapeutic exercise;Balance training;Neuromuscular re-education;Patient/family education;Vestibular;Visual/perceptual remediation/compensation;Passive range  of motion;Moist Heat;Cryotherapy;Traction;Canalith Repostioning;Joint Manipulations;Manual lymph drainage;Manual techniques;Scar mobilization;Energy conservation;Dry needling;ADLs/Self Care Home Management;Biofeedback;Electrical Stimulation;Taping    PLAN FOR NEXT SESSION: See clinical impression for plan     GOALS: Goals reviewed with patient? Yes  SHORT TERM GOALS: Target  date: 10/08/2023    Pt will demo IND with HEP                    Baseline: Not IND            Goal status: INITIAL   LONG TERM GOALS: Target date: 11/19/2023    1.Pt will demo proper deep core coordination without chest breathing and optimal excursion of diaphragm/pelvic floor in order to promote spinal stability and pelvic floor function  Baseline: dyscoordination Goal status: INITIAL  2.  Pt will demo proper body mechanics in against gravity tasks and ADLs  work tasks, fitness  to minimize straining pelvic floor / back    Baseline: not IND, improper form that places strain on pelvic floor  Goal status: INITIAL    3. Pt will demo increased gait speed > 1.5 m/s with reciprocal gait pattern, longer stride length  in order to ambulate safely in community and return to fitness routine  Baseline: 1.3 m/s with decreased stance on R,  Goal status: INITIAL    4. Pt will demo levelled pelvic girdle and shoulder height in order to progress to deep core strengthening HEP and restore mobility at spine, pelvis, gait, posture minimize falls, and improve balance  Baseline: R shoulder, iliac crest lowered Goal status: MET    5. Pt will report no burning sensation with urination and stronger stream, less difficulty starting and  completing urination by 50 %  in order to travel on plane to [place]  Baseline: burning sensation and tightness of pelvic floor mm / slow stream, difficulty with urination  Goal status: INITIAL  6. Pt will demo increased heel raise SLS with UE support > 20 reps before fatigue and increase static  SLS > 15 sec for balance and increasing arches to minimize overactivity of pelvic floor  Baseline: single UE support, PF MMT4/5, L 15 reps , R 15 reps before fatigue ( pt reported more difficulty on R )  Goal Status: NEW    10/01/2023, 3:13 PM

## 2023-10-08 ENCOUNTER — Ambulatory Visit: Admitting: Physical Therapy

## 2023-10-08 DIAGNOSIS — M533 Sacrococcygeal disorders, not elsewhere classified: Secondary | ICD-10-CM | POA: Diagnosis not present

## 2023-10-08 DIAGNOSIS — R2689 Other abnormalities of gait and mobility: Secondary | ICD-10-CM

## 2023-10-08 DIAGNOSIS — R278 Other lack of coordination: Secondary | ICD-10-CM

## 2023-10-08 NOTE — Therapy (Signed)
 OUTPATIENT PHYSICAL THERAPY TREATMENT    Patient Name: Joseph Drake MRN: [id number] DOB:Jun 27, 1960, 63 y.o., male Today's Date: 10/08/2023   PT End of Session - 10/08/23 1521     Visit Number 3    Number of Visits 10    Date for PT Re-Evaluation 11/19/23    PT Start Time 1515    PT Stop Time 1555    PT Time Calculation (min) 40 min    Activity Tolerance Patient tolerated treatment well    Behavior During Therapy WFL for tasks assessed/performed          Past Medical History:  Diagnosis Date   Acid reflux    Allergy    BPH (benign prostatic hypertrophy)    Candida infection    Dyspnea    ED (erectile dysfunction)    Genital herpes    History of continuous positive airway pressure (CPAP) therapy    HTN (hypertension)    Hydronephrosis    Hyperlipidemia    Kidney stone on left side    LVH (left ventricular hypertrophy)    Obesity    Palpitation    Holter in May 2014 with NSR, PVC, rare PACs   Palpitation    Sleep apnea    Testosterone  deficiency    Past Surgical History:  Procedure Laterality Date   CIRCUMCISION     NASAL SEPTUM SURGERY     Patient Active Problem List   Diagnosis Date Noted   Neck pain 06/25/2022   Tinnitus of both ears 12/26/2020   Change in hearing 11/18/2020   Anxiety 09/17/2016   GERD (gastroesophageal reflux disease) 08/10/2016   Rosacea 08/10/2016   Laryngopharyngeal reflux (LPR) 06/09/2016   Hypertrophy, nasal, turbinate 05/01/2016   Gout 01/16/2016   BPH with obstruction/lower urinary tract symptoms 09/21/2015   Erectile dysfunction of organic origin 09/21/2015   Knee pain 07/09/2015   Essential hypertension 04/23/2010   Obstructive sleep apnea 03/13/2010   VENTRICULAR HYPERTROPHY, LEFT 03/13/2010   Genital herpes 11/27/2008   HYPERSOMNIA, ASSOCIATED WITH SLEEP APNEA 08/29/2008   Allergic rhinitis 06/27/2008   TESTOSTERONE  DEFICIENCY 01/28/2008   HLD (hyperlipidemia) 09/23/2006    PCP: Vallarie Gauze  REFERRING PROVIDER:  Ledon Pry  REFERRING DIAG: pelvic floor dysfunction  Rationale for Evaluation and Treatment Rehabilitation  THERAPY DIAG:  Sacrococcygeal disorders, not elsewhere classified  Other abnormalities of gait and mobility  Other lack of coordination  ONSET DATE:   SUBJECTIVE:           SUBJECTIVE STATEMENT   Pt reports he slept through the night across 3 nights last week without getting to urinate.  Pt is not able to go without hi s orthotics because it caused burning pain in his foot.  Pt still wears his shoe lift in his R shoe toe box and heel  SUBJECTIVE STATEMENT ON EVAL 09/10/23 : Pt is not able to relax mm when he is on plane or bus.Pt has to wait too long and then the mm get tight and he is not able to relax them to go.  Pt complete Pelvic PT in 2019 and the exercises and Tx helped.Pt only travelled by bus during the PT Tx but did not travel by plane.   Pt continued with PT exercises after being discharged. Pt was not getting up to pee in the middle of night. Pt continue t wear CPAP machine for OSA. Pt stopped doing exerises because things got better. Pt started to do exercises again recently. Pt has a trip planned to fly to [place] late June. July. Pt does not want to have tight pelvic floor mm because he has issues going to the bathroom when travelling on planes and buses. Pt wonders if it is the motion of using the restroom on planes. Pt sits to use the bathroom. Pt flew to [place] in 2022 and had this issue. Pt waited to urinate when the plane landed. The flow was slow and he could not fully empty. Pt would urinate a little bit and then walk around and return to the toilet and go again.   Pt notices his pelvic floor are trying to relax while still staying restricted. They are straining with mm burning sensation.     PERTINENT HISTORY:  Pt found pelvic floor  PT helped int he past.   PAIN:  Are you having pain? No   PRECAUTIONS: None  WEIGHT BEARING RESTRICTIONS: No  FALLS:  Has patient fallen in last 6 months? No  LIVING ENVIRONMENT: Lives with: with wife  Lives in: one story  Stairs: 4 STE with rail    OCCUPATION:  desk job   PLOF: IND  PATIENT GOALS: Improve mm relaxation in pelvic floor when peeing    OBJECTIVE:     [facility] PT Assessment - 10/08/23 1524       Single Leg Stance   Comments B L -6 sec, R 10 sec      Strength   Overall Strength Comments L hip abd 3/5 , R 5/5          [facility] Adult PT Treatment/Exercise - 10/08/23 1700       Self-Care   Other Self-Care Comments  explained the role of shoe lift to level pelvis for optimal pelvic floor function, role of strenghtening hip abductor mm for LKC<, role of anterior tilt of pelvis for optimal pelvic floor lengthening      Neuro Re-ed    Neuro Re-ed Details  cued for LKC feet arch strengtheing and hip abduction strenghtening               HOME EXERCISE PROGRAM: See pt instruction section    ASSESSMENT:  CLINICAL IMPRESSION:  Improvements since wearing shoe lift in R shoe toe box and heel:  Pt had only one incident across 2 weeks when he felt burning sensation with starting to urinate while waering shoe lift in R shoe for the past 2 weeks. Prior to wearing shift lift in R shoe, pt had burning sensation with urination with 2-3 x a week.   Pt noticed he slept through the night without needing to get up to urinate for 5-6 nights across the past 2 weeks with shoe lift wearing and doing PT exercises ( happy baby stretch and winging/brushing for thoracic / back stretch)     Today, focused on helping  pt strengthen B feet arches to minimize overactivity of pelvic floor .  Provided LKC strengthening and hip abduction strenghtening HEP.  Pt demo'd correct technique post Tx with excessive cues for alignment and anterior tilt of pelvis and feet  co-activation  Plan to add resistance band training at waist for more gait training next session.   Explained relationship feet and pelvic floor, showed anatomy, answered questions about travel, provided encouragement for not needing to modify for bladder issues when travelling and source of overactivty of pelvic floor is related lower knietic chain, plan to discuss biopsychosocial apporaches for travelling and overcoming pelvic floor dysfunction    Anticipate these improvements will help promote optimize IAP system for improved pelvic floor function, trunk stability, gait, balance, stabilization with mobility tasks.     Pt benefits from skilled PT.    OBJECTIVE IMPAIRMENTS decreased activity tolerance, decreased coordination, decreased endurance, decreased mobility, difficulty walking, decreased ROM, decreased strength, decreased safety awareness, hypomobility, increased muscle spasms, impaired flexibility, improper body mechanics, postural dysfunction   ACTIVITY LIMITATIONS  self-care,   home chores, travelling    PARTICIPATION LIMITATIONS:  community, traveling activities    PERSONAL FACTORS        are also affecting patient's functional outcome.    REHAB POTENTIAL: Good   CLINICAL DECISION MAKING: Evolving/moderate complexity   EVALUATION COMPLEXITY: Moderate    PATIENT EDUCATION:    Education details: Showed pt anatomy images. Explained muscles attachments/ connection, physiology of deep core system/ spinal- thoracic-pelvis-lower kinetic chain as they relate to pt's presentation, Sx, and past Hx. Explained what and how these areas of deficits need to be restored to balance and function    See Therapeutic activity / neuromuscular re-education section  Answered pt's questions.   Person educated: Patient Education method: Explanation, Demonstration, Tactile cues, Verbal cues, and Handouts Education comprehension: verbalized understanding, returned demonstration, verbal cues  required, tactile cues required, and needs further education     PLAN: PT FREQUENCY: 1x/week   PT DURATION: 10 weeks   PLANNED INTERVENTIONS:   Gait training;Stair training;Functional mobility training;DME Instruction;Therapeutic activities;Therapeutic exercise;Balance training;Neuromuscular re-education;Patient/family education;Vestibular;Visual/perceptual remediation/compensation;Passive range of motion;Moist Heat;Cryotherapy;Traction;Canalith Repostioning;Joint Manipulations;Manual lymph drainage;Manual techniques;Scar mobilization;Energy conservation;Dry needling;ADLs/Self Care Home Management;Biofeedback;Electrical Stimulation;Taping    PLAN FOR NEXT SESSION: See clinical impression for plan     GOALS: Goals reviewed with patient? Yes  SHORT TERM GOALS: Target date: 10/08/2023    Pt will demo IND with HEP                    Baseline: Not IND            Goal status: INITIAL   LONG TERM GOALS: Target date: 11/19/2023    1.Pt will demo proper deep core coordination without chest breathing and optimal excursion of diaphragm/pelvic floor in order to promote spinal stability and pelvic floor function  Baseline: dyscoordination Goal status: INITIAL  2.  Pt will demo proper body mechanics in against gravity tasks and ADLs  work tasks, fitness  to minimize straining pelvic floor / back    Baseline: not IND, improper form that places strain on pelvic floor  Goal status: INITIAL    3. Pt will demo increased gait speed > 1.5 m/s with reciprocal gait pattern, longer stride length  in order to ambulate safely in community and return to fitness routine  Baseline: 1.3 m/s with decreased stance on R,  Goal status: INITIAL    4. Pt will demo levelled pelvic girdle and shoulder height in  order to progress to deep core strengthening HEP and restore mobility at spine, pelvis, gait, posture minimize falls, and improve balance  Baseline: R shoulder, iliac crest lowered Goal status: MET     5. Pt will report no burning sensation with urination and stronger stream, less difficulty starting and  completing urination by 50 %  in order to travel on plane to [place]  Baseline: burning sensation and tightness of pelvic floor mm / slow stream, difficulty with urination  Goal status: INITIAL  6. Pt will demo increased heel raise SLS with UE support > 20 reps before fatigue and increase static SLS > 15 sec for balance and increasing arches to minimize overactivity of pelvic floor  Baseline: single UE support, PF MMT4/5, L 15 reps , R 15 reps before fatigue ( pt reported more difficulty on R )  Goal Status: NEW    10/08/2023, 3:22 PM

## 2023-10-08 NOTE — Patient Instructions (Signed)
   Clam Shell 45 Degrees  Lying with hips and knees bent 45, one pillow between knees and ankles. Heel together, toes apart like ballerina,  Lift knee with exhale while pressing heels together. Be sure pelvis does not roll backward. Do not arch back. Do 10 times, L only   2 times per day.    Complimentary stretch:  Hip abductor and hip flexors stretch at the same time    Alternative if hip is tight,   Sit at 45 deg turn with R leg and knee on edge of chair/ bench, L buttock hanging off the edge to bring the L foot back like a lunge, toes bent, lower heel to feel quad stretch,  pay attention to keeping pinky and first toe ballmound planted to align toes forward so ankles are not twerked   Repeat with other side   ___  Strengthening feet arches:     Heel raises - heels together, minisquat Hold onto counter   Minisquat motion, trunk bent , gaze onto floor like you are looking at your reflection over a lake/pond,  Knees bent pointed out like a v , navel ( center of mass) more forward  Heels together as you lift, pointed out like a v  KNEES ARE ALIGNED BEHIND THE TOES TO MINIMIZE STRAIN ON THE KNEES your  navel ( center of mass) more forward to a avoid dropping down fast and rocking more weight back onto heels , keep heels pressing against each other the whole time   30 reps  ___

## 2023-10-22 ENCOUNTER — Ambulatory Visit: Admitting: Physical Therapy

## 2023-10-22 DIAGNOSIS — R278 Other lack of coordination: Secondary | ICD-10-CM

## 2023-10-22 DIAGNOSIS — M533 Sacrococcygeal disorders, not elsewhere classified: Secondary | ICD-10-CM | POA: Diagnosis not present

## 2023-10-22 DIAGNOSIS — R2689 Other abnormalities of gait and mobility: Secondary | ICD-10-CM

## 2023-10-22 NOTE — Therapy (Signed)
 OUTPATIENT PHYSICAL THERAPY TREATMENT    Patient Name: Joseph Drake MRN: [id number] DOB:1961/02/17, 63 y.o., male Today's Date: 10/22/2023   PT End of Session - 10/22/23 1507     Visit Number 4    Number of Visits 10    Date for PT Re-Evaluation 11/19/23    PT Start Time 1503    PT Stop Time 1545    PT Time Calculation (min) 42 min    Activity Tolerance Patient tolerated treatment well    Behavior During Therapy WFL for tasks assessed/performed          Past Medical History:  Diagnosis Date   Acid reflux    Allergy    BPH (benign prostatic hypertrophy)    Candida infection    Dyspnea    ED (erectile dysfunction)    Genital herpes    History of continuous positive airway pressure (CPAP) therapy    HTN (hypertension)    Hydronephrosis    Hyperlipidemia    Kidney stone on left side    LVH (left ventricular hypertrophy)    Obesity    Palpitation    Holter in May 2014 with NSR, PVC, rare PACs   Palpitation    Sleep apnea    Testosterone  deficiency    Past Surgical History:  Procedure Laterality Date   CIRCUMCISION     NASAL SEPTUM SURGERY     Patient Active Problem List   Diagnosis Date Noted   Neck pain 06/25/2022   Tinnitus of both ears 12/26/2020   Change in hearing 11/18/2020   Anxiety 09/17/2016   GERD (gastroesophageal reflux disease) 08/10/2016   Rosacea 08/10/2016   Laryngopharyngeal reflux (LPR) 06/09/2016   Hypertrophy, nasal, turbinate 05/01/2016   Gout 01/16/2016   BPH with obstruction/lower urinary tract symptoms 09/21/2015   Erectile dysfunction of organic origin 09/21/2015   Knee pain 07/09/2015   Essential hypertension 04/23/2010   Obstructive sleep apnea 03/13/2010   VENTRICULAR HYPERTROPHY, LEFT 03/13/2010   Genital herpes 11/27/2008   HYPERSOMNIA, ASSOCIATED WITH SLEEP APNEA 08/29/2008   Allergic rhinitis 06/27/2008   TESTOSTERONE  DEFICIENCY 01/28/2008   HLD (hyperlipidemia) 09/23/2006    PCP: Cleatus  REFERRING PROVIDER:  Helon  REFERRING DIAG: pelvic floor dysfunction  Rationale for Evaluation and Treatment Rehabilitation  THERAPY DIAG:  Sacrococcygeal disorders, not elsewhere classified  Other abnormalities of gait and mobility  Other lack of coordination  ONSET DATE:   SUBJECTIVE:           SUBJECTIVE STATEMENT   Pt reports he slept through the night across 1-2  nights across 2 week period  last week without getting to urinate.  Pt notices he was walking and holding his foot stiff.   Pt reported he was able to relax his pelvic floor muscles during urination when stop it from tightening.  SUBJECTIVE STATEMENT ON EVAL 09/10/23 : Pt is not able to relax mm when he is on plane or bus.Pt has to wait too long and then the mm get tight and he is not able to relax them to go.  Pt complete Pelvic PT in 2019 and the exercises and Tx helped.Pt only travelled by bus during the PT Tx but did not travel by plane.   Pt continued with PT exercises after being discharged. Pt was not getting up to pee in the middle of night. Pt continue t wear CPAP machine for OSA. Pt stopped doing exerises because things got better. Pt started to do exercises again recently. Pt has a trip planned to fly to [place] late June. July. Pt does not want to have tight pelvic floor mm because he has issues going to the bathroom when travelling on planes and buses. Pt wonders if it is the motion of using the restroom on planes. Pt sits to use the bathroom. Pt flew to [place] in 2022 and had this issue. Pt waited to urinate when the plane landed. The flow was slow and he could not fully empty. Pt would urinate a little bit and then walk around and return to the toilet and go again.   Pt notices his pelvic floor are trying to relax while still staying restricted. They are straining with mm burning sensation.     PERTINENT  HISTORY:  Pt found pelvic floor PT helped int he past.   PAIN:  Are you having pain? No   PRECAUTIONS: None  WEIGHT BEARING RESTRICTIONS: No  FALLS:  Has patient fallen in last 6 months? No  LIVING ENVIRONMENT: Lives with: with wife  Lives in: one story  Stairs: 4 STE with rail    OCCUPATION:  desk job   PLOF: IND  PATIENT GOALS: Improve mm relaxation in pelvic floor when peeing    OBJECTIVE:    [facility] PT Assessment - 10/23/23 1051       Observation/Other Assessments   Observations tightness with hip flexor L in modified stretch in HEP      Single Leg Stance   Comments L 10 sec, R 7 sec no UE support      Other:   Other/ Comments poor PF  eccentric control with backward walking      Strength   Overall Strength Comments B hip abd 5/5 , B PF MMT 3/5 15 reps with UE support.          [facility] Adult PT Treatment/Exercise - 10/23/23 1055       Therapeutic Activites    Other Therapeutic Activities reassessed goals, provided encouragement to continue with HEP      Neuro Re-ed    Neuro Re-ed Details  excessive cues for backward walking / forward walking  with resistance band at waist, cued for eccentric control and balance,  cued for SLS and single heel PF HEP with balance cues for less posterior COM/ heels, cued for HEP stretches that pt had questions about                  HOME EXERCISE PROGRAM: See pt instruction section    ASSESSMENT:  CLINICAL IMPRESSION:  Improvements since wearing shoe lift in R shoe toe box and heel:  Pt had only one incident across 2 weeks when he felt burning sensation with starting to urinate while waering shoe lift in R shoe for the past 2 weeks. Prior to wearing shift lift in R shoe, pt  had burning sensation with urination with 2-3 x a week.   Pt noticed he slept through the night without needing to get up to urinate for 5-6 nights across the past 2 weeks with shoe lift wearing and doing PT exercises  Pt reported he was  able to relax his pelvic floor muscles during urination when stop it from tightening.                                                                                                                                     Added resistance band training at waist for more gait training today. Pt required cues for eccentric control and balance,  cued for SLS and single heel PF HEP with balance cues for less posterior COM/ heels, cued for HEP stretches that pt had questions about    Explained relationship feet and pelvic floor, showed anatomy, answered questions about travel, provided encouragement for to continue with HEP, relax pelvic floor  when travelling. Explained the  source of overactivty of pelvic floor is related lower knietic chain, plan to discuss biopsychosocial apporaches for travelling and overcoming pelvic floor dysfunction    Anticipate these improvements will help promote optimize IAP system for improved pelvic floor function, trunk stability, gait, balance, stabilization with mobility tasks.     Pt benefits from skilled PT.    OBJECTIVE IMPAIRMENTS decreased activity tolerance, decreased coordination, decreased endurance, decreased mobility, difficulty walking, decreased ROM, decreased strength, decreased safety awareness, hypomobility, increased muscle spasms, impaired flexibility, improper body mechanics, postural dysfunction   ACTIVITY LIMITATIONS  self-care,   home chores, travelling    PARTICIPATION LIMITATIONS:  community, traveling activities    PERSONAL FACTORS        are also affecting patient's functional outcome.    REHAB POTENTIAL: Good   CLINICAL DECISION MAKING: Evolving/moderate complexity   EVALUATION COMPLEXITY: Moderate    PATIENT EDUCATION:    Education details: Showed pt anatomy images. Explained muscles attachments/ connection, physiology of deep core system/ spinal- thoracic-pelvis-lower kinetic chain as they relate to pt's presentation, Sx, and past Hx.  Explained what and how these areas of deficits need to be restored to balance and function    See Therapeutic activity / neuromuscular re-education section  Answered pt's questions.   Person educated: Patient Education method: Explanation, Demonstration, Tactile cues, Verbal cues, and Handouts Education comprehension: verbalized understanding, returned demonstration, verbal cues required, tactile cues required, and needs further education     PLAN: PT FREQUENCY: 1x/week   PT DURATION: 10 weeks   PLANNED INTERVENTIONS:   Gait training;Stair training;Functional mobility training;DME Instruction;Therapeutic activities;Therapeutic exercise;Balance training;Neuromuscular re-education;Patient/family education;Vestibular;Visual/perceptual remediation/compensation;Passive range of motion;Moist Heat;Cryotherapy;Traction;Canalith Repostioning;Joint Manipulations;Manual lymph drainage;Manual techniques;Scar mobilization;Energy conservation;Dry needling;ADLs/Self Care Home Management;Biofeedback;Electrical Stimulation;Taping    PLAN FOR NEXT SESSION: See clinical impression for plan     GOALS: Goals reviewed with patient? Yes  SHORT TERM GOALS: Target date: 10/08/2023    Pt will demo IND  with HEP                    Baseline: Not IND            Goal status: INITIAL   LONG TERM GOALS: Target date: 11/19/2023    1.Pt will demo proper deep core coordination without chest breathing and optimal excursion of diaphragm/pelvic floor in order to promote spinal stability and pelvic floor function  Baseline: dyscoordination Goal status: MET   2.  Pt will demo proper body mechanics in against gravity tasks and ADLs  work tasks, fitness  to minimize straining pelvic floor / back    Baseline: not IND, improper form that places strain on pelvic floor  Goal status: MET     3. Pt will demo increased gait speed > 1.5 m/s with reciprocal gait pattern, longer stride length  in order to ambulate safely  in community and return to fitness routine  Baseline: 1.3 m/s with decreased stance on R,  Goal status: MET ( 10/22/23:  1.55 m/s , reciprocal gait )     4. Pt will demo levelled pelvic girdle and shoulder height in order to progress to deep core strengthening HEP and restore mobility at spine, pelvis, gait, posture minimize falls, and improve balance  Baseline: R shoulder, iliac crest lowered Goal status: MET    5. Pt will report no burning sensation with urination and stronger stream, less difficulty starting and  completing urination by 50 %  in order to travel on plane to [place]  Baseline: burning sensation and tightness of pelvic floor mm / slow stream, difficulty with urination  Goal status: MET   6. Pt will demo increased heel raise SLS with UE support > 20 reps before fatigue and increase static SLS > 15 sec for balance and increasing arches to minimize overactivity of pelvic floor  Baseline: single UE support, PF MMT4/5, L 15 reps , R 15 reps before fatigue ( pt reported more difficulty on R )  Goal Status:  PARTIALLY MET    ( 10/22/23: PF strength:  15 reps on SLS on L and R LE,  static balance:  L 10 sec, R 7 sec no UE support    10/22/2023, 3:09 PM

## 2023-10-22 NOTE — Patient Instructions (Addendum)
   WALKING WITH RESISTANCE BLUE Band at waist connected to doorknob Stepping forward normal length steps, planting mid and forefoot down, center of mass ( navel) leans forward slightly as if you were walking uphill 3-4 steps till band feels taut ( MAKE SURE THE DOOR IS LOCKED AND WON'T OPEN)   Stepping backwards, lower heel slowly, carry trunk and hips back , leaning forward, front knee along 2-3 rd toe line   3 min  __   Single heel raises with hand support   Single leg standing with hand support

## 2023-10-28 ENCOUNTER — Ambulatory Visit: Admitting: Podiatry

## 2023-10-28 ENCOUNTER — Encounter: Payer: Self-pay | Admitting: Podiatry

## 2023-10-28 DIAGNOSIS — M7751 Other enthesopathy of right foot: Secondary | ICD-10-CM

## 2023-10-28 DIAGNOSIS — M778 Other enthesopathies, not elsewhere classified: Secondary | ICD-10-CM

## 2023-10-28 DIAGNOSIS — M7752 Other enthesopathy of left foot: Secondary | ICD-10-CM

## 2023-10-28 MED ORDER — TRIAMCINOLONE ACETONIDE 10 MG/ML IJ SUSP
10.0000 mg | Freq: Once | INTRAMUSCULAR | Status: AC
Start: 2023-10-28 — End: 2023-10-28
  Administered 2023-10-28: 10 mg via INTRA_ARTICULAR

## 2023-10-28 NOTE — Progress Notes (Signed)
 Subjective:   Patient ID: Joseph Drake, male   DOB: 63 y.o.   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

## 2023-11-04 ENCOUNTER — Other Ambulatory Visit: Payer: Self-pay | Admitting: Family Medicine

## 2023-11-04 DIAGNOSIS — Z125 Encounter for screening for malignant neoplasm of prostate: Secondary | ICD-10-CM

## 2023-11-04 DIAGNOSIS — I10 Essential (primary) hypertension: Secondary | ICD-10-CM

## 2023-11-04 DIAGNOSIS — R739 Hyperglycemia, unspecified: Secondary | ICD-10-CM

## 2023-11-04 DIAGNOSIS — M109 Gout, unspecified: Secondary | ICD-10-CM

## 2023-11-04 DIAGNOSIS — E785 Hyperlipidemia, unspecified: Secondary | ICD-10-CM

## 2023-11-04 NOTE — Telephone Encounter (Signed)
 Copied from CRM [id number]. Topic: Clinical - Request for Lab/Test Order >> Nov 04, 2023  1:04 PM Robinson H wrote: Reason for CRM: Patient wants to have labs done prior to his physical on 8/8, no orders in system.  Marko [phone]

## 2023-11-04 NOTE — Telephone Encounter (Signed)
 Lvm to schedule CPE and prior fasting labs

## 2023-11-04 NOTE — Telephone Encounter (Signed)
 Patient is due CPE please contact patient and schedule appointment and then route back to pool. Thank you

## 2023-11-06 ENCOUNTER — Telehealth: Payer: Self-pay

## 2023-11-06 NOTE — Telephone Encounter (Signed)
 Please contact patient to scheduled annual visit. Thank you

## 2023-11-11 NOTE — Telephone Encounter (Signed)
 Patient is scheduled

## 2023-11-12 NOTE — Telephone Encounter (Signed)
Labs ordered, rx sent 

## 2023-11-21 ENCOUNTER — Emergency Department: Admission: EM | Admit: 2023-11-21 | Discharge: 2023-11-21 | Disposition: A | Discharge status: Home / Self Care

## 2023-11-21 ENCOUNTER — Emergency Department

## 2023-11-21 ENCOUNTER — Other Ambulatory Visit: Payer: Self-pay

## 2023-11-21 DIAGNOSIS — W270XXA Contact with workbench tool, initial encounter: Secondary | ICD-10-CM | POA: Diagnosis not present

## 2023-11-21 DIAGNOSIS — S62663A Nondisplaced fracture of distal phalanx of left middle finger, initial encounter for closed fracture: Secondary | ICD-10-CM | POA: Diagnosis not present

## 2023-11-21 DIAGNOSIS — I10 Essential (primary) hypertension: Secondary | ICD-10-CM | POA: Diagnosis not present

## 2023-11-21 DIAGNOSIS — S6710XA Crushing injury of unspecified finger(s), initial encounter: Secondary | ICD-10-CM

## 2023-11-21 DIAGNOSIS — S6992XA Unspecified injury of left wrist, hand and finger(s), initial encounter: Secondary | ICD-10-CM | POA: Diagnosis present

## 2023-11-21 DIAGNOSIS — S67193A Crushing injury of left middle finger, initial encounter: Secondary | ICD-10-CM | POA: Insufficient documentation

## 2023-11-21 NOTE — Discharge Instructions (Signed)
 Please follow-up with orthopedic surgery.  Please keep your splint on to protect your finger.  Please return for any new, worsening, or changing symptoms or other concerns

## 2023-11-21 NOTE — ED Provider Notes (Signed)
 [facility] Provider Note    Event Date/Time   First MD Initiated Contact with Patient 11/21/23 1543     (approximate)   History   Finger Injury (L Middle)   HPI  Joseph Drake is a 63 y.o. male right-hand-dominant who presents today for evaluation of left third finger injury.  Patient reports that he accidentally hit his finger with a hammer today.  He noticed bruising to the pad of his finger and wants to be sure he does not have any broken bones.  No numbness or tingling.  Is able to flex extend his finger normally.  He has not noticed any fingernail injuries.  No open wounds.  Patient Active Problem List   Diagnosis Date Noted   Neck pain 06/25/2022   Tinnitus of both ears 12/26/2020   Change in hearing 11/18/2020   Anxiety 09/17/2016   GERD (gastroesophageal reflux disease) 08/10/2016   Rosacea 08/10/2016   Laryngopharyngeal reflux (LPR) 06/09/2016   Hypertrophy, nasal, turbinate 05/01/2016   Gout 01/16/2016   BPH with obstruction/lower urinary tract symptoms 09/21/2015   Erectile dysfunction of organic origin 09/21/2015   Knee pain 07/09/2015   Essential hypertension 04/23/2010   Obstructive sleep apnea 03/13/2010   VENTRICULAR HYPERTROPHY, LEFT 03/13/2010   Genital herpes 11/27/2008   HYPERSOMNIA, ASSOCIATED WITH SLEEP APNEA 08/29/2008   Allergic rhinitis 06/27/2008   TESTOSTERONE  DEFICIENCY 01/28/2008   HLD (hyperlipidemia) 09/23/2006          Physical Exam   Triage Vital Signs: ED Triage Vitals  Encounter Vitals Group     BP 11/21/23 1512 (!) 125/90     Girls Systolic BP Percentile --      Girls Diastolic BP Percentile --      Boys Systolic BP Percentile --      Boys Diastolic BP Percentile --      Pulse Rate 11/21/23 1512 (!) 115     Resp 11/21/23 1512 16     Temp 11/21/23 1512 98.1 F (36.7 C)     Temp Source 11/21/23 1512 Oral     SpO2 11/21/23 1512 96 %     Weight --      Height 11/21/23 1513 5' 9 (1.753 m)      Head Circumference --      Peak Flow --      Pain Score 11/21/23 1513 10     Pain Loc --      Pain Education --      Exclude from Growth Chart --     Most recent vital signs: Vitals:   11/21/23 1512 11/21/23 1627  BP: (!) 125/90   Pulse: (!) 115 93  Resp: 16 18  Temp: 98.1 F (36.7 C)   SpO2: 96% 97%    Physical Exam Vitals and nursing note reviewed.  Constitutional:      General: Awake and alert. No acute distress.    Appearance: Normal appearance. The patient is normal weight.  HENT:     Head: Normocephalic and atraumatic.     Mouth: Mucous membranes are moist.  Eyes:     General: PERRL. Normal EOMs        Right eye: No discharge.        Left eye: No discharge.     Conjunctiva/sclera: Conjunctivae normal.  Cardiovascular:     Rate and Rhythm: Normal rate and regular rhythm.     Pulses: Normal pulses.  Pulmonary:     Effort: Pulmonary effort is normal. No respiratory  distress.     Breath sounds: Normal breath sounds.  Abdominal:     Abdomen is soft. There is no abdominal tenderness. No rebound or guarding. No distention. Musculoskeletal:        General: No swelling. Normal range of motion.     Cervical back: Normal range of motion and neck supple.  Left third finger with ecchymosis noted to the pad of the finger.  No fluctuance.  No volar abnormalities.  No nailbed injuries.  No subungual hematoma.  Able to flex and extend at isolated PIP, DIP, and MCP against resistance.  No open wounds. Skin:    General: Skin is warm and dry.     Capillary Refill: Capillary refill takes less than 2 seconds.     Findings: No rash.  Neurological:     Mental Status: The patient is awake and alert.      ED Results / Procedures / Treatments   Labs (all labs ordered are listed, but only abnormal results are displayed) Labs Reviewed - No data to display   EKG     RADIOLOGY I independently reviewed and interpreted imaging and agree with radiologists  findings.     PROCEDURES:  Critical Care performed:   Procedures   MEDICATIONS ORDERED IN ED: Medications - No data to display   IMPRESSION / MDM / ASSESSMENT AND PLAN / ED COURSE  I reviewed the triage vital signs and the nursing notes.   Differential diagnosis includes, but is not limited to, fracture, dislocation, contusion, hematoma.  Patient is awake and alert, hemodynamically stable and afebrile.  Patient is able to flex and extend at isolated PIP, DIP, and MCP against resistance.  There are no open wounds or nailbed injuries.  There is no subungual hematoma.  X-ray obtained in triage reveals a nondisplaced tuft fracture.  This is a closed fracture.  He was placed in a finger splint.  We discussed return precautions and outpatient follow-up.  Orthopedic surgery follow-up information was provided.  Patient understands and agrees with plan.  Discharged in stable condition.   Patient's presentation is most consistent with acute complicated illness / injury requiring diagnostic workup.      FINAL CLINICAL IMPRESSION(S) / ED DIAGNOSES   Final diagnoses:  Crushing injury of finger, initial encounter  Closed nondisplaced fracture of distal phalanx of left middle finger, initial encounter     Rx / DC Orders   ED Discharge Orders     None        Note:  This document was prepared using Dragon voice recognition software and may include unintentional dictation errors.   Lillyanna Glandon E, PA-C 11/21/23 1726    Clarine Ozell LABOR, MD 11/21/23 AVA

## 2023-11-21 NOTE — ED Triage Notes (Signed)
 Pt to ed from home via POV for finger injury to distal tip of left middle. Pt was using a hammer and missed the nail and struck his finger. Pt has swelling and discoloration to left middle finger. Pt is caox4, in no acute distress and ambulatory in triage.

## 2023-11-24 ENCOUNTER — Telehealth: Payer: Self-pay

## 2023-11-24 NOTE — Telephone Encounter (Signed)
 Copied from CRM [id number]. Topic: Appointments - Scheduling Inquiry for Clinic >> Nov 24, 2023 12:04 PM Mesmerise C wrote: Reason for CRM: Patient inquiring if he labs on 8/1 will be okay for his appointment he had to reschedule on 9/4 or would he need to have his labs redone before his physical

## 2023-11-25 NOTE — Telephone Encounter (Signed)
 Glad to see him whenever possible.  Those labs should be fine.  Thanks.

## 2023-11-26 ENCOUNTER — Telehealth: Payer: Self-pay

## 2023-11-26 NOTE — Telephone Encounter (Signed)
 Patient notified that it would of been okay but he had already rescheduled the lab appointment to 8/25

## 2023-11-26 NOTE — Transitions of Care (Post Inpatient/ED Visit) (Signed)
 Unable to reach patient by phone and left v/m requesting call back at [phone].      11/26/2023  Name: Joseph Drake MRN: [id number] DOB: December 26, 1960  Today's TOC FU Call Status: Today's TOC FU Call Status:: Unsuccessful Call (1st Attempt) Unsuccessful Call (1st Attempt) Date: 11/26/23  Attempted to reach the patient regarding the most recent Inpatient/ED visit.  Follow Up Plan: Additional outreach attempts will be made to reach the patient to complete the Transitions of Care (Post Inpatient/ED visit) call.   Signature  Laray Arenas, LPN

## 2023-11-26 NOTE — Transitions of Care (Post Inpatient/ED Visit) (Signed)
 Pt returned call while I was at lunch. Unable to reach patient by phone and left v/m requesting call back at [phone].        11/26/2023  Name: Joseph Drake MRN: [id number] DOB: 03-13-1961  Today's TOC FU Call Status: Today's TOC FU Call Status:: Unsuccessful Call (2nd Attempt) Unsuccessful Call (1st Attempt) Date: 11/26/23 Unsuccessful Call (2nd Attempt) Date: 11/26/23  Attempted to reach the patient regarding the most recent Inpatient/ED visit.  Follow Up Plan: Additional outreach attempts will be made to reach the patient to complete the Transitions of Care (Post Inpatient/ED visit) call.   Signature  Laray Arenas, LPN

## 2023-11-26 NOTE — Telephone Encounter (Unsigned)
 Copied from CRM #[id number]. Topic: General - Other >> Nov 26, 2023 10:22 AM Franky GRADE wrote: Reason for CRM: Patient is returning a call her received from Jenna Arenas. Attempted to call the number on the encounter message.

## 2023-11-27 ENCOUNTER — Other Ambulatory Visit

## 2023-11-30 NOTE — Transitions of Care (Post Inpatient/ED Visit) (Signed)
 I spoke with pt and he was seen [facility] ED on 11/21/23 after hitting is L mid finger with hammer.pt said slight fx and was fitted with splint and pt said he is doing well at this time.  Pt hs not schduled appt with ortho due to finger doing well and not hurting. Pt will contact ortho or Dr Cleatus if needeed. Sending note to Dr Cleatus.       11/30/2023  Name: Joseph Drake MRN: [id number] DOB: 1960-05-14  Today's TOC FU Call Status: Today's TOC FU Call Status:: Successful TOC FU Call Completed Unsuccessful Call (1st Attempt) Date: 11/30/23 Unsuccessful Call (2nd Attempt) Date: 11/26/23 [facility] FU Call Complete Date: 11/30/23 Patient's Name and Date of Birth confirmed.  Transition Care Management Follow-up Telephone Call Date of Discharge: 11/21/23 Discharge Facility: [facility]) Type of Discharge: Emergency Department Reason for ED Visit: Other: (I spoke with pt and he was seen [facility] ED on 11/21/23 after hitting is L mid finger with hammer.pt said slight fx and was fitted with splint and pt said he is doing well at this time.) How have you been since you were released from the hospital?: Better Any questions or concerns?: No  Items Reviewed: Did you receive and understand the discharge instructions provided?: Yes Medications obtained,verified, and reconciled?: No Medications Not Reviewed Reasons:: Other: (pt said was given no med to take home after visit.) Any new allergies since your discharge?: No Dietary orders reviewed?: NA Do you have support at home?: Yes People in Home [RPT]: spouse Name of Support/Comfort Primary Source: joyce  Medications Reviewed Today: Medications Reviewed Today   Medications were not reviewed in this encounter     Home Care and Equipment/Supplies: Were Home Health Services Ordered?: NA Any new equipment or medical supplies ordered?: Yes Name of Medical supply agency?:  (pt received finger splint while at [facility] ED.) Were you  able to get the equipment/medical supplies?: Yes Do you have any questions related to the use of the equipment/supplies?: No  Functional Questionnaire: Do you need assistance with bathing/showering or dressing?: No Do you need assistance with meal preparation?: No Do you need assistance with eating?: No Do you have difficulty maintaining continence: No Do you need assistance with getting out of bed/getting out of a chair/moving?: No Do you have difficulty managing or taking your medications?: No  Follow up appointments reviewed: PCP Follow-up appointment confirmed?: NA (pt said he is doing well with no pain and will contact ortho or Dr Cleatus if needed.) [facility] Follow-up appointment confirmed?: NA Do you need transportation to your follow-up appointment?: No Do you understand care options if your condition(s) worsen?: Yes-patient verbalized understanding    SIGNATURE Laray Arenas, LPN

## 2023-12-01 NOTE — Telephone Encounter (Signed)
 Noted. Thanks.

## 2023-12-04 ENCOUNTER — Encounter: Admitting: Family Medicine

## 2023-12-13 ENCOUNTER — Other Ambulatory Visit: Payer: Self-pay | Admitting: Internal Medicine

## 2023-12-21 ENCOUNTER — Other Ambulatory Visit (INDEPENDENT_AMBULATORY_CARE_PROVIDER_SITE_OTHER)

## 2023-12-21 DIAGNOSIS — E785 Hyperlipidemia, unspecified: Secondary | ICD-10-CM

## 2023-12-21 DIAGNOSIS — M109 Gout, unspecified: Secondary | ICD-10-CM | POA: Diagnosis not present

## 2023-12-21 DIAGNOSIS — R739 Hyperglycemia, unspecified: Secondary | ICD-10-CM

## 2023-12-21 DIAGNOSIS — I10 Essential (primary) hypertension: Secondary | ICD-10-CM | POA: Diagnosis not present

## 2023-12-21 DIAGNOSIS — Z125 Encounter for screening for malignant neoplasm of prostate: Secondary | ICD-10-CM

## 2023-12-21 LAB — LIPID PANEL
Cholesterol: 108 mg/dL (ref 0–200)
HDL: 34.5 mg/dL — ABNORMAL LOW (ref >39.00)
LDL Cholesterol: 17 mg/dL (ref 0–99)
NonHDL: 73.66
Total CHOL/HDL Ratio: 3
Triglycerides: 281 mg/dL — ABNORMAL HIGH (ref 0.0–149.0)
VLDL: 56.2 mg/dL — ABNORMAL HIGH (ref 0.0–40.0)

## 2023-12-21 LAB — URIC ACID: Uric Acid, Serum: 7.8 mg/dL (ref 4.0–7.8)

## 2023-12-21 LAB — COMPREHENSIVE METABOLIC PANEL WITH GFR
ALT: 70 U/L — ABNORMAL HIGH (ref 0–53)
AST: 33 U/L (ref 0–37)
Albumin: 4.1 g/dL (ref 3.5–5.2)
Alkaline Phosphatase: 97 U/L (ref 39–117)
BUN: 12 mg/dL (ref 6–23)
CO2: 30 meq/L (ref 19–32)
Calcium: 9 mg/dL (ref 8.4–10.5)
Chloride: 101 meq/L (ref 96–112)
Creatinine, Ser: 1.11 mg/dL (ref 0.40–1.50)
GFR: 70.32 mL/min (ref >60.00)
Glucose, Bld: 132 mg/dL — ABNORMAL HIGH (ref 70–99)
Potassium: 4.6 meq/L (ref 3.5–5.1)
Sodium: 141 meq/L (ref 135–145)
Total Bilirubin: 0.7 mg/dL (ref 0.2–1.2)
Total Protein: 6.8 g/dL (ref 6.0–8.3)

## 2023-12-21 LAB — PSA: PSA: 0.79 ng/mL (ref 0.10–4.00)

## 2023-12-21 LAB — HEMOGLOBIN A1C: Hgb A1c MFr Bld: 6.8 % — ABNORMAL HIGH (ref 4.6–6.5)

## 2023-12-22 LAB — NMR, LIPOPROFILE
Cholesterol, Total: 118 mg/dL (ref 100–199)
HDL Particle Number: 32.9 umol/L (ref >=30.5)
HDL-C: 33 mg/dL — ABNORMAL LOW (ref >39)
LDL Particle Number: 517 nmol/L (ref <1000)
LDL Size: 19.6 nm — ABNORMAL LOW (ref >20.5)
LDL-C (NIH Calc): 44 mg/dL (ref 0–99)
LP-IR Score: 100 — ABNORMAL HIGH (ref <=45)
Small LDL Particle Number: 403 nmol/L (ref <=527)
Triglycerides: 265 mg/dL — ABNORMAL HIGH (ref 0–149)

## 2023-12-23 ENCOUNTER — Ambulatory Visit: Payer: Self-pay | Admitting: Family Medicine

## 2023-12-23 ENCOUNTER — Encounter: Payer: Self-pay | Admitting: Internal Medicine

## 2023-12-31 ENCOUNTER — Encounter: Payer: Self-pay | Admitting: Family Medicine

## 2023-12-31 ENCOUNTER — Ambulatory Visit: Admitting: Family Medicine

## 2023-12-31 VITALS — BP 132/78 | HR 71 | Temp 98.1°F | Ht 69.13 in | Wt 231.8 lb

## 2023-12-31 DIAGNOSIS — F419 Anxiety disorder, unspecified: Secondary | ICD-10-CM | POA: Diagnosis not present

## 2023-12-31 DIAGNOSIS — E785 Hyperlipidemia, unspecified: Secondary | ICD-10-CM

## 2023-12-31 DIAGNOSIS — I10 Essential (primary) hypertension: Secondary | ICD-10-CM

## 2023-12-31 DIAGNOSIS — Z0000 Encounter for general adult medical examination without abnormal findings: Secondary | ICD-10-CM | POA: Diagnosis not present

## 2023-12-31 DIAGNOSIS — Z7189 Other specified counseling: Secondary | ICD-10-CM

## 2023-12-31 DIAGNOSIS — R7989 Other specified abnormal findings of blood chemistry: Secondary | ICD-10-CM

## 2023-12-31 DIAGNOSIS — E119 Type 2 diabetes mellitus without complications: Secondary | ICD-10-CM | POA: Diagnosis not present

## 2023-12-31 DIAGNOSIS — G720 Drug-induced myopathy: Secondary | ICD-10-CM

## 2023-12-31 NOTE — Patient Instructions (Addendum)
 I would get a flu shot each fall.   Use the eat right diet.   Recheck in about 3 months.  Labs before the visit. Nonfasting labs.   Update me as needed.   Take care.  Glad to see you.

## 2023-12-31 NOTE — Progress Notes (Signed)
 CPE- See plan.  Routine anticipatory guidance given to patient.  See health maintenance.  The possibility exists that previously documented standard health maintenance information may have been brought forward from a previous encounter into this note.  If needed, that same information has been updated to reflect the current situation based on today's encounter.    Flu done yearly per patient report, at work.  Tetanus 2022 PNA d/w pt.   shingrix 2020 covid vaccine 2021, pfizer. Colonoscopy 2017 PSA 2025.  Living will- wife designated if patient were incapacitated.   HCV preg nev HIV screening done, neg, in 1990s.   Diet and exercise, d/w pt.    Mood is better on lexapro , ie reasonable to continue.  No SI/HI.    No recent gout flares.    LFT elevation d/w pt.  D/w pt about possible fatty liver given DM.  No jaundice.    Diabetes:  No meds.   New dx.  Path/phys d/w pt.   Low sugar diet handout d/w pt.   Statin intolerant.    Hypertension:    Using medication without problems or lightheadedness: yes Chest pain with exertion:no Edema:no Short of breath:no Labs d/w pt.   Elevated Cholesterol: Using medications without problems: yes Muscle aches: no Diet compliance: d/w pt.  Exercise: d/w pt.  Labs d/w pt.   PMH and SH reviewed  Meds, vitals, and allergies reviewed.   ROS: Per HPI.  Unless specifically indicated otherwise in HPI, the patient denies:  General: fever. Eyes: acute vision changes ENT: sore throat Cardiovascular: chest pain Respiratory: SOB GI: vomiting GU: dysuria Musculoskeletal: acute back pain Derm: acute rash Neuro: acute motor dysfunction Psych: worsening mood Endocrine: polydipsia Heme: bleeding Allergy: hayfever  GEN: nad, alert and oriented HEENT: mucous membranes moist NECK: supple w/o LA CV: rrr. PULM: ctab, no inc wob ABD: soft, +bs EXT: no edema SKIN: no acute rash  Diabetic foot exam: Normal inspection No skin breakdown No  calluses  Normal DP pulses Normal sensation to light touch and monofilament Nails normal

## 2024-01-03 DIAGNOSIS — G720 Drug-induced myopathy: Secondary | ICD-10-CM | POA: Insufficient documentation

## 2024-01-03 DIAGNOSIS — E119 Type 2 diabetes mellitus without complications: Secondary | ICD-10-CM | POA: Insufficient documentation

## 2024-01-03 DIAGNOSIS — R7989 Other specified abnormal findings of blood chemistry: Secondary | ICD-10-CM | POA: Insufficient documentation

## 2024-01-03 NOTE — Assessment & Plan Note (Addendum)
Living will- wife designated if patient were incapacitated.  

## 2024-01-03 NOTE — Assessment & Plan Note (Signed)
 Flu done yearly per patient report, at work.  Tetanus 2022 PNA d/w pt.   shingrix 2020 covid vaccine 2021, pfizer. Colonoscopy 2017 PSA 2025.  Living will- wife designated if patient were incapacitated.   HCV preg nev HIV screening done, neg, in 1990s.   Diet and exercise, d/w pt.

## 2024-01-03 NOTE — Assessment & Plan Note (Signed)
 Discussed diet and exercise. Continue benazepril .

## 2024-01-03 NOTE — Assessment & Plan Note (Signed)
 Statin intolerant

## 2024-01-03 NOTE — Assessment & Plan Note (Signed)
 No meds.   New dx.  Path/phys d/w pt.   Low sugar diet handout d/w pt.   Recheck periodically.

## 2024-01-03 NOTE — Assessment & Plan Note (Signed)
-   Continue lexapro

## 2024-01-03 NOTE — Assessment & Plan Note (Signed)
 LFT elevation d/w pt.  D/w pt about possible fatty liver given DM.  No jaundice.   D/w pt about diet and exercise and we can recheck periodically.

## 2024-01-03 NOTE — Assessment & Plan Note (Signed)
 Statin intolerant.   Continue repatha .

## 2024-01-04 ENCOUNTER — Encounter: Payer: Self-pay | Admitting: Internal Medicine

## 2024-01-04 ENCOUNTER — Ambulatory Visit: Attending: Internal Medicine | Admitting: Internal Medicine

## 2024-01-04 VITALS — BP 108/72 | HR 110 | Ht 69.0 in | Wt 229.8 lb

## 2024-01-04 DIAGNOSIS — E785 Hyperlipidemia, unspecified: Secondary | ICD-10-CM | POA: Diagnosis not present

## 2024-01-04 DIAGNOSIS — E781 Pure hyperglyceridemia: Secondary | ICD-10-CM

## 2024-01-04 DIAGNOSIS — M791 Myalgia, unspecified site: Secondary | ICD-10-CM

## 2024-01-04 DIAGNOSIS — I739 Peripheral vascular disease, unspecified: Secondary | ICD-10-CM

## 2024-01-04 DIAGNOSIS — R000 Tachycardia, unspecified: Secondary | ICD-10-CM

## 2024-01-04 DIAGNOSIS — R931 Abnormal findings on diagnostic imaging of heart and coronary circulation: Secondary | ICD-10-CM | POA: Diagnosis not present

## 2024-01-04 DIAGNOSIS — T466X5D Adverse effect of antihyperlipidemic and antiarteriosclerotic drugs, subsequent encounter: Secondary | ICD-10-CM

## 2024-01-04 MED ORDER — ICOSAPENT ETHYL 1 G PO CAPS: 2.0000 g | ORAL_CAPSULE | Freq: Two times a day (BID) | ORAL | 3 refills | Status: AC

## 2024-01-04 NOTE — Patient Instructions (Addendum)
 Medication Instructions:  START vascepa  2 capsules twice daily -- if too costly at retail pharmacy, [facility] is a mail order pharmacy option  START aspirin 81mg  daily  *If you need a refill on your cardiac medications before your next appointment, please call your pharmacy*  Lab Work: FASTING lab work in 3 months  If you have labs (blood work) drawn today and your tests are completely normal, you will receive your results only by: [employer] (if you have MyChart) OR A paper copy in the mail If you have any lab test that is abnormal or we need to change your treatment, we will call you to review the results.  Testing/Procedures: NONE  Follow-Up: At [facility], you and your health needs are our priority.  As part of our continuing mission to provide you with exceptional heart care, our providers are all part of one team.  This team includes your primary Cardiologist (physician) and Advanced Practice Providers or APPs (Physician Assistants and Nurse Practitioners) who all work together to provide you with the care you need, when you need it.  Your next appointment:    12 months with Rosaline Bane NP  We recommend signing up for the patient portal called MyChart.  Sign up information is provided on this After Visit Summary.  MyChart is used to connect with patients for Virtual Visits (Telemedicine).  Patients are able to view lab/test results, encounter notes, upcoming appointments, etc.  Non-urgent messages can be sent to your provider as well.   To learn more about what you can do with MyChart, go to [email or web address].

## 2024-01-04 NOTE — Progress Notes (Signed)
 LIPID CLINIC CONSULT NOTE  Chief Complaint:  Follow-up dyslipidemia  Primary Care Physician: Cleatus Arlyss RAMAN, MD  Primary Cardiologist:  None  HPI:  Joseph Drake is a 63 y.o. male who is being seen today for the evaluation of dyslipidemia at the request of Cleatus, Arlyss RAMAN, MD. this is a pleasant 63 year old male kindly referred for evaluation management of dyslipidemia.  He has a history of statin intolerance in the past and elevated cholesterol.  In December 2022 he had calcium scoring which was moderately elevated showing a calcium score 43, 63rd percentile for age and sex matched controls.  He was also noted to have some aortic atherosclerosis.  In July 2022 his direct LDL was 93, total cholesterol 139, triglycerides 219 and HDL 31.  He has not been on additional therapy.  He was briefly trialed on ezetimibe  but also had similar side effects to the medication.  01/31/2022  Joseph Drake returns today for follow-up.  He had been denied PCSK9 inhibitor by insurance as he was not felt that he had a familial hyperlipidemia as his LDL cholesterol was not over 190.  He does have very high cholesterol however and evidence of coronary artery calcification.  Unfortunately cannot tolerate statins or ezetimibe .  We had recommended Nexletol .  He took this for a short period of time and then developed some diarrhea.  He said after stopping the medicine it went away immediately.  He had not retrialed that.  Options are otherwise limited.  While it is likely that this could be the medicine, it is not a typical side effect.  Is also possible he could have had an infectious diarrhea that was self-limited.  I would advise him to retrial the medicine again and if he has side effects, obviously he would have to stop it.  We then need to consider another attempt at statin therapy, possibly Livalo .  08/18/2022  Joseph Drake is seen today in follow-up.  Although he has had reduction in his cholesterol with LDL  from 156 down to 80 and particle number down to 1547 from 2513 on Pitavastatin , he reports intolerance of the statin as well.  He had similar whole body aches which stopped when he stopped the medicine and then came back when he restarted it.  He also had similar reactions on rosuvastatin and simvastatin  as well as ezetimibe .  He is noted to have coronary artery calcification with a calcium score 43, 63rd percentile for age and sex matched control as well as aortic atherosclerosis.  He also has some peripheral arterial disease with findings of abdominal aortic atherosclerosis and branch vessel atherosclerosis seen on CT of the abdomen for hematuria workup back in 2021.  11/28/2022  Joseph Drake returns today for follow-up.  He had an excellent response to Repatha .  This is in addition to Zyptimag.  His LDL particle number is down now to 912, LDL-C of 63, triglycerides 233 and small LDL particle number of 791.  He seems to be tolerating this therapy well.  01/04/2024  Joseph Drake is seen today in follow-up.  He was noted to be tachycardic today of unknown etiology.  EKG shows a sinus tachycardia.  He was not rushed to get to the office.  He says he was working on his deck outside today.  Possibly might be somewhat dehydrated.  He had recent lipids in August showing total cholesterol 118, HDL 34, triglycerides 281 and LDL 17.  He has had an excellent response to Repatha  and is  tolerating it well.  Unfortunately his triglycerides remain elevated.  He does have ASCVD including PAD and an abnormal coronary calcium score which is elevated.  PMHx:  Past Medical History:  Diagnosis Date   Acid reflux    Allergy    BPH (benign prostatic hypertrophy)    Candida infection    Dyspnea    ED (erectile dysfunction)    Genital herpes    History of continuous positive airway pressure (CPAP) therapy    HTN (hypertension)    Hydronephrosis    Hyperlipidemia    Kidney stone on left side    LVH (left ventricular  hypertrophy)    Obesity    Palpitation    Holter in May 2014 with NSR, PVC, rare PACs   Palpitation    Sleep apnea    Testosterone  deficiency     Past Surgical History:  Procedure Laterality Date   CIRCUMCISION     NASAL SEPTUM SURGERY      FAMHx:  Family History  Problem Relation Age of Onset   Stroke Mother    Hyperlipidemia Mother    Hypertension Mother    Memory loss Mother    Hypertension Father    Hyperlipidemia Father    Colon cancer Paternal Uncle    Prostate cancer Neg Hx    Kidney disease Neg Hx    Allergic rhinitis Neg Hx    Asthma Neg Hx    Kidney cancer Neg Hx    Bladder Cancer Neg Hx     SOCHx:   reports that he has never smoked. He has never used smokeless tobacco. He reports current alcohol use. He reports that he does not use drugs.  ALLERGIES:  Allergies  Allergen Reactions   Nexletol  [Bempedoic Acid ]     GI upset   Niacin Itching   Niacin And Related Hives   Penicillins     rash   Pitavastatin      Causes body aches   Rosuvastatin     REACTION: myalgia   Simvastatin      Myalgias- happened with vytorin  but likely zetia  was not the issue.     Zetia  [Ezetimibe ]     aches    ROS: Pertinent items noted in HPI and remainder of comprehensive ROS otherwise negative.  HOME MEDS: Current Outpatient Medications on File Prior to Visit  Medication Sig Dispense Refill   benazepril  (LOTENSIN ) 5 MG tablet TAKE 1 TABLET BY MOUTH DAILY 90 tablet 3   cetirizine (ZYRTEC) 10 MG tablet Take 10 mg by mouth daily.     colchicine  0.6 MG tablet TAKE ONE TABLET BY MOUTH DAILY AS NEEDED FOR GOUT.  Okay to fill with mitigare  or colcrys  also. 30 tablet 2   cyclobenzaprine  (FLEXERIL ) 10 MG tablet Take 1 tablet (10 mg total) by mouth 3 (three) times daily as needed for muscle spasms. 30 tablet 0   escitalopram  (LEXAPRO ) 10 MG tablet TAKE 1 TABLET BY MOUTH DAILY 90 tablet 3   Evolocumab  (REPATHA  SURECLICK) 140 MG/ML SOAJ INJECT 140 MG INTO THE SKIN EVERY 14  (FOURTEEN) DAYS. 6 mL 1   fluticasone (FLONASE) 50 MCG/ACT nasal spray Place 2 sprays into both nostrils daily.     omeprazole  (PRILOSEC) 20 MG capsule Take 20 mg by mouth daily.     valACYclovir  (VALTREX ) 500 MG tablet TAKE 1 TABLET BY MOUTH DAILY 90 tablet 3   No current facility-administered medications on file prior to visit.    LABS/IMAGING: No results found for this or any previous visit (  from the past 48 hours). No results found.  LIPID PANEL:    Component Value Date/Time   CHOL 108 12/21/2023 0738   TRIG 281.0 (H) 12/21/2023 0738   TRIG 126 04/15/2006 1015   HDL 34.50 (L) 12/21/2023 0738   CHOLHDL 3 12/21/2023 0738   VLDL 56.2 (H) 12/21/2023 0738   LDLCALC 17 12/21/2023 0738   LDLDIRECT 136.0 06/17/2022 0931    WEIGHTS: Wt Readings from Last 3 Encounters:  01/04/24 229 lb 12.8 oz (104.2 kg)  12/31/23 231 lb 12.8 oz (105.1 kg)  09/04/23 227 lb (103 kg)    VITALS: BP 108/72   Pulse (!) 110   Ht 5' 9 (1.753 m)   Wt 229 lb 12.8 oz (104.2 kg)   SpO2 97%   BMI 33.94 kg/m   EXAM: Deferred  EKG: EKG Interpretation Date/Time:  Monday January 04 2024 14:40:19 EDT Ventricular Rate:  107 PR Interval:  138 QRS Duration:  94 QT Interval:  344 QTC Calculation: 459 R Axis:   67  Text Interpretation: Sinus tachycardia Compared to previous tracing the rate is faster Confirmed by Mona Kent [phone]) on 01/04/2024 3:12:06 PM    ASSESSMENT: Mixed dyslipidemia, goal LDL less than 70 Statin, bempedoic acid  and ezetimibe  intolerant-myalgias Abnormal CAC score of 43, 63rd percentile for age and sex matched control-aortic atherosclerosis (03/2021) PAD with abdominal aortic and branch vessel atherosclerosis seen by CT (08/2019)  PLAN: 1.   Joseph Drake has had a good response to Repatha  with an LDL now of 17 but still has persistently elevated triglycerides.  Given his history of CAD and PAD he is a good candidate to add Vascepa .  Would start 2 g twice daily.  Plan  repeat lipids in 3 to 4 months and otherwise follow-up with our lipid APP in 1 year.  Kent KYM Mona, MD, [facility], FNLA, FACP  [place]  [facility]  Medical Director of the Advanced Lipid Disorders &  Cardiovascular Risk Reduction Clinic Diplomate of the American Board of Clinical Lipidology Attending Cardiologist  Direct Dial: [phone]  Fax: [phone]  Website:  [email or web address]  Kent BROCKS Helmuth Recupero 01/04/2024, 3:12 PM

## 2024-01-05 ENCOUNTER — Other Ambulatory Visit (HOSPITAL_COMMUNITY): Payer: Self-pay

## 2024-01-05 ENCOUNTER — Telehealth: Payer: Self-pay

## 2024-01-05 NOTE — Telephone Encounter (Signed)
 Pharmacy Patient Advocate Encounter   Received notification from Physician's Office that prior authorization for VASCEPA  is required/requested.   Insurance verification completed.   The patient is insured through [facility] .   Per test claim: PA required; PA submitted to above mentioned insurance via Latent Key/confirmation #/[id number] Status is pending

## 2024-01-08 ENCOUNTER — Other Ambulatory Visit (HOSPITAL_COMMUNITY): Payer: Self-pay

## 2024-01-08 NOTE — Telephone Encounter (Signed)
 Pharmacy Patient Advocate Encounter  Received notification from [employer] that Prior Authorization for VASCEPA  has been APPROVED from 01/08/24 to 01/06/25. Ran test claim, Copay is $15. This test claim was processed through [facility]- copay amounts may vary at other pharmacies due to pharmacy/plan contracts, or as the patient moves through the different stages of their insurance plan.

## 2024-01-11 NOTE — Telephone Encounter (Signed)
 Update on Vascepa  sent to patient via MyChart

## 2024-01-14 ENCOUNTER — Ambulatory Visit: Admitting: Physical Therapy

## 2024-01-19 ENCOUNTER — Encounter: Payer: Self-pay | Admitting: Family Medicine

## 2024-01-20 ENCOUNTER — Other Ambulatory Visit: Payer: Self-pay | Admitting: Family Medicine

## 2024-01-20 DIAGNOSIS — E785 Hyperlipidemia, unspecified: Secondary | ICD-10-CM

## 2024-01-21 ENCOUNTER — Encounter: Admitting: Physical Therapy

## 2024-01-28 ENCOUNTER — Encounter: Admitting: Physical Therapy

## 2024-02-04 ENCOUNTER — Encounter: Admitting: Physical Therapy

## 2024-02-11 ENCOUNTER — Encounter: Admitting: Physical Therapy

## 2024-02-18 ENCOUNTER — Encounter: Admitting: Physical Therapy

## 2024-02-25 ENCOUNTER — Encounter: Admitting: Physical Therapy

## 2024-03-03 ENCOUNTER — Encounter: Admitting: Physical Therapy

## 2024-03-10 ENCOUNTER — Encounter: Admitting: Physical Therapy

## 2024-04-01 ENCOUNTER — Ambulatory Visit: Admitting: Family Medicine

## 2024-04-01 ENCOUNTER — Other Ambulatory Visit

## 2024-04-01 DIAGNOSIS — E119 Type 2 diabetes mellitus without complications: Secondary | ICD-10-CM

## 2024-04-01 DIAGNOSIS — E785 Hyperlipidemia, unspecified: Secondary | ICD-10-CM | POA: Diagnosis not present

## 2024-04-01 LAB — COMPREHENSIVE METABOLIC PANEL WITH GFR
ALT: 35 U/L (ref 0–53)
AST: 23 U/L (ref 0–37)
Albumin: 4.3 g/dL (ref 3.5–5.2)
Alkaline Phosphatase: 74 U/L (ref 39–117)
BUN: 19 mg/dL (ref 6–23)
CO2: 29 meq/L (ref 19–32)
Calcium: 9.3 mg/dL (ref 8.4–10.5)
Chloride: 101 meq/L (ref 96–112)
Creatinine, Ser: 1.07 mg/dL (ref 0.40–1.50)
GFR: 73.34 mL/min (ref >60.00)
Glucose, Bld: 103 mg/dL — ABNORMAL HIGH (ref 70–99)
Potassium: 4.4 meq/L (ref 3.5–5.1)
Sodium: 139 meq/L (ref 135–145)
Total Bilirubin: 0.6 mg/dL (ref 0.2–1.2)
Total Protein: 6.9 g/dL (ref 6.0–8.3)

## 2024-04-01 LAB — HEMOGLOBIN A1C: Hgb A1c MFr Bld: 5.7 % (ref 4.6–6.5)

## 2024-04-01 LAB — MICROALBUMIN / CREATININE URINE RATIO
Creatinine,U: 117.3 mg/dL
Microalb Creat Ratio: UNDETERMINED mg/g (ref 0.0–30.0)
Microalb, Ur: <0.7 mg/dL

## 2024-04-02 LAB — NMR, LIPOPROFILE
Cholesterol, Total: 112 mg/dL (ref 100–199)
HDL Particle Number: 30.9 umol/L (ref >=30.5)
HDL-C: 35 mg/dL — ABNORMAL LOW (ref >39)
LDL Particle Number: 626 nmol/L (ref <1000)
LDL Size: 20.1 nm — ABNORMAL LOW (ref >20.5)
LDL-C (NIH Calc): 52 mg/dL (ref 0–99)
LP-IR Score: 75 — ABNORMAL HIGH (ref <=45)
Small LDL Particle Number: 363 nmol/L (ref <=527)
Triglycerides: 143 mg/dL (ref 0–149)

## 2024-04-03 ENCOUNTER — Ambulatory Visit: Payer: Self-pay | Admitting: Family Medicine

## 2024-04-05 ENCOUNTER — Encounter: Payer: Self-pay | Admitting: Internal Medicine

## 2024-04-07 ENCOUNTER — Encounter: Payer: Self-pay | Admitting: Family Medicine

## 2024-04-07 ENCOUNTER — Ambulatory Visit: Admitting: Family Medicine

## 2024-04-07 VITALS — BP 110/78 | HR 89 | Temp 98.9°F | Ht 69.0 in | Wt 216.5 lb

## 2024-04-07 DIAGNOSIS — Z8639 Personal history of other endocrine, nutritional and metabolic disease: Secondary | ICD-10-CM

## 2024-04-07 NOTE — Patient Instructions (Addendum)
 Recheck A1c in about 3 months.  We can do that at the visit.  You don't need to fast.  Take care.  Glad to see you. Thanks for your effort.  Cut the benazepril  in half if you are lightheaded.

## 2024-04-07 NOTE — Progress Notes (Unsigned)
 H/o DM2.   No meds.  Hypoglycemic episodes: no Hyperglycemic episodes: no Feet problems: no tingling.  He had some longstanding foot pain from OA.   Blood Sugars averaging: not checked.  eye exam within last year: yes, Dr. Kennyth at [facility] A1c lower and MALB neg.  Lipids improved from prior results.  D/w pt.    He cut out sugars and has been working on exercise.    15lbs weight loss, intentional, from last OV.    Meds, vitals, and allergies reviewed.   ROS: Per HPI unless specifically indicated in ROS section   GEN: nad, alert and oriented HEENT: mucous membranes moist NECK: supple w/o LA CV: rrr. PULM: ctab, no inc wob ABD: soft, +bs EXT: no edema SKIN: well perfused.

## 2024-04-10 NOTE — Assessment & Plan Note (Signed)
 A1c lower and MALB neg.  Lipids improved from prior results.  D/w pt.    He cut out sugars and has been working on exercise.    15lbs weight loss, intentional, from last OV.    Recheck A1c in about 3 months.  See AVS.  I thanked him for his effort.

## 2024-05-18 ENCOUNTER — Other Ambulatory Visit: Payer: Self-pay | Admitting: Internal Medicine

## 2024-07-07 ENCOUNTER — Ambulatory Visit: Admitting: Family Medicine
